# Patient Record
Sex: Female | Born: 1975 | Race: White | Hispanic: No | Smoking: Never smoker
Health system: Southern US, Community
[De-identification: ages and names within clinical notes are randomized; demographics above are authoritative.]

## PROBLEM LIST (undated history)

## (undated) DIAGNOSIS — M1990 Unspecified osteoarthritis, unspecified site: Secondary | ICD-10-CM

## (undated) DIAGNOSIS — R112 Nausea with vomiting, unspecified: Secondary | ICD-10-CM

## (undated) DIAGNOSIS — T7840XA Allergy, unspecified, initial encounter: Secondary | ICD-10-CM

## (undated) DIAGNOSIS — G35 Multiple sclerosis: Secondary | ICD-10-CM

## (undated) DIAGNOSIS — Z9484 Stem cells transplant status: Secondary | ICD-10-CM

## (undated) DIAGNOSIS — Z5189 Encounter for other specified aftercare: Secondary | ICD-10-CM

## (undated) DIAGNOSIS — Z9889 Other specified postprocedural states: Secondary | ICD-10-CM

## (undated) DIAGNOSIS — N3281 Overactive bladder: Secondary | ICD-10-CM

## (undated) DIAGNOSIS — G709 Myoneural disorder, unspecified: Secondary | ICD-10-CM

## (undated) HISTORY — DX: Allergy, unspecified, initial encounter: T78.40XA

## (undated) HISTORY — DX: Unspecified osteoarthritis, unspecified site: M19.90

## (undated) HISTORY — PX: HERNIA REPAIR: SHX51

## (undated) HISTORY — PX: KNEE ARTHROSCOPY W/ MEDIAL COLLATERAL LIGAMENT (MCL) REPAIR: SHX1876

## (undated) HISTORY — DX: Multiple sclerosis: G35

## (undated) HISTORY — DX: Myoneural disorder, unspecified: G70.9

## (undated) HISTORY — DX: Encounter for other specified aftercare: Z51.89

## (undated) HISTORY — PX: TUBAL LIGATION: SHX77

## (undated) HISTORY — PX: OTHER SURGICAL HISTORY: SHX169

---
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
## 2002-11-11 ENCOUNTER — Encounter: Payer: Self-pay | Admitting: Neurology

## 2002-11-11 ENCOUNTER — Ambulatory Visit (HOSPITAL_COMMUNITY): Admission: RE | Admit: 2002-11-11 | Discharge: 2002-11-11 | Payer: Self-pay | Admitting: Neurology

## 2002-11-15 ENCOUNTER — Encounter: Payer: Self-pay | Admitting: Neurology

## 2002-11-15 ENCOUNTER — Encounter: Admission: RE | Admit: 2002-11-15 | Discharge: 2002-11-15 | Payer: Self-pay | Admitting: Neurology

## 2007-01-31 ENCOUNTER — Ambulatory Visit: Payer: Self-pay | Admitting: Obstetrics and Gynecology

## 2008-02-06 ENCOUNTER — Ambulatory Visit: Payer: Self-pay | Admitting: Obstetrics and Gynecology

## 2008-07-22 ENCOUNTER — Encounter: Admission: RE | Admit: 2008-07-22 | Discharge: 2008-07-22 | Payer: Self-pay | Admitting: Neurology

## 2008-07-24 ENCOUNTER — Encounter: Payer: Self-pay | Admitting: Internal Medicine

## 2008-07-24 ENCOUNTER — Ambulatory Visit: Payer: Self-pay | Admitting: Internal Medicine

## 2008-07-24 DIAGNOSIS — R0602 Shortness of breath: Secondary | ICD-10-CM | POA: Insufficient documentation

## 2008-07-31 ENCOUNTER — Telehealth (INDEPENDENT_AMBULATORY_CARE_PROVIDER_SITE_OTHER): Payer: Self-pay | Admitting: Radiology

## 2009-03-17 ENCOUNTER — Encounter: Admission: RE | Admit: 2009-03-17 | Discharge: 2009-03-17 | Payer: Self-pay | Admitting: Neurology

## 2010-03-30 NOTE — Progress Notes (Signed)
Summary: Cancelled Myoview  Phone Note Call from Patient   Caller: Patient Summary of Call: Pt. called and cancelled Stress myoview 07/31/08. She said She didn't need study anymore. Initial call taken by: Lynford Citizen

## 2010-03-30 NOTE — Assessment & Plan Note (Signed)
Summary: [facility]   Referring Provider:  Avie Echevaria   History of Present Illness: 35 y/o woman with h/o morbid obesity and multiple sclerosis referred for further evaluation of CP.  Diagnosed with MS in September 2004. Referred by Dr. Sandria Manly for further evaluation of CP.  2 weeks ago was "fussing at kids" and developed wave of pressure from waist up through chest which dissipated rapidly. The next day had similar episode. Over the past week she feels full and tight through waist and chest. Feels full. Not worse with exertion. No diaphoresis. + SOB. Had CXR which was normal. No cough, fever, chills.  Under lots of stress. Walks every day without problem. Has started weight watchers and lost 30 pounds.  Recently R leg is getting a little heavy and questioning whether she is starting a flare. Wondering if CP may be an "MS hug."   No h/o diabetes, HTN or HL. Does have FHX of CAD.  Current Medications (verified): 1)  Oxybutynin Chloride 5 Mg Tabs (Oxybutynin Chloride) .... Three Times A Day 2)  Rebif 44 Mcg/0.7ml Soln (Interferon Beta-1a) .Marland Kitchen.. 1 Inj Three Times A Week  Allergies (verified): No Known Drug Allergies  Past History:  Past Medical History: 1. Multiple sclerosis    --c/b Tranverse myelopathy and optic neuritis 2. h/o sterile abscess in R abdomen 3. Dystonic bladder  Past Surgical History: c- sections x 2 1999 & 2003 tubes tied hernia repair   Family History: Family History of Cancer:  Family History of Coronary Artery Disease:  Family History of Diabetes:  F had mi x2 at 39,46 now s/p CABG M 59  breast cancer B 31 healthy  Social History: Reviewed history and no changes required. Married  Tobacco Use - No.  Alcohol Use - no Regular Exercise - yes Drug Use - no Smoking Status:  never Does Patient Exercise:  yes Drug Use:  no  Review of Systems       As per HPI and past medical history; otherwise all systems negative.   Vital Signs:  Patient  profile:   35 year old female Height:      67 inches Weight:      245 pounds BMI:     38.51 Pulse rate:   73 / minute BP sitting:   98 / 68  (right arm) Cuff size:   large  Vitals Entered By: Hardin Negus, RMA (Jul 24, 2008 3:32 PM)  Physical Exam  General:  Gen: well appearing. no resp difficulty HEENT: normal Neck: supple. no JVD. Carotids 2+ bilat; not bruits. No lymphadenopathy or thryomegaly appreciated. Cor: PMI nondisplaced. Regular rate & rhythm. No rubs, gallops, murmur. Lungs: clear Abdomen: soft, nontender, nondistended. No hepatosplenomegaly. No bruits or masses. Good bowel sounds. Extremities: no cyanosis, clubbing, rash, edema Neuro: alert & orientedx3, cranial nerves grossly intact. moves all 4 extremities w/o difficulty. affect pleasant    Impression & Recommendations:  Problem # 1:  CHEST TIGHTNESS-PRESSURE-OTHER ([id number]) I suspect that she is having "MS hug" type symptoms but given family history I think it is reasonable to rule out any sturctural heart problems or ischemia. Will plan 2-D echo and treadmill/myoview.   Problem # 2:  DYSPNEA (ICD-786.05) As above.

## 2010-08-19 ENCOUNTER — Encounter: Payer: Self-pay | Admitting: Cardiovascular Disease

## 2011-01-07 ENCOUNTER — Ambulatory Visit: Payer: Self-pay | Admitting: Obstetrics and Gynecology

## 2012-01-25 ENCOUNTER — Ambulatory Visit: Payer: Self-pay | Admitting: Obstetrics and Gynecology

## 2012-05-31 DIAGNOSIS — G35 Multiple sclerosis: Secondary | ICD-10-CM | POA: Insufficient documentation

## 2012-06-01 ENCOUNTER — Encounter: Payer: Self-pay | Admitting: Diagnostic Neuroimaging

## 2012-06-01 ENCOUNTER — Ambulatory Visit (INDEPENDENT_AMBULATORY_CARE_PROVIDER_SITE_OTHER): Payer: BC Managed Care – PPO | Admitting: Diagnostic Neuroimaging

## 2012-06-01 VITALS — BP 91/64 | HR 70 | Temp 97.0°F | Ht 67.0 in | Wt 218.0 lb

## 2012-06-01 DIAGNOSIS — G35 Multiple sclerosis: Secondary | ICD-10-CM

## 2012-06-01 NOTE — Patient Instructions (Addendum)
I will check MRI brain. Then we will consider staying on rebif vs. changing to gilenya or tecfidera.

## 2012-06-01 NOTE — Progress Notes (Signed)
[facility]  PATIENT: Christy Cox DOB: 01/14/1976  REFERRING CLINICIAN:  HISTORY FROM: patient REASON FOR VISIT: follow up   HISTORICAL  CHIEF COMPLAINT:  Chief Complaint  Patient presents with  . Follow-up    annual     HISTORY OF PRESENT ILLNESS:   UPDATE 06/01/12: patient presented for annual followup visit for multiple sclerosis. I reviewed prior notes from my colleague Dr. Sandria Manly. Patient is doing well. She continues on rebf injections, but tells me that she is "tired of injections". We discussed oral MS medications. Patient's last neuroimaging studies were in 2004. She's had a fairly stable course since that time. In January 2014, patient was having some right eye painful symptoms without objective evidence of optic neuritis. Therefore she was not started on steroids. Symptoms last for one week and then resolved. Overall patient doing well. Continues to have some right leg weakness and mild gait difficulty. Her urinary control problems are her most severe symptoms at this time, and she is seeing a urologist for management.  PRIOR HPI (03/09/12; Dr. Sandria Manly): 37 year old right-handed white married female from [place], [name] with multiple sclerosis. 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  She then had an MRI of the brain and of the cervical cord in 05/02/2002 showing multiple lesions in the brain some of which enhanced  and similar lesions in the  cervical spine, none of which enhanced.  She has been on  Rebif since October 2004 and has done very well.  She  had courses of high dose IV Solu-Medrol followed by oral prednisone for numbness in her hands, the last 09/2009.  She  had side effects from the Rebif with a sterile abscess in her right abdomen for which she underwent surgery and had good results.  She  has complained of her feet hurting and numbness in her feet in the past.  She is currently on disability.  She  had an episode of optic neuritis  04/13/2004 evaluated by Dr. Lemar Lofty in [place].  She was off Rebif for about 9 months due to financial issues and restarted in February, 2010. 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 She has stopped using  a tanning bed. Her last MRIs of the cervical spine and brain were 05/02/2002.  In May 2010 she developed chest pain and was evaluated with chest x-ray, 2-D echocardiogram, a 12-lead EKG which were normal. March 03, 2009 she fell in her bedroom  and  fractured her right tibia. She had symptoms of a UTI with frequency and lower abdominal pain. She was initially placed on Cipro for 5 days, but had a resistant organism and then was placed on Macrodantin. She developed elevated liver function tests with jaundice and  a bilirubin of 7.2   She  was seen by Dr. Kinnie Scales who recognized her symptoms were secondary to Macrodantin and her liver dysfunction cleared. Abdominal ultrasound 03/17/2009 showed multiple gallstones. She has nocturia every 1/2 hour and is followed by Dr. Achilles Dunk. She has tried Imipramine and Oxybutynin. She got relief from [employer] which her insurance will not cover.She denies injections problems with Rebif.She had plantar fasciitis on the right  foot.  One week ago she had cold like symptoms. 03/07/2012 she developed right eye pain when moving her eye.  There was no loss of vision.  She treated this with Excedrin.  Today it is not as sore. Her neurologic review systems is otherwise negative.  REVIEW OF SYSTEMS: Full 14 system review of systems performed and notable only for easy bruising, urination problems, lack of sleep.  ALLERGIES: Allergies  Allergen Reactions  . Macrodantin (Nitrofurantoin Macrocrystal)      Caused elevated liver function and jaundice    HOME MEDICATIONS: Outpatient Prescriptions Prior to Visit  Medication Sig Dispense Refill  . interferon beta-1a (REBIF) 44 MCG/0.5ML injection Inject 44 mcg into the skin 3 (three) times a week.        Marland Kitchen oxybutynin (DITROPAN) 5 MG tablet Take 5 mg by mouth 3 (three) times daily.         No facility-administered medications prior to visit.    PAST MEDICAL HISTORY: Past Medical History  Diagnosis Date  . Abscess, abdomen     H/o sterile abscess in right abd  . Bladder disorder, other     Dystonic bladder  . Multiple sclerosis     c/b transverse myelitis and optic neuritis    PAST SURGICAL HISTORY: Past Surgical History  Procedure Laterality Date  . Cesarean section  1999, 2003  . Tubes tied    . Hernia repair      FAMILY HISTORY: Family History  Problem Relation Age of Onset  . Cancer Mother 25    Breast  . Heart attack Father 70    x2 at 56 them at 70, now s/p CABG  . Cancer Other   . Coronary artery disease Other   . Diabetes Other     SOCIAL HISTORY:  History   Social History  . Marital Status: Married    Spouse Name: N/A    Number of Children: 2  . Years of Education: College   Occupational History  . n/a     disabled   Social History Main Topics  . Smoking status: Never Smoker   . Smokeless tobacco: Not on file  . Alcohol Use: No  . Drug Use: No  . Sexually Active: Not on file   Other Topics Concern  . Not on file   Social History Narrative   Pt lives at home with her spouse.    Caffeine Use- 1-2 per week     PHYSICAL EXAM  Filed Vitals:   06/01/12 1059  BP: 91/64  Pulse: 70  Temp: 97 F (36.1 C)  TempSrc: Oral  Height: 5\' 7"  (1.702 m)  Weight: 218 lb (98.884 kg)   Body mass index is 34.14 kg/(m^2).  GENERAL EXAM: Patient is in no distress  CARDIOVASCULAR: Regular rate and rhythm, no murmurs, no carotid bruits  NEUROLOGIC: MENTAL STATUS: awake, alert, language fluent,  comprehension intact, naming intact CRANIAL NERVE: no papilledema on fundoscopic exam, pupils equal and reactive to light, visual fields full to confrontation, extraocular muscles intact, no nystagmus, facial sensation and strength symmetric, uvula midline, shoulder shrug symmetric, tongue midline. MOTOR: normal bulk and tone, full strength in the BUE, LLE; RIGHT KNEE FLEX 4/5, ANKLE DF 4/5.  SENSORY: normal and symmetric to light touch, pinprick, temperature, vibration COORDINATION: finger-nose-finger, fine finger movements normal REFLEXES: deep tendon reflexes present and symmetric GAIT/STATION: narrow based gait; SLIGHT LIMP WITH RIGHT LEG.   DIAGNOSTIC DATA (LABS, IMAGING, TESTING) - I reviewed patient records, labs, notes, testing and imaging  myself where available.  No results found for this basename: WBC, HGB, HCT, MCV, PLT   No results found for this basename: na, k, cl, co2, glucose, bun, creatinine, calcium, prot, albumin, ast, alt, alkphos, bilitot, gfrnonaa, gfraa   No results found for this basename: CHOL, HDL, LDLCALC, LDLDIRECT, TRIG, CHOLHDL   No results found for this basename: HGBA1C   No results found for this basename: VITAMINB12   No results found for this basename: TSH     ASSESSMENT AND PLAN  37 y.o. year old female  has a past medical history of Abscess, abdomen; Bladder disorder, other; and Multiple sclerosis. here for follow up. I will check MRI of the brain to establish new baseline imaging. Last imaging was done at [facility], so we will try to obtain old films for comparison. Imaging appears stable, we may continued her on rebif. If there is significant progression, or patient decides to change medications, I would recommend Gilenya or tecfidera.   Orders Placed This Encounter  Procedures  . MR Brain W Wo Contrast    Suanne Marker, MD 06/01/2012, 11:17 AM Certified in Neurology, Neurophysiology and Neuroimaging  [facility] [address] [place], [name] [address] [phone]

## 2012-06-06 DIAGNOSIS — N390 Urinary tract infection, site not specified: Secondary | ICD-10-CM | POA: Insufficient documentation

## 2012-06-29 ENCOUNTER — Other Ambulatory Visit: Payer: Self-pay | Admitting: Diagnostic Neuroimaging

## 2012-08-01 ENCOUNTER — Ambulatory Visit (INDEPENDENT_AMBULATORY_CARE_PROVIDER_SITE_OTHER): Payer: BC Managed Care – PPO

## 2012-08-01 ENCOUNTER — Other Ambulatory Visit: Payer: Self-pay

## 2012-08-01 DIAGNOSIS — G35 Multiple sclerosis: Secondary | ICD-10-CM

## 2012-08-01 MED ORDER — GABAPENTIN 100 MG PO CAPS: 200.0000 mg | ORAL_CAPSULE | Freq: Every evening | ORAL | Status: DC

## 2012-08-02 MED ORDER — GADOPENTETATE DIMEGLUMINE 469.01 MG/ML IV SOLN: 20.0000 mL | Freq: Once | INTRAVENOUS | Status: AC | PRN

## 2012-08-06 ENCOUNTER — Telehealth: Payer: Self-pay | Admitting: Diagnostic Neuroimaging

## 2012-08-06 NOTE — Telephone Encounter (Signed)
I called pt and let her know the results of MRI.  She would like to speak to Dr. Marjory Lies.   I do not know about looking at old films (2004), if they are even here.   She may inquire at [facility], if they have 2004.  I relayed about changing to new medication (gilenya or tecfidera).

## 2012-08-07 NOTE — Telephone Encounter (Signed)
I called patient and reviewed options. She will think about it. -VRP

## 2012-10-16 ENCOUNTER — Other Ambulatory Visit: Payer: Self-pay

## 2012-10-16 MED ORDER — INTERFERON BETA-1A 44 MCG/0.5ML ~~LOC~~ SOLN: 44.0000 ug | SUBCUTANEOUS | Status: DC

## 2012-10-31 ENCOUNTER — Telehealth: Payer: Self-pay | Admitting: Diagnostic Neuroimaging

## 2012-11-01 NOTE — Telephone Encounter (Signed)
Patient states from her waist down she feels "heavy" or numb. The feeling is more so on her R side. She states when she had these episodes in the past Dr. Sandria Manly would recommend steroids. Patient is unable to come in for OV today because leaving to go out of town. Will not return until Sunday. Would like to notify Dr. Marjory Lies and ask for advice.

## 2012-11-05 ENCOUNTER — Telehealth: Payer: Self-pay | Admitting: Diagnostic Neuroimaging

## 2012-11-05 NOTE — Telephone Encounter (Signed)
I talked to patient on call re: numbness from waist down since last week, she states, she called last week, but has not heard back and she feels, w each day, she is getting worse. She has numbness and mild weakness, but can still walk. Her legs feel heavy and she has trouble putting pants on. She is afraid of falling, but has not fallen. I explained to her she would likely need another MRI of the T and L spine, and that she could either go to the ER at [facility], where she would likely get a scan and possible treatment with steroids if indicated. She could also wait till AM and I would get my message to Dr. Demetrius Charity and also discuss plan of action with him in AM. She stated, that she could wait another 12 hours, as she has waited so long, however, she is advised to go to the ER or call 911, if she had sudden loss or worsening of motor function. She demonstrated understanding and voiced agreement. She was upset, that she had not heard back from the office in days, but was grateful for the call back after hours. I reassured her that I would talk to Dr. Demetrius Charity in AM.

## 2012-11-06 NOTE — Telephone Encounter (Signed)
Patient scheduled to see Heide Guile, NP tomorrow at 9:00 a.m.

## 2012-11-06 NOTE — Telephone Encounter (Signed)
I called patient. More numbness, weakness, waist down. She will come in tomorrow at 9am, on Lynn's schedule. Possible labs, IV steroids and MRI planned. -VRP

## 2012-11-07 ENCOUNTER — Ambulatory Visit (INDEPENDENT_AMBULATORY_CARE_PROVIDER_SITE_OTHER): Payer: BC Managed Care – PPO | Admitting: Nurse Practitioner

## 2012-11-07 ENCOUNTER — Ambulatory Visit (INDEPENDENT_AMBULATORY_CARE_PROVIDER_SITE_OTHER): Payer: BC Managed Care – PPO | Admitting: *Deleted

## 2012-11-07 ENCOUNTER — Telehealth: Payer: Self-pay | Admitting: Diagnostic Neuroimaging

## 2012-11-07 ENCOUNTER — Encounter: Payer: Self-pay | Admitting: Nurse Practitioner

## 2012-11-07 VITALS — BP 113/82 | HR 112 | Temp 98.1°F | Ht 67.0 in | Wt 219.0 lb

## 2012-11-07 DIAGNOSIS — G35 Multiple sclerosis: Secondary | ICD-10-CM

## 2012-11-07 MED ORDER — SODIUM CHLORIDE 0.9 % IV SOLN
1000.0000 mg | INTRAVENOUS | Status: DC
  Administered 2012-11-07: 1000 mg via INTRAVENOUS

## 2012-11-07 MED ORDER — PREDNISONE 10 MG PO TABS: 10.0000 mg | ORAL_TABLET | Freq: Every day | ORAL | Status: DC

## 2012-11-07 NOTE — Patient Instructions (Addendum)
SoluMedrol infusion today and then x 4 days at home. Prednisone Oral Taper for 12 days:  Take 1 tablet (10 mg total) by mouth daily. 12 day Taper. Day 1-2, 60 mg (6 tabs), Day 3-4, 50 mg (5 tabs), Day 5-6, 40 mg (4 tabs), Days 7-8 30 mg (3 tabs), Days 9-10, 20 mg (2 Tabs), Days 11-12, 10 mg (1 Tab).   Please supplement your diet with Potassium rich foods while on prednisone.  Add a banana each day. Take an over-the-counter antacid such as Prevacid daily also.  We will order MRI of Brain (Repeat) and MRI Thoracic Spine. CBC, CMP and JC Virus Antibody, Urinalysis today.  Follow up as scheduled.

## 2012-11-07 NOTE — Telephone Encounter (Signed)
Patient also called and left a message on my VM.  The nurses do the IV Solumedrol.  Andrey Campanile has sent the order to [facility] for home infusion.  She is contacting the patient to discuss this matter.

## 2012-11-07 NOTE — Progress Notes (Signed)
Pt here for appt with Heide Guile, NP/ Dr. Marjory Lies.  Order for Solumedrol 1000mg  IV daily for 5 days.   Will give first dose today in office.   Under aseptic technique 22g angiocath inserted to R inner forearm with good blood return.  (some bruising noted).  IVF ( 0.9% NS / 1000mg  Solumedrol) infusion started at 1043.  Pt made comfortable , reclining chair, lights dimmed.   Pt anxious and did experience some feelings of nausea.  These did subside after a while.  She is very anxious re: IV's .  Monitored her IV site, which remained soft, warm, no c/o pain.  IV finished 1043, flushed with NS , and NSL im place.  Taped and wrapped with kerlix and self adherenct bandage.  Pt instructed that if too loose or tight can remove and then replace.   Wrap with cellophane wrap for shower and try to keep dry.  To use this for next day's infusions.  Pt verbalized instructions.

## 2012-11-07 NOTE — Patient Instructions (Signed)
Pt to continue IV infusions Day 2 thru Day 5 via [facility] - [facility].

## 2012-11-07 NOTE — Progress Notes (Signed)
[facility]  PATIENT: Christy Cox DOB: 1975/05/24   REASON FOR VISIT: follow up HISTORY FROM: patient  HISTORY OF PRESENT ILLNESS: UPDATE 11/07/12 (LL): Patient calls for acute visit.  She has been experiencing increased lower extremity weakness from the waist down since last Wednesday.  He arms have not been affected.  She denies recent illness, fevers, UTI symptoms, or cold symptoms.  She denies falling but feels very unsteady.  UPDATE 06/01/12 (VRP): patient presented for annual followup visit for multiple sclerosis. I reviewed prior notes from my colleague Dr. Sandria Cox. Patient is doing well. She continues on rebf injections, but tells me that she is "tired of injections". We discussed oral MS medications. Patient's last neuroimaging studies were in 2004. She's had a fairly stable course since that time. In January 2014, patient was having some right eye painful symptoms without objective evidence of optic neuritis. Therefore she was not started on steroids. Symptoms last for one week and then resolved. Overall patient doing well. Continues to have some right leg weakness and mild gait difficulty. Her urinary control problems are her most severe symptoms at this time, and she is seeing a urologist for management.   PRIOR HPI (03/09/12; Dr. Sandria Cox): 37 year old right-handed white married female from [place], [name] with multiple sclerosis. 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 She then had an MRI of the brain and of the cervical cord in 05/02/2002 showing multiple lesions in the brain some of which enhanced and similar lesions in the cervical spine, none of which enhanced. She has been on Rebif since October 2004 and has done very well. She had courses of high dose IV Solu-Medrol followed by oral prednisone for numbness  in her hands, the last 09/2009. She had side effects from the Rebif with a sterile abscess in her right abdomen for which she underwent surgery and had good results. She has complained of her feet hurting and numbness in her feet in the past. She is currently on disability. She had an episode of optic neuritis 04/13/2004 evaluated by Dr. Lemar Cox in [place]. She was off Rebif for about 9 months due to financial issues and restarted in February, 2010. 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 She has stopped using a tanning bed. Her last MRIs of the cervical spine and brain were 05/02/2002. In May 2010 she developed chest pain and was evaluated with chest x-ray, 2-D echocardiogram, a 12-lead EKG which were normal. March 03, 2009 she fell in her bedroom and fractured her right tibia. She had symptoms of a UTI with frequency and lower abdominal pain. She was initially placed on Cipro for 5 days, but had a resistant organism and then was placed on Macrodantin. She developed elevated liver function tests with jaundice and a bilirubin of 7.2  She was seen by Dr. Kinnie Cox who recognized her symptoms were secondary to Macrodantin and her liver dysfunction cleared. Abdominal ultrasound 03/17/2009 showed multiple gallstones. She has nocturia every 1/2 hour and is followed by Dr. Achilles Cox. She has tried Imipramine and Oxybutynin. She got relief from VESIcare which her insurance will not cover.She denies injections problems with Rebif.She had plantar  fasciitis on the right foot. One week ago she had cold like symptoms. 03/07/2012 she developed right eye pain when moving her eye. There was no loss of vision. She treated this with Excedrin. Today it is not as sore. Her neurologic review systems is otherwise negative.  REVIEW OF SYSTEMS: Full 14 system review of systems performed and notable only  for:  Constitutional: N/A  Cardiovascular: N/A  Ear/Nose/Throat: N/A  Skin: N/A  Eyes: N/A  Respiratory: N/A  Gastroitestinal: N/A  Genitourinary: N/A Hematology/Lymphatic: N/A  Endocrine: N/A Musculoskeletal:N/A  Allergy/Immunology: N/A  Neurological: numbness, weakness Psychiatric: N/A Sleep: insomnia   ALLERGIES: Allergies  Allergen Reactions  . Macrodantin [Nitrofurantoin Macrocrystal]     Caused elevated liver function and jaundice    HOME MEDICATIONS: Outpatient Prescriptions Prior to Visit  Medication Sig Dispense Refill  . cyclobenzaprine (FLEXERIL) 5 MG tablet TAKE 1 TABLET AT BEDTIME  30 tablet  5  . gabapentin (NEURONTIN) 100 MG capsule Take 2 capsules (200 mg total) by mouth every evening.  60 capsule  6  . imipramine (TOFRANIL) 25 MG tablet Take 25 mg by mouth at bedtime.      . interferon beta-1a (REBIF) 44 MCG/0.5ML injection Inject 0.5 mL (44 mcg total) into the skin 3 (three) times a week.  18 mL  0  . oxybutynin (DITROPAN) 5 MG tablet Take 5 mg by mouth 3 (three) times daily.        . solifenacin (VESICARE) 10 MG tablet Take by mouth daily.       No facility-administered medications prior to visit.    PAST MEDICAL HISTORY: Past Medical History  Diagnosis Date  . Abscess, abdomen     H/o sterile abscess in right abd  . Bladder disorder, other     Dystonic bladder  . Multiple sclerosis     c/b transverse myelitis and optic neuritis    PAST SURGICAL HISTORY: Past Surgical History  Procedure Laterality Date  . Cesarean section  1999, 2003  . Tubes tied    . Hernia repair      FAMILY HISTORY: Family History  Problem Relation Age of Onset  . Cancer Mother 33    Breast  . Heart attack Father 91    x2 at 32 them at 45, now s/p CABG  . Cancer Other   . Coronary artery disease Other   . Diabetes Other     SOCIAL HISTORY: History   Social History  . Marital Status: Married    Spouse Name: dennis    Number of Children: 2  . Years of  Education: College   Occupational History  . n/a     disabled   Social History Main Topics  . Smoking status: Never Smoker   . Smokeless tobacco: Not on file  . Alcohol Use: No  . Drug Use: No  . Sexual Activity: Not on file   Other Topics Concern  . Not on file   Social History Narrative   Pt lives at home with her spouse.    Caffeine Use- 1-2 per week     PHYSICAL EXAM  Filed Vitals:   11/07/12 0916  BP: 113/82  Pulse: 112  Temp: 98.1 F (36.7 C)  TempSrc: Oral  Height: 5\' 7"  (1.702 m)  Weight: 219 lb (99.338 kg)   Body mass index is 34.29 kg/(m^2).  Generalized: Well developed, in no acute distress  Head: normocephalic and atraumatic. Oropharynx benign  Neck: Supple, no carotid bruits  Cardiac: Regular rate rhythm,  no murmur  Musculoskeletal: No deformity   NEUROLOGIC:  MENTAL STATUS: awake, alert, language fluent, comprehension intact, naming intact  CRANIAL NERVE: no papilledema on fundoscopic exam, pupils equal and reactive to light, visual fields full to confrontation, extraocular muscles intact, no nystagmus, facial sensation and strength symmetric, uvula midline, shoulder shrug symmetric, tongue midline.  MOTOR: normal bulk and tone, full strength in the BUE, LLE; RIGHT KNEE FLEX 3/5, ANKLE DF 4/5.  SENSORY: NUMBNESS AND TINGLING FROM T5 LEVEL DOWN.  COORDINATION: finger-nose-finger, fine finger movements normal  REFLEXES: deep tendon reflexes present and symmetric  GAIT/STATION: narrow based gait; SLIGHT LIMP WITH RIGHT LEG.  ROMBERG POSITIVE. TANDEM UNSTEADY.  DIAGNOSTIC DATA (LABS, IMAGING, TESTING) - I reviewed patient records, labs, notes, testing and imaging myself where available.  08/03/12 MRI BRAIN W/Wo Abnormal MRI brain (with and without) demonstrating:Multiple round and ovoid, periventricular, subcortical, juxtacortical and right middle cerebellar peduncle chronic demyelinating plaques. There is a right mesial temporal enhancing lesion, not  seen on axial views, and may be acute plaque vs. artifact.   ASSESSMENT AND PLAN 37 y.o. year old female has a past medical history of Abscess, abdomen; Bladder disorder, and Multiple sclerosis here for worsening LE weakness and numbness. I will check MRI of the T and L spine to establish new baseline imaging. Last imaging was done at [facility], have not been able to obtain old films for comparison. If imaging appears stable, we may continue her on rebif. If there is significant progression, or patient decides to change medications, I would recommend Gilenya or tecfidera, possibly Tysabri.   PLAN: 1. Check CBC, CMP, UA, JCV antibody. 2. SoluMedrol 1000mg  IV today followed by 4 days by [facility], 1000 mg daily. 3. Prednisone 12 day oral taper following completion of IV SoluMedrol. 4. MRI Brain and Tspine, W/Wo 5. Supplement diet with extra Potassium daily and OTC Antacid (eg. Prevacid) daily while on Prednisone.  Meds ordered this encounter  Medications  . predniSONE (DELTASONE) 10 MG tablet    Sig: Take 1 tablet (10 mg total) by mouth daily. 12 day Taper.  Day 1-2, 60 mg (6 tabs), Day 3-4, 50 mg (5 tabs), Day 5-6, 40 mg (4 tabs), Days 7-8 30 mg (3 tabs), Days 9-10, 20 mg (2 Tabs), Days 11-12, 10 mg (1 Tab).    Dispense:  42 tablet    Refill:  0    Order Specific Question:  Supervising Provider    Answer:  Joycelyn Schmid R [[id number]]   Orders Placed This Encounter  Procedures  . MR Thoracic Spine W Wo Contrast  . MR Brain W Wo Contrast  . CBC with Differential  . CMP  . Urinalysis with Reflex Microscopic  . Stratify JCV Antibody Test ([facility])   Plan of care discussed with Dr. Joycelyn Schmid, MD at time of visit.    Ronal Fear, MSN, NP-C 11/07/2012, 10:24 AM [facility] [address] [place], [name] [address] [phone]

## 2012-11-08 ENCOUNTER — Telehealth: Payer: Self-pay | Admitting: Diagnostic Neuroimaging

## 2012-11-08 NOTE — Telephone Encounter (Signed)
I called pt after speaking to [place] in pharmacy , [facility].  They are not able to accomodate pt at this time.  I relayed this to pt and also called another [facility] agency and pt to have [facility] to assist.  I faxed orders and referral to them.  [phone] after talking to them.  [phone].  Pt informed that will continue with infusion as ordered (4 days).  She verbalized understanding.

## 2012-11-09 NOTE — Telephone Encounter (Signed)
I have already taken care of this from later notes.

## 2012-11-12 NOTE — Telephone Encounter (Signed)
Received call early this am 11-09-12 from [facility] that they had spoken to pt and she had received medications from [facility].  So they are not needed.  Late Entry.

## 2012-11-13 ENCOUNTER — Other Ambulatory Visit: Payer: Self-pay | Admitting: Diagnostic Neuroimaging

## 2012-11-13 DIAGNOSIS — G35 Multiple sclerosis: Secondary | ICD-10-CM

## 2012-11-29 ENCOUNTER — Ambulatory Visit (INDEPENDENT_AMBULATORY_CARE_PROVIDER_SITE_OTHER): Payer: BC Managed Care – PPO

## 2012-11-29 ENCOUNTER — Other Ambulatory Visit (INDEPENDENT_AMBULATORY_CARE_PROVIDER_SITE_OTHER): Payer: Self-pay

## 2012-11-29 DIAGNOSIS — G35 Multiple sclerosis: Secondary | ICD-10-CM

## 2012-11-29 DIAGNOSIS — G35D Multiple sclerosis, unspecified: Secondary | ICD-10-CM

## 2012-11-29 DIAGNOSIS — Z0289 Encounter for other administrative examinations: Secondary | ICD-10-CM

## 2012-11-29 LAB — URINALYSIS, ROUTINE W REFLEX MICROSCOPIC
Bilirubin, UA: NEGATIVE
Glucose, UA: NEGATIVE
Ketones, UA: NEGATIVE
Leukocytes, UA: NEGATIVE
Nitrite, UA: POSITIVE — AB
Protein, UA: NEGATIVE
Specific Gravity, UA: 1.01 (ref 1.005–1.030)
Urobilinogen, Ur: 0.2 mg/dL (ref 0.0–1.9)
pH, UA: 8.5 — ABNORMAL HIGH (ref 5.0–7.5)

## 2012-11-29 LAB — CBC WITH DIFFERENTIAL/PLATELET
Basophils Absolute: 0 10*3/uL (ref 0.0–0.2)
Basos: 0 %
Eos: 2 %
Eosinophils Absolute: 0.1 10*3/uL (ref 0.0–0.4)
HCT: 34.8 % (ref 34.0–46.6)
Hemoglobin: 11.8 g/dL (ref 11.1–15.9)
Lymphocytes Absolute: 0.9 10*3/uL (ref 0.7–3.1)
Lymphs: 21 %
MCH: 30.6 pg (ref 26.6–33.0)
MCHC: 33.9 g/dL (ref 31.5–35.7)
MCV: 90 fL (ref 79–97)
Monocytes Absolute: 0.5 10*3/uL (ref 0.1–0.9)
Monocytes: 11 %
Neutrophils Absolute: 2.6 10*3/uL (ref 1.4–7.0)
Neutrophils Relative %: 66 %
RBC: 3.86 x10E6/uL (ref 3.77–5.28)
RDW: 14.5 % (ref 12.3–15.4)
WBC: 4 10*3/uL (ref 3.4–10.8)

## 2012-11-29 LAB — MICROSCOPIC EXAMINATION: RBC, UA: NONE SEEN /hpf (ref 0–?)

## 2012-11-29 LAB — COMPREHENSIVE METABOLIC PANEL
ALT: 40 IU/L — ABNORMAL HIGH (ref 0–32)
AST: 37 IU/L (ref 0–40)
Albumin/Globulin Ratio: 1.7 (ref 1.1–2.5)
Albumin: 3.8 g/dL (ref 3.5–5.5)
Alkaline Phosphatase: 56 IU/L (ref 39–117)
BUN/Creatinine Ratio: 16 (ref 8–20)
BUN: 10 mg/dL (ref 6–20)
CO2: 27 mmol/L (ref 18–29)
Calcium: 8.6 mg/dL — ABNORMAL LOW (ref 8.7–10.2)
Chloride: 103 mmol/L (ref 96–108)
Creatinine, Ser: 0.61 mg/dL (ref 0.57–1.00)
GFR calc Af Amer: 134 mL/min/{1.73_m2} (ref >59)
GFR calc non Af Amer: 116 mL/min/{1.73_m2} (ref >59)
Globulin, Total: 2.3 g/dL (ref 1.5–4.5)
Glucose: 79 mg/dL (ref 65–99)
Potassium: 4 mmol/L (ref 3.5–5.2)
Sodium: 138 mmol/L (ref 134–144)
Total Bilirubin: 0.5 mg/dL (ref 0.0–1.2)
Total Protein: 6.1 g/dL (ref 6.0–8.5)

## 2012-11-29 MED ORDER — GADOPENTETATE DIMEGLUMINE 469.01 MG/ML IV SOLN: 20.0000 mL | Freq: Once | INTRAVENOUS | Status: AC | PRN

## 2012-12-11 ENCOUNTER — Telehealth: Payer: Self-pay | Admitting: *Deleted

## 2012-12-11 NOTE — Telephone Encounter (Signed)
Pt calling for MRI results.   I called and gave her the results of brain and thoracic MRI's.  She asked about the results and her exacerbation (would these show up).  She wanted to know how many plaques present?  She is about 60% improved from her baseline.  Still has waist/bila leg heavy feeling, hard time walking.  Numbness gone.  She is still taking her rebif.  JCV result?  Please advise.

## 2012-12-21 ENCOUNTER — Ambulatory Visit: Payer: Self-pay | Admitting: Diagnostic Neuroimaging

## 2012-12-21 ENCOUNTER — Ambulatory Visit (INDEPENDENT_AMBULATORY_CARE_PROVIDER_SITE_OTHER): Payer: BC Managed Care – PPO | Admitting: Diagnostic Neuroimaging

## 2012-12-21 ENCOUNTER — Encounter: Payer: Self-pay | Admitting: Diagnostic Neuroimaging

## 2012-12-21 VITALS — BP 99/67 | HR 100 | Temp 98.5°F | Ht 67.0 in | Wt 229.0 lb

## 2012-12-21 DIAGNOSIS — G35 Multiple sclerosis: Secondary | ICD-10-CM

## 2012-12-21 NOTE — Progress Notes (Signed)
[facility]  PATIENT: Christy Cox DOB: [birth year]   REASON FOR VISIT: follow up HISTORY FROM: patient  HISTORY OF PRESENT ILLNESS:  UPDATE 12/21/12: Since last visit patient has had improvement in her lower extremity weakness and numbness sensation, following IV steroid treatment. MRI of the brain and thoracic spine were reviewed and are unremarkable compared to prior studies. JC virus antibody testing was negative. Patient is now contemplating switching to Gilenya or Tecfidera. She tolerates Rebif without difficulty, but does feel like she is getting tired of doing injections.  UPDATE 11/07/12 (LL): Patient calls for acute visit.  She has been experiencing increased lower extremity weakness from the waist down since last Wednesday.  He arms have not been affected.  She denies recent illness, fevers, UTI symptoms, or cold symptoms.  She denies falling but feels very unsteady.  UPDATE 06/02/35 (VRP): patient presented for annual followup visit for multiple sclerosis. I reviewed prior notes from my colleague Dr. Sandria Manly. Patient is doing well. She continues on rebf injections, but tells me that she is "tired of injections". We discussed oral MS medications. Patient's last neuroimaging studies were in 2004. She's had a fairly stable course since that time. In January 2014, patient was having some right eye painful symptoms without objective evidence of optic neuritis. Therefore she was not started on steroids. Symptoms last for one week and then resolved. Overall patient doing well. Continues to have some right leg weakness and mild gait difficulty. Her urinary control problems are her most severe symptoms at this time, and she is seeing a urologist for management.   PRIOR HPI (03/09/12; Dr. Sandria Manly): 37 year old right-handed white married female from [place], [name] with multiple sclerosis. She initially had symptoms in 2003 of transverse myelopathy with levels to T9 on the left and T7  on the right and abnormalities of the cord at C4-C5 and C6-C7 and T6-T7 on the right and at T7-T8 on the left. She then had an MRI of the brain and of the cervical cord in 05/02/2002 showing multiple lesions in the brain some of which enhanced and similar lesions in the cervical spine, none of which enhanced. She has been on Rebif since October 2004 and has done very well. She had courses of high dose IV Solu-Medrol followed by oral prednisone for numbness in her hands, the last 09/2009. She had side effects from the Rebif with a sterile abscess in her right abdomen for which she underwent surgery and had good results. She has complained of her feet hurting and numbness in her feet in the past. She is currently on disability. She had an episode of optic neuritis 04/13/2004 evaluated by Dr. Lemar Lofty in [place]. She was off Rebif for about 9 months due to financial issues and restarted in February, 2010. 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 She has stopped using a tanning bed. Her last MRIs of the cervical spine and brain were 05/02/2002. In May 2010 she developed chest pain and was evaluated with chest x-ray, 2-D echocardiogram, a 12-lead EKG which were normal. March 03, 2009 she fell in her bedroom and fractured her right tibia. She had symptoms of a UTI with frequency and lower abdominal pain. She was initially placed on Cipro for 5 days, but had a resistant organism and then was placed on Macrodantin. She developed elevated liver function  tests with jaundice and a bilirubin of 7.2. She was seen by Dr. Kinnie Scales who recognized her symptoms were secondary to Macrodantin and her liver dysfunction cleared. Abdominal ultrasound 03/17/2009 showed multiple gallstones. She has nocturia every 1/2 hour and is followed by Dr. Achilles Dunk. She has tried Imipramine and Oxybutynin. She got relief  from VESIcare which her insurance will not cover.She denies injections problems with Rebif.She had plantar fasciitis on the right foot. One week ago she had cold like symptoms. 03/07/2012 she developed right eye pain when moving her eye. There was no loss of vision. She treated this with Excedrin. Today it is not as sore. Her neurologic review systems is otherwise negative.  REVIEW OF SYSTEMS: Full 14 system review of systems performed and notable only for: Numbness and balance difficulty. Otherwise negative.  ALLERGIES: Allergies  Allergen Reactions  . Macrodantin [Nitrofurantoin Macrocrystal]     Caused elevated liver function and jaundice    HOME MEDICATIONS: Outpatient Prescriptions Prior to Visit  Medication Sig Dispense Refill  . imipramine (TOFRANIL) 25 MG tablet Take 25 mg by mouth at bedtime.      . interferon beta-1a (REBIF) 44 MCG/0.5ML injection Inject 0.5 mL (44 mcg total) into the skin 3 (three) times a week.  18 mL  0  . oxybutynin (DITROPAN) 5 MG tablet Take 5 mg by mouth 3 (three) times daily.        . cyclobenzaprine (FLEXERIL) 5 MG tablet TAKE 1 TABLET AT BEDTIME  30 tablet  5  . gabapentin (NEURONTIN) 100 MG capsule Take 2 capsules (200 mg total) by mouth every evening.  60 capsule  6  . predniSONE (DELTASONE) 10 MG tablet Take 1 tablet (10 mg total) by mouth daily. 12 day Taper.  Day 1-2, 60 mg (6 tabs), Day 3-4, 50 mg (5 tabs), Day 5-6, 40 mg (4 tabs), Days 7-8 30 mg (3 tabs), Days 9-10, 20 mg (2 Tabs), Days 11-12, 10 mg (1 Tab).  42 tablet  0   Facility-Administered Medications Prior to Visit  Medication Dose Route Frequency Provider Last Rate Last Dose  . methylPREDNISolone sodium succinate (SOLU-MEDROL) 1,000 mg in sodium chloride 0.9 % 100 mL IVPB  1,000 mg Intravenous Continuous Suanne Marker, MD   1,000 mg at 11/07/12 1043    PAST MEDICAL HISTORY: Past Medical History  Diagnosis Date  . Abscess, abdomen     H/o sterile abscess in right abd  . Bladder  disorder, other     Dystonic bladder  . Multiple sclerosis     c/b transverse myelitis and optic neuritis    PAST SURGICAL HISTORY: Past Surgical History  Procedure Laterality Date  . Cesarean section  1999, 2003  . Tubes tied    . Hernia repair      FAMILY HISTORY: Family History  Problem Relation Age of Onset  . Cancer Mother 2    Breast  . Heart attack Father 42    x2 at 33 them at 4, now s/p CABG  . Cancer Other   . Coronary artery disease Other   . Diabetes Other     SOCIAL HISTORY: History   Social History  . Marital Status: Married    Spouse Name: dennis    Number of Children: 2  . Years of Education: College   Occupational History  . n/a     disabled   Social History Main Topics  . Smoking status: Never Smoker   . Smokeless tobacco: Never Used  . Alcohol Use:  No  . Drug Use: No  . Sexual Activity: Not on file   Other Topics Concern  . Not on file   Social History Narrative   Pt lives at home with her spouse.    Caffeine Use- 1-2 per week     PHYSICAL EXAM  Filed Vitals:   12/21/12 0955  BP: 99/67  Pulse: 100  Temp: 98.5 F (36.9 C)  TempSrc: Oral  Height: 5\' 7"  (1.702 m)  Weight: 229 lb (103.874 kg)   Body mass index is 35.86 kg/(m^2).  GENERAL EXAM: Patient is in no distress  CARDIOVASCULAR: Regular rate and rhythm, no murmurs, no carotid bruits  NEUROLOGIC: MENTAL STATUS: awake, alert, language fluent, comprehension intact, naming intact CRANIAL NERVE: no papilledema on fundoscopic exam, pupils equal and reactive to light, visual fields full to confrontation, extraocular muscles intact, no nystagmus, facial sensation and strength symmetric, uvula midline, shoulder shrug symmetric, tongue midline. MOTOR: normal bulk and tone, full strength in the BUE, BLE SENSORY: normal and symmetric to light touch COORDINATION: finger-nose-finger, fine finger movements, heel-shin normal REFLEXES: deep tendon reflexes present, 2+ and  symmetric GAIT/STATION: narrow based gait; able to walk on toes and tandem; DIFF WITH HEEL GAIT. Romberg is negative   DIAGNOSTIC DATA (LABS, IMAGING, TESTING) - I reviewed patient records, labs, notes, testing and imaging myself where available.  08/03/12 MRI BRAIN w/wo Abnormal MRI brain (with and without) demonstrating:Multiple round and ovoid, periventricular, subcortical, juxtacortical and right middle cerebellar peduncle chronic demyelinating plaques. There is a right mesial temporal enhancing lesion, not seen on axial views, and may be acute plaque vs. artifact.   11/29/12 MRI BRAIN w/wo - multiple periventricular, periatrial, subcortical, corpus callosal, cerebellar and brainstem white matter hyperintensity is quite typical for demyelinating disease - No enhancing lesions are noted. - no change from MRI on 08/03/12  11/29/12 MRI thoracic spine w/wo - Normal. No definite demyelinating lesions are noted.  JCV antibody (stratify) - 0.20 (NEGATIVE)   ASSESSMENT AND PLAN 37 y.o. year old female with multiple sclerosis, on rebif. Last exacerbation well controlled with steroid treatment. MRI's are stable. I reviewed imaging with patient at my reading station. Will proceed with consideration for transition to gilenya or tecfidera.    PLAN: 1. Continue rebif for now; patient will think about transition to gilenya or tecfidera.  Return in about 6 months (around 06/21/2013).   Suanne Marker, MD 12/21/2012, 10:55 AM Certified in Neurology, Neurophysiology and Neuroimaging  [facility] [address] [place], [name] [address] [phone]

## 2012-12-21 NOTE — Patient Instructions (Signed)
Consider tecfidera or gilenya.

## 2013-01-03 ENCOUNTER — Other Ambulatory Visit: Payer: Self-pay

## 2013-01-03 MED ORDER — INTERFERON BETA-1A 44 MCG/0.5ML ~~LOC~~ SOLN: 44.0000 ug | SUBCUTANEOUS | Status: DC

## 2013-01-18 ENCOUNTER — Telehealth: Payer: Self-pay | Admitting: Diagnostic Neuroimaging

## 2013-01-18 NOTE — Telephone Encounter (Signed)
Please advise 

## 2013-01-28 NOTE — Telephone Encounter (Signed)
Per Dr. Marjory Lies 57mo the first year.  Form for tecfidera to be faxed to her mother's #, then faxed back. Pt verbalized understanding.

## 2013-01-28 NOTE — Telephone Encounter (Signed)
Please initiate process for starting tecfidera. Can stop rebif 2 weeks before first dose of tecfidera.  Suanne Marker, MD 01/28/2013, 9:30 AM Certified in Neurology, Neurophysiology and Neuroimaging  [facility] [address] [place], [name] [address] [phone]

## 2013-01-28 NOTE — Telephone Encounter (Signed)
I called pt and she asked about the lab draws when taking tecfidera/  (after reading and talking with others) 3 mo?

## 2013-02-08 ENCOUNTER — Telehealth: Payer: Self-pay

## 2013-02-08 NOTE — Telephone Encounter (Signed)
[employer] sent [place] a letter saying they have approved our request for coverage on Tecfidera effective 01/08/2013 until 02/07/2016 Ref # [id number]

## 2013-03-26 ENCOUNTER — Telehealth: Payer: Self-pay | Admitting: Diagnostic Neuroimaging

## 2013-03-26 DIAGNOSIS — G35 Multiple sclerosis: Secondary | ICD-10-CM

## 2013-03-26 NOTE — Telephone Encounter (Signed)
Patient is requesting labs for tecfidera,started (02/27/13). Wants to know if labs  should be done monthly for the first 6 mos.and every 3 months thereafter.  She is concerned about having them done only every three months for white blood cell count. She would like to have the labs mailed to her so she may take to [facility] in [place].

## 2013-03-26 NOTE — Telephone Encounter (Signed)
Patient  needs prescription for lab work. Please call to advise.

## 2013-03-28 NOTE — Telephone Encounter (Signed)
I have tried to call patient but couldn't leave a message because mailbox is full.  I spoke to Dr. Leta Baptist and we will put labs in once a month for 3 months and then every 3 months.

## 2013-04-07 ENCOUNTER — Emergency Department: Payer: Self-pay | Admitting: Internal Medicine

## 2013-04-07 LAB — URINALYSIS, COMPLETE
Bilirubin,UR: NEGATIVE
Blood: NEGATIVE
Glucose,UR: NEGATIVE mg/dL (ref 0–75)
Ketone: NEGATIVE
Nitrite: NEGATIVE
Ph: 7 (ref 4.5–8.0)
Protein: NEGATIVE
Specific Gravity: 1.01 (ref 1.003–1.030)

## 2013-04-08 ENCOUNTER — Telehealth: Payer: Self-pay | Admitting: Neurology

## 2013-04-08 NOTE — Telephone Encounter (Signed)
I talked to the patient on call this weekend. She reported severe lower back pain and radiating pain down to the left leg. She was recently treated for urinary tract infection. She felt that her pain had become worse. She described more sciatica-type symptoms. She had not had any falls or injuries. I suggested that she get checked out in the emergency room because of the sudden onset of severe back pain. I explained to her that this could have several reasons one of which could be a herniated disc. She was in agreement and she indicated that she would be going to the emergency room.

## 2013-04-09 LAB — URINE CULTURE

## 2013-05-01 ENCOUNTER — Telehealth: Payer: Self-pay

## 2013-05-01 NOTE — Telephone Encounter (Signed)
Patient call back. Scheduled appt.

## 2013-05-01 NOTE — Telephone Encounter (Signed)
I called patient to scheduled her 6 month f/u. No answer. Vmail is full.

## 2013-05-16 NOTE — Telephone Encounter (Signed)
  Appt in  April recommended  was scheduled then cancld by patient.

## 2013-05-24 DIAGNOSIS — R339 Retention of urine, unspecified: Secondary | ICD-10-CM | POA: Insufficient documentation

## 2013-05-24 DIAGNOSIS — R350 Frequency of micturition: Secondary | ICD-10-CM | POA: Insufficient documentation

## 2013-05-24 DIAGNOSIS — N3941 Urge incontinence: Secondary | ICD-10-CM | POA: Insufficient documentation

## 2013-05-24 DIAGNOSIS — N3281 Overactive bladder: Secondary | ICD-10-CM | POA: Insufficient documentation

## 2013-05-31 ENCOUNTER — Other Ambulatory Visit: Payer: Self-pay

## 2013-05-31 MED ORDER — DIMETHYL FUMARATE 240 MG PO CPDR: 240.0000 mg | DELAYED_RELEASE_CAPSULE | Freq: Two times a day (BID) | ORAL | Status: DC

## 2013-06-21 ENCOUNTER — Ambulatory Visit: Payer: Self-pay | Admitting: Diagnostic Neuroimaging

## 2013-07-23 ENCOUNTER — Telehealth: Payer: Self-pay | Admitting: Diagnostic Neuroimaging

## 2013-07-23 NOTE — Telephone Encounter (Signed)
Patient would like a call back regarding side effect of Taxodera  as this weekend she noticed a large bruise on her breast. She states that the bruise does not hurt but questions whether she should see the doctor. Her mother does have a history of breast cancer and the patient is concerned.  Please call to advise.

## 2013-07-23 NOTE — Telephone Encounter (Signed)
Called pt and pt stated that she has a large bruise on her breast and she wanted to know if taking Tecfidera would cause this. I explained to the pt that bruising was not an side effect and that she needed to contact her PCP concerning this matter. Pt stated that she would contact them. I advised that pt that is she has any other problems, questions or concerns to call the office. Pt verbalized understanding.

## 2013-08-01 ENCOUNTER — Ambulatory Visit: Payer: Self-pay | Admitting: Obstetrics and Gynecology

## 2013-08-09 ENCOUNTER — Encounter: Payer: Self-pay | Admitting: Diagnostic Neuroimaging

## 2013-08-09 ENCOUNTER — Other Ambulatory Visit: Payer: Self-pay | Admitting: Diagnostic Neuroimaging

## 2013-08-09 ENCOUNTER — Ambulatory Visit (INDEPENDENT_AMBULATORY_CARE_PROVIDER_SITE_OTHER): Payer: BC Managed Care – PPO | Admitting: Diagnostic Neuroimaging

## 2013-08-09 VITALS — BP 95/66 | HR 85 | Temp 97.9°F | Ht 67.0 in | Wt 216.0 lb

## 2013-08-09 DIAGNOSIS — G35 Multiple sclerosis: Secondary | ICD-10-CM

## 2013-08-09 NOTE — Patient Instructions (Signed)
Continue tecfidera.

## 2013-08-09 NOTE — Progress Notes (Signed)
[facility]  PATIENT: Christy Cox DOB: February 20, 1976   REASON FOR VISIT: follow up HISTORY FROM: patient  HISTORY OF PRESENT ILLNESS:  UPDATE 08/09/13: Since last visit, doing well on tecfidera. No GI side effects. No new neurologic symptoms. Had follow up of bruising on breast with ob/gyn and mamography and u/s. Also s/p bladder botox, with great results.  UPDATE 12/21/12: Since last visit patient has had improvement in her lower extremity weakness and numbness sensation, following IV steroid treatment. MRI of the brain and thoracic spine were reviewed and are unremarkable compared to prior studies. JC virus antibody testing was negative. Patient is now contemplating switching to Gilenya or Tecfidera. She tolerates Rebif without difficulty, but does feel like she is getting tired of doing injections.  UPDATE 11/07/12 (LL): Patient calls for acute visit. She has been experiencing increased lower extremity weakness from the waist down since last Wednesday.  He arms have not been affected.  She denies recent illness, fevers, UTI symptoms, or cold symptoms. She denies falling but feels very unsteady.  UPDATE 06/01/12 (VRP): patient presented for annual followup visit for multiple sclerosis. I reviewed prior notes from my colleague Dr. Erling Cruz. Patient is doing well. She continues on rebif injections, but tells me that she is "tired of injections". We discussed oral MS medications. Patient's last neuroimaging studies were in 2004. She's had a fairly stable course since that time. In January 2014, patient was having some right eye painful symptoms without objective evidence of optic neuritis. Therefore she was not started on steroids. Symptoms last for one week and then resolved. Overall patient doing well. Continues to have some right leg weakness and mild gait difficulty. Her urinary control problems are her most severe symptoms at this time, and she is seeing a urologist for  management.   PRIOR HPI (03/09/12; Dr. Erling Cruz): 38 year old right-handed white married female from [place], [place] with multiple sclerosis. 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 She then had an MRI of the brain and of the cervical cord in 05/02/2002 showing multiple lesions in the brain some of which enhanced and similar lesions in the cervical spine, none of which enhanced. She has been on Rebif since October 2004 and has done very well. She had courses of high dose IV Solu-Medrol followed by oral prednisone for numbness in her hands, the last 09/2009. She had side effects from the Rebif with a sterile abscess in her right abdomen for which she underwent surgery and had good results. She has complained of her feet hurting and numbness in her feet in the past. She is currently on disability. She had an episode of optic neuritis 04/13/2004 evaluated by Dr. Philis Kendall in [place]. She was off Rebif for about 9 months due to financial issues and restarted in February, 2010. 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 She has stopped using a tanning bed. Her last MRIs of the cervical spine and brain were 05/02/2002. In May 2010 she developed chest pain and was evaluated with chest x-ray, 2-D echocardiogram, a 12-lead EKG which were normal. March 03, 2009 she fell in her bedroom and fractured her right tibia.  She had symptoms of a UTI with frequency and lower abdominal pain. She was initially placed on Cipro for 5 days, but had a resistant organism and then was placed on Macrodantin. She developed elevated liver function tests with jaundice and a bilirubin of 7.2. She was seen by Dr. Earlean Shawl who recognized her symptoms were  secondary to Macrodantin and her liver dysfunction cleared. Abdominal ultrasound 03/17/2009 showed multiple gallstones. She has nocturia every 1/2 hour and is followed by Dr. Jacqlyn Larsen. She has tried Imipramine and Oxybutynin. She got relief from VESIcare which her insurance will not cover.She denies injections problems with Rebif.She had plantar fasciitis on the right foot. One week ago she had cold like symptoms. 03/07/2012 she developed right eye pain when moving her eye. There was no loss of vision. She treated this with Excedrin. Today it is not as sore. Her neurologic review systems is otherwise negative.  REVIEW OF SYSTEMS: Full 14 system review of systems performed and notable only for as per HPI, otherwise negative.    ALLERGIES: Allergies  Allergen Reactions  . Macrodantin [Nitrofurantoin Macrocrystal]     Caused elevated liver function and jaundice    HOME MEDICATIONS: Prior to Admission medications   Medication Sig Start Date End Date Taking? Authorizing Provider  Dimethyl Fumarate (TECFIDERA) 240 MG CPDR Take 1 capsule (240 mg total) by mouth 2 (two) times daily. 05/31/13  Yes Penni Bombard, MD    PAST MEDICAL HISTORY: Past Medical History  Diagnosis Date  . Abscess, abdomen     H/o sterile abscess in right abd  . Bladder disorder, other     Dystonic bladder  . Multiple sclerosis     c/b transverse myelitis and optic neuritis    PAST SURGICAL HISTORY: Past Surgical History  Procedure Laterality Date  . Cesarean section  1999, 2003  . Tubes tied    . Hernia repair      FAMILY HISTORY: Family History  Problem Relation Age of Onset  . Cancer Mother 74    Breast  . Heart attack Father 58    x2 at 67 them at 41, now s/p CABG  . Cancer Other   . Coronary artery disease Other   . Diabetes Other     SOCIAL HISTORY: History   Social History  . Marital Status: Married    Spouse Name: dennis    Number of Children: 2  . Years of Education: College   Occupational  History  . n/a     disabled   Social History Main Topics  . Smoking status: Never Smoker   . Smokeless tobacco: Never Used  . Alcohol Use: No  . Drug Use: No  . Sexual Activity: Not on file   Other Topics Concern  . Not on file   Social History Narrative   Pt lives at home with her spouse.    Caffeine Use- 1-2 per week     PHYSICAL EXAM  Filed Vitals:   08/09/13 1127  BP: 95/66  Pulse: 85  Temp: 97.9 F (36.6 C)  TempSrc: Oral  Height: 5\' 7"  (1.702 m)  Weight: 216 lb (97.977 kg)   Body mass index is 33.82 kg/(m^2).  GENERAL EXAM: Patient is in no distress  CARDIOVASCULAR: Regular rate and rhythm, no murmurs, no carotid bruits  NEUROLOGIC: MENTAL STATUS: awake, alert, language fluent, comprehension intact, naming intact CRANIAL NERVE: no papilledema on fundoscopic exam, pupils equal and reactive to light, visual fields full to confrontation, extraocular muscles intact, no nystagmus,  facial sensation and strength symmetric, uvula midline, shoulder shrug symmetric, tongue midline. MOTOR: normal bulk and tone, full strength in the BUE, BLE SENSORY: normal and symmetric to light touch COORDINATION: finger-nose-finger, fine finger movements normal REFLEXES: deep tendon reflexes present, 2+ and symmetric GAIT/STATION: narrow based gait; able to walk on toes and tandem; romberg is negative   DIAGNOSTIC DATA (LABS, IMAGING, TESTING) - I reviewed patient records, labs, notes, testing and imaging myself where available.  08/03/12 MRI BRAIN w/wo Abnormal MRI brain (with and without) demonstrating:Multiple round and ovoid, periventricular, subcortical, juxtacortical and right middle cerebellar peduncle chronic demyelinating plaques. There is a right mesial temporal enhancing lesion, not seen on axial views, and may be acute plaque vs. artifact.   11/29/12 MRI BRAIN w/wo - multiple periventricular, periatrial, subcortical, corpus callosal, cerebellar and brainstem white matter  hyperintensity is quite typical for demyelinating disease - No enhancing lesions are noted. - no change from MRI on 08/03/12  11/29/12 MRI thoracic spine w/wo - Normal. No definite demyelinating lesions are noted.  11/29/12 JCV antibody (stratify) - 0.20 (NEGATIVE)   ASSESSMENT AND PLAN  38 y.o. female with multiple sclerosis, previously on rebif, now on tecfidera since 02/27/13. Stable.   PLAN: 1. MRI brain in Oct 2015 2. CBC today  Orders Placed This Encounter  Procedures  . MR Brain W Wo Contrast  . CBC With differential/Platelet   Return in about 6 months (around 02/08/2014).    Penni Bombard, MD 9/62/8366, 29:47 PM Certified in Neurology, Neurophysiology and [facility] [address], [place], [place] [address] [phone]

## 2013-08-29 LAB — CBC WITH DIFFERENTIAL
Basophils Absolute: 0 10*3/uL (ref 0.0–0.2)
Basos: 0 %
Eos: 3 %
Eosinophils Absolute: 0.1 10*3/uL (ref 0.0–0.4)
HCT: 37.6 % (ref 37.5–51.0)
Hemoglobin: 12.7 g/dL (ref 12.6–17.7)
Immature Grans (Abs): 0 10*3/uL (ref 0.0–0.1)
Immature Granulocytes: 0 %
Lymphocytes Absolute: 0.9 10*3/uL (ref 0.7–3.1)
Lymphs: 18 %
MCH: 29.3 pg (ref 26.6–33.0)
MCHC: 33.8 g/dL (ref 31.5–35.7)
MCV: 87 fL (ref 79–97)
Monocytes Absolute: 0.5 10*3/uL (ref 0.1–0.9)
Monocytes: 10 %
Neutrophils Absolute: 3.2 10*3/uL (ref 1.4–7.0)
Neutrophils Relative %: 69 %
Platelets: 131 10*3/uL — ABNORMAL LOW (ref 150–379)
RBC: 4.33 x10E6/uL (ref 4.14–5.80)
RDW: 15.6 % — ABNORMAL HIGH (ref 12.3–15.4)
WBC: 4.7 10*3/uL (ref 3.4–10.8)

## 2013-10-01 ENCOUNTER — Telehealth: Payer: Self-pay | Admitting: Diagnostic Neuroimaging

## 2013-10-01 MED ORDER — DIMETHYL FUMARATE 240 MG PO CPDR: 240.0000 mg | DELAYED_RELEASE_CAPSULE | Freq: Two times a day (BID) | ORAL | Status: DC

## 2013-10-01 NOTE — Telephone Encounter (Signed)
Patient calling to state that she only has 4 days left of her Tecfidera and she lost her health insurance, states that she needs a new script of Tecfidera to be faxed over to [facility] since [facility] do it for her. Patient is frantic and would like this done today.

## 2013-10-01 NOTE — Telephone Encounter (Signed)
Rx has been sent to [facility] per patient request.  I called the patient back.  She is aware.

## 2013-10-25 ENCOUNTER — Telehealth: Payer: Self-pay | Admitting: Diagnostic Neuroimaging

## 2013-10-25 NOTE — Telephone Encounter (Signed)
I called back.  Elberta Fortis did not pick up his line.  I called again.  Spoke with Elmo Putt.  She transferred me to [place], who then transferred me to [facility].  He said they already have a Rx on file, but the patient was approved to get shipment of 42 days rather than 30 days per fill through their program.  I advised it would be okay to ship this amount.  Nothing further is needed at this time.

## 2013-10-25 NOTE — Telephone Encounter (Signed)
Elberta Fortis with [facility] @ [phone] x [phone], need authorization to ship 42 day supply of Dimethyl Fumarate (TECFIDERA) [place]

## 2013-10-28 ENCOUNTER — Telehealth: Payer: Self-pay | Admitting: Neurology

## 2013-10-28 NOTE — Telephone Encounter (Signed)
Spoke to patient and she relayed that she has been out of medicine since Saturday and she spoke with Aspen? With [facility] who stated she needed a new prescription.  She relayed that she lost her insurance and is waiting for Medicaid to come through, and in the meantime is suppose to get free medication.  She has received one month free already but now needs more and says she needs 42 pills.  I relayed that it looked like this has already been taken care of according to a note on 10-25-13, but that I will forward this information also.

## 2013-10-28 NOTE — Telephone Encounter (Signed)
I did call on 08/28 and auth #42 caps (please see previous note).  I called again at [phone].  Spoke with [employer].  She reviewed the patient's file.  Said she sees that we called, however, Elberta Fortis did not update the info.  She then transferred me to [facility] (pharmacist).  Again gave verbal auth to dispense this med.  Says they will dispense #42 with 1 additional refill while patient transitions ins.  They will contact the patient regarding scheduling delivery.

## 2013-12-11 ENCOUNTER — Other Ambulatory Visit: Payer: BC Managed Care – PPO

## 2013-12-31 ENCOUNTER — Ambulatory Visit
Admission: RE | Admit: 2013-12-31 | Discharge: 2013-12-31 | Disposition: A | Discharge status: Home / Self Care | Payer: Medicaid Other | Source: Ambulatory Visit | Attending: Diagnostic Neuroimaging | Admitting: Diagnostic Neuroimaging

## 2013-12-31 ENCOUNTER — Encounter (INDEPENDENT_AMBULATORY_CARE_PROVIDER_SITE_OTHER): Payer: Medicare Other | Admitting: Diagnostic Neuroimaging

## 2013-12-31 DIAGNOSIS — G35 Multiple sclerosis: Secondary | ICD-10-CM

## 2013-12-31 MED ORDER — GADOBENATE DIMEGLUMINE 529 MG/ML IV SOLN
15.0000 mL | Freq: Once | INTRAVENOUS | Status: AC | PRN
Start: 2013-12-31 — End: 2013-12-31
  Administered 2013-12-31: 15 mL via INTRAVENOUS

## 2014-01-07 ENCOUNTER — Telehealth: Payer: Self-pay | Admitting: *Deleted

## 2014-01-07 NOTE — Telephone Encounter (Signed)
Patient is calling requesting results from her recent MRI.

## 2014-01-07 NOTE — Telephone Encounter (Signed)
I have called her MRI result  MRI brain (with and without) demonstrating: 1. Multiple round and ovoid, periventricular and subcortical, bilateral cerebellar chronic demyelinating plaques.  2. No abnormal lesions are seen on post contrast views. 3. Compared to MRI on 11/29/12, there is a new chronic left cerebellar plaque, that is not visualized on prior imaging. Otherwise no change.

## 2014-01-09 ENCOUNTER — Telehealth: Payer: Self-pay | Admitting: Diagnostic Neuroimaging

## 2014-01-09 NOTE — Telephone Encounter (Signed)
We have not gotten a PA request.  I called back, got no answer.  Left message.

## 2014-01-09 NOTE — Telephone Encounter (Signed)
Sharyn Lull with [facility] @ [phone] x [phone], checking status of Prior authorization request for Dimethyl Fumarate (TECFIDERA) 240 MG CPDR .  Please call and advise.

## 2014-01-10 NOTE — Telephone Encounter (Signed)
Sharyn Lull from [employer] returning call to Janett Billow, states she is faxing over the prior auth request again, please return call and advise.

## 2014-01-10 NOTE — Telephone Encounter (Signed)
Fax received, completed and forwarded to ins.  I called back.  Got no answer, left message.

## 2014-01-22 ENCOUNTER — Encounter: Payer: BC Managed Care – PPO | Admitting: Diagnostic Neuroimaging

## 2014-01-30 ENCOUNTER — Telehealth: Payer: Self-pay | Admitting: Diagnostic Neuroimaging

## 2014-01-30 NOTE — Telephone Encounter (Signed)
Unfortunately, this is a specialty medication and can only be filled at a specialty pharmacy.  I called back.  Spoke with the patient.  She said her Ins told her [facility]  [facility]  Spoke with Thurmond Butts.  He verified they are not able to order or dispense this medication, as it is a specialty drug.  I called the patient back.  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  She will call us back with this info.

## 2014-01-30 NOTE — Telephone Encounter (Signed)
Patient requesting Rx for Dimethyl Fumarate (TECFIDERA) 240 MG [facility]DR [facility]  Patient has new Insurance and hadn't been on medication due to termination of Insurance.  Please call and advise.

## 2014-02-10 ENCOUNTER — Telehealth: Payer: Self-pay | Admitting: Diagnostic Neuroimaging

## 2014-02-10 MED ORDER — DIMETHYL FUMARATE 240 MG PO CPDR: 240.0000 mg | DELAYED_RELEASE_CAPSULE | Freq: Two times a day (BID) | ORAL | Status: DC

## 2014-02-10 NOTE — Telephone Encounter (Signed)
Pharmacy info has been updated.  Rx has been sent, noting patient's ID #.

## 2014-02-10 NOTE — Telephone Encounter (Signed)
Patient is calling to get her Rx Tecfidera transferred to [place] because of an insurance change.Please include pt's ID# [id number]. Patient says this is Part D insurance.

## 2014-02-14 ENCOUNTER — Ambulatory Visit: Payer: BC Managed Care – PPO | Admitting: Diagnostic Neuroimaging

## 2014-02-19 ENCOUNTER — Ambulatory Visit (INDEPENDENT_AMBULATORY_CARE_PROVIDER_SITE_OTHER): Payer: Medicare Other | Admitting: Diagnostic Neuroimaging

## 2014-02-19 ENCOUNTER — Encounter: Payer: Self-pay | Admitting: Diagnostic Neuroimaging

## 2014-02-19 ENCOUNTER — Telehealth: Payer: Self-pay

## 2014-02-19 VITALS — BP 92/64 | HR 85 | Temp 97.3°F | Ht 67.0 in | Wt 231.8 lb

## 2014-02-19 DIAGNOSIS — N3000 Acute cystitis without hematuria: Secondary | ICD-10-CM | POA: Insufficient documentation

## 2014-02-19 DIAGNOSIS — G35 Multiple sclerosis: Secondary | ICD-10-CM

## 2014-02-19 MED ORDER — AMANTADINE HCL 100 MG PO CAPS: 100.0000 mg | ORAL_CAPSULE | Freq: Two times a day (BID) | ORAL | Status: DC

## 2014-02-19 NOTE — Patient Instructions (Signed)
We will restart your tecfidera.  Try amantadine for fatigue.

## 2014-02-19 NOTE — Progress Notes (Signed)
[facility]  PATIENT: Christy Cox DOB: 1976-02-29   REASON FOR VISIT: follow up HISTORY FROM: patient  Chief Complaint  Patient presents with  . Follow-up    MS     HISTORY OF PRESENT ILLNESS:  UPDATE 02/19/14: Since last visit, lost insurance and and ran out of tecfidera in August 2015. Trying to get back on tecfidera now. Feeling more fatigue and leg pain. Now focal neuro sxs. Still struggling with urinary frequency, leading to frequent waking, and then daytime sleepiness.  UPDATE 08/09/13: Since last visit, doing well on tecfidera. No GI side effects. No new neurologic symptoms. Had follow up of bruising on breast with ob/gyn and mamography and u/s. Also s/p bladder botox, with great results.  UPDATE 12/21/12: Since last visit patient has had improvement in her lower extremity weakness and numbness sensation, following IV steroid treatment. MRI of the brain and thoracic spine were reviewed and are unremarkable compared to prior studies. JC virus antibody testing was negative. Patient is now contemplating switching to Gilenya or Tecfidera. She tolerates Rebif without difficulty, but does feel like she is getting tired of doing injections.  UPDATE 38/10/14 (LL): Patient calls for acute visit. She has been experiencing increased lower extremity weakness from the waist down since last Wednesday.  He arms have not been affected.  She denies recent illness, fevers, UTI symptoms, or cold symptoms. She denies falling but feels very unsteady.  UPDATE 06/01/12 (VRP): patient presented for annual followup visit for multiple sclerosis. I reviewed prior notes from my colleague Dr. Erling Cruz. Patient is doing well. She continues on rebif injections, but tells me that she is "tired of injections". We discussed oral MS medications. Patient's last neuroimaging studies were in 2004. She's had a fairly stable course since that time. In January 2014, patient was having some right eye  painful symptoms without objective evidence of optic neuritis. Therefore she was not started on steroids. Symptoms last for one week and then resolved. Overall patient doing well. Continues to have some right leg weakness and mild gait difficulty. Her urinary control problems are her most severe symptoms at this time, and she is seeing a urologist for management.   PRIOR HPI (03/09/12; Dr. Erling Cruz): 38 year old right-handed white married female from [place], [place] with multiple sclerosis. 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 She then had an MRI of the brain and of the cervical cord in 05/02/2002 showing multiple lesions in the brain some of which enhanced and similar lesions in the cervical spine, none of which enhanced. She has been on Rebif since October 2004 and has done very well. She had courses of high dose IV Solu-Medrol followed by oral prednisone for numbness in her hands, the last 09/2009. She had side effects from the Rebif with a sterile abscess in her right abdomen for which she underwent surgery and had good results. She has complained of her feet hurting and numbness in her feet in the past. She is currently on disability. She had an episode of optic neuritis 04/13/2004 evaluated by Dr. Philis Kendall in [place]. She was off Rebif for about 9 months due to financial issues and restarted in February, 2010. 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 She has stopped using a tanning bed. Her last MRIs of the cervical spine and brain were 05/02/2002. In May 2010 she developed chest pain and was evaluated with chest x-ray, 2-D echocardiogram, a 12-lead EKG which were normal. March 03, 2009 she fell in her bedroom  and fractured her right tibia. She had symptoms of a UTI with frequency and lower abdominal pain. She was initially placed on Cipro for 5 days, but had a resistant organism and then was placed on Macrodantin. She developed elevated liver function tests with jaundice and a bilirubin of 7.2. She was seen by Dr. Earlean Shawl who recognized her symptoms were secondary to Macrodantin and her liver dysfunction cleared. Abdominal ultrasound 03/17/2009 showed multiple gallstones. She has nocturia every 1/2 hour and is followed by Dr. Jacqlyn Larsen. She has tried Imipramine and Oxybutynin. She got relief from VESIcare which her insurance will not cover.She denies injections problems with Rebif.She had plantar fasciitis on the right foot. One week ago she had cold like symptoms. 03/07/2012 she developed right eye pain when moving her eye. There was no loss of vision. She treated this with Excedrin. Today it is not as sore. Her neurologic review systems is otherwise negative.  REVIEW OF SYSTEMS: Full 14 system review of systems performed and notable only for as per HPI, otherwise negative.    ALLERGIES: Allergies  Allergen Reactions  . Macrodantin [Nitrofurantoin Macrocrystal]     Caused elevated liver function and jaundice    HOME MEDICATIONS: Prior to Admission medications   Medication Sig Start Date End Date Taking? Authorizing Provider  Dimethyl Fumarate (TECFIDERA) 240 MG CPDR Take 1 capsule (240 mg total) by mouth 2 (two) times daily. 05/31/13  Yes Penni Bombard, MD    PAST MEDICAL HISTORY: Past Medical History  Diagnosis Date  . Abscess, abdomen     H/o sterile abscess in right abd  . Bladder disorder, other     Dystonic bladder  . Multiple sclerosis     c/b transverse myelitis and optic neuritis    PAST SURGICAL HISTORY: Past Surgical History  Procedure Laterality Date  . Cesarean section  1999, 2003  . Tubes tied    . Hernia repair      FAMILY HISTORY: Family History  Problem Relation Age of  Onset  . Cancer Mother 23    Breast  . Heart attack Father 54    x2 at 8 them at 90, now s/p CABG  . Cancer Other   . Coronary artery disease Other   . Diabetes Other     SOCIAL HISTORY: History   Social History  . Marital Status: Married    Spouse Name: dennis    Number of Children: 2  . Years of Education: College   Occupational History  . n/a     disabled   Social History Main Topics  . Smoking status: Never Smoker   . Smokeless tobacco: Never Used  . Alcohol Use: No  . Drug Use: No  . Sexual Activity: Not on file   Other Topics Concern  . Not on file   Social History Narrative   Pt lives at home with her spouse.    Caffeine Use- 1-2 per week     PHYSICAL EXAM  Filed Vitals:   02/19/14 1301  BP: 92/64  Pulse: 85  Temp: 97.3 F (36.3 C)  TempSrc: Oral  Height: 5\' 7"  (1.702 m)  Weight: 231  lb 12.8 oz (105.144 kg)   Body mass index is 36.3 kg/(m^2).  GENERAL EXAM: Patient is in no distress  CARDIOVASCULAR: Regular rate and rhythm, no murmurs, no carotid bruits  NEUROLOGIC: MENTAL STATUS: awake, alert, language fluent, comprehension intact, naming intact CRANIAL NERVE: no papilledema on fundoscopic exam, pupils equal and reactive to light, visual fields full to confrontation, extraocular muscles intact, no nystagmus, facial sensation and strength symmetric, uvula midline, shoulder shrug symmetric, tongue midline. MOTOR: normal bulk and tone, full strength in the BUE, BLE SENSORY: normal and symmetric to light touch COORDINATION: finger-nose-finger, fine finger movements normal REFLEXES: deep tendon reflexes present, 2+ and symmetric GAIT/STATION: narrow based gait; able to walk on toes and tandem; romberg is negative   DIAGNOSTIC DATA (LABS, IMAGING, TESTING) - I reviewed patient records, labs, notes, testing and imaging myself where available.  11/29/12 MRI thoracic spine w/wo - Normal. No definite demyelinating lesions are noted.  11/29/12  JCV antibody (stratify) - 0.20 (NEGATIVE)  12/31/13 MRI brain (with and without) demonstrating: 1. Multiple round and ovoid, periventricular and subcortical, bilateral cerebellar chronic demyelinating plaques.  2. No abnormal lesions are seen on post contrast views. 3. Compared to MRI on 11/29/12, there is a new chronic left cerebellar plaque, that is not visualized on prior imaging. Otherwise no change.   ASSESSMENT AND PLAN  38 y.o. female with multiple sclerosis, previously on rebif, now on tecfidera since 02/27/13 - August 2015 (stopped due to losing insurance). Clinically with more fatigue and leg pain, and 1 new chronic plaque. Will resume tecfidera.   PLAN: 1. Restart tecfidera 2. Try amantadine for fatigue  Return in about 3 months (around 05/21/2014).    Penni Bombard, MD 92/44/6286, 3:81 PM Certified in Neurology, Neurophysiology and Neuroimaging  [facility] [address], [place], [place] [address] [phone]

## 2014-02-19 NOTE — Telephone Encounter (Signed)
I called [facility] at [phone].  Spoke with Tiffany.  She transferred me to [facility].  Verbally verified the patients Tecfidera Rx.  They will dispense Starter kit and have maintenance dose on file.  Says they will contact patient to schedule shipment of meds.

## 2014-03-24 ENCOUNTER — Telehealth: Payer: Self-pay

## 2014-03-24 NOTE — Telephone Encounter (Signed)
Noted.  I called the patient.  Got no answer.  Left message asking that she call us back with new ins info.

## 2014-03-24 NOTE — Telephone Encounter (Signed)
[employer] called stating that patient does not have insurance at this time.

## 2014-04-02 ENCOUNTER — Telehealth: Payer: Self-pay | Admitting: Diagnostic Neuroimaging

## 2014-04-02 MED ORDER — DIMETHYL FUMARATE 240 MG PO CPDR: 240.0000 mg | DELAYED_RELEASE_CAPSULE | Freq: Two times a day (BID) | ORAL | Status: DC

## 2014-04-02 NOTE — Telephone Encounter (Signed)
Pt is calling stating she needs for her Rx for Tecfedria needs to called in to [facility] [phone].  Medicare part D has changed where she gets her meds.  She states she only has 3 days of medication left.  Please call and advise.

## 2014-04-02 NOTE — Telephone Encounter (Signed)
The number left was for [employer].  I have added [facility] to the chart, and Rx has been sent.  I called the patient back.  She said her pharmacy has changed four times in the last 4 months.  She verified for this month, she will be using [facility] and will call us back if this changes again.

## 2014-04-03 DIAGNOSIS — R8281 Pyuria: Secondary | ICD-10-CM | POA: Insufficient documentation

## 2014-04-08 ENCOUNTER — Telehealth: Payer: Self-pay | Admitting: Diagnostic Neuroimaging

## 2014-04-08 NOTE — Telephone Encounter (Signed)
I contacted Ins, however, they are requesting the patient ID # .  I called the patient back to obtain this info.  She said it is [id number].  I have contacted ins and provided all clinical info.  Patient is aware.

## 2014-04-08 NOTE — Telephone Encounter (Signed)
Patient is calling because [facility] is telling her they need auth for Rx Tecsidera 240 mg 2 times a day.  Josem Kaufmann can be called in to [phone].  Please call.

## 2014-05-21 ENCOUNTER — Ambulatory Visit: Payer: Medicare Other | Admitting: Diagnostic Neuroimaging

## 2014-06-09 ENCOUNTER — Ambulatory Visit: Payer: Medicare Other | Admitting: Diagnostic Neuroimaging

## 2014-06-10 ENCOUNTER — Encounter: Payer: Self-pay | Admitting: Podiatry

## 2014-06-10 ENCOUNTER — Ambulatory Visit (INDEPENDENT_AMBULATORY_CARE_PROVIDER_SITE_OTHER): Payer: Medicare Other

## 2014-06-10 ENCOUNTER — Ambulatory Visit (INDEPENDENT_AMBULATORY_CARE_PROVIDER_SITE_OTHER): Payer: Medicare Other | Admitting: Podiatry

## 2014-06-10 VITALS — BP 109/68 | HR 79 | Resp 16 | Ht 67.0 in | Wt 220.0 lb

## 2014-06-10 DIAGNOSIS — M722 Plantar fascial fibromatosis: Secondary | ICD-10-CM

## 2014-06-10 NOTE — Progress Notes (Signed)
   Subjective:    Patient ID: Christy Cox, female    DOB: 08/08/1975, 39 y.o.   MRN: [id number]  HPI 40 year old female persists he also complains of right heel pain which is been ongoing for a couple months. She denies any history of injury or trauma to the area and denies any change increase activity the symptoms. She states that she does have multiple sclerosis and that she chronically has achiness to her legs. She denies any numbness or tingling. The pain does not wake her up at night. She states that she was to offer a day as she does not for possible wearing shoes due to multiple sclerosis. No other complaints at this time.   Review of Systems  Genitourinary: Positive for frequency and difficulty urinating.  All other systems reviewed and are negative.      Objective:   Physical Exam AAO x3, NAD DP/PT pulses palpable bilaterally, CRT less than 3 seconds Protective sensation intact with Simms Weinstein monofilament, vibratory sensation intact, Achilles tendon reflex intact Tenderness to palpation overlying the plantar medial tubercle of the calcaneus to right heel at the insertion of the plantar fascia. There is no pain along the course of plantar fascial within the arch of the foot. There is no pain with lateral compression of the calcaneus or pain the vibratory sensation. No pain on the posterior aspect of the calcaneus or along the course/insertion of the Achilles tendon. There is no overlying edema, erythema, increase in warmth. No other areas of tenderness palpation or pain with vibratory sensation to the foot/ankle. MMT 5/5, ROM WNL No open lesions or pre-ulcerative lesions are identified. No pain with calf compression, swelling, warmth, erythema.      Assessment & Plan:  39 year old female with right heel pain, likely plantar fasciitis -x-rays were obtained and reviewed the patient. -Treatment options were discussed the patient alternatives, risks, complications -discussed  their injection however she wishes to hold off at this time. -Anti-inflammatories as needed -Ice to the area -Discussed stretching exercises -Dispensed plantar fascial brace -Discussed shoe gear modifications and orthotics. If she continues to wear sandals recommend a sandal with a good arch support. -Followup in 3 weeks or sooner if any problems are to arise. In the meantime occur to call the office they questions or concerns.

## 2014-06-10 NOTE — Progress Notes (Signed)
   Subjective:    Patient ID: Christy Cox, female    DOB: [birth year], 39 y.o.   MRN: [id number]  HPI    Review of Systems  All other systems reviewed and are negative.      Objective:   Physical Exam        Assessment & Plan:

## 2014-06-10 NOTE — Patient Instructions (Signed)
Plantar Fasciitis (Heel Spur Syndrome) with Rehab The plantar fascia is a fibrous, ligament-like, soft-tissue structure that spans the bottom of the foot. Plantar fasciitis is a condition that causes pain in the foot due to inflammation of the tissue. SYMPTOMS   Pain and tenderness on the underneath side of the foot.  Pain that worsens with standing or walking. CAUSES  Plantar fasciitis is caused by irritation and injury to the plantar fascia on the underneath side of the foot. Common mechanisms of injury include:  Direct trauma to bottom of the foot.  Damage to a small nerve that runs under the foot where the main fascia attaches to the heel bone.  Stress placed on the plantar fascia due to bone spurs. RISK INCREASES WITH:   Activities that place stress on the plantar fascia (running, jumping, pivoting, or cutting).  Poor strength and flexibility.  Improperly fitted shoes.  Tight calf muscles.  Flat feet.  Failure to warm-up properly before activity.  Obesity. PREVENTION  Warm up and stretch properly before activity.  Allow for adequate recovery between workouts.  Maintain physical fitness:  Strength, flexibility, and endurance.  Cardiovascular fitness.  Maintain a health body weight.  Avoid stress on the plantar fascia.  Wear properly fitted shoes, including arch supports for individuals who have flat feet. PROGNOSIS  If treated properly, then the symptoms of plantar fasciitis usually resolve without surgery. However, occasionally surgery is necessary. RELATED COMPLICATIONS   Recurrent symptoms that may result in a chronic condition.  Problems of the lower back that are caused by compensating for the injury, such as limping.  Pain or weakness of the foot during push-off following surgery.  Chronic inflammation, scarring, and partial or complete fascia tear, occurring more often from repeated injections. TREATMENT  Treatment initially involves the use of  ice and medication to help reduce pain and inflammation. The use of strengthening and stretching exercises may help reduce pain with activity, especially stretches of the Achilles tendon. These exercises may be performed at home or with a therapist. Your caregiver may recommend that you use heel cups of arch supports to help reduce stress on the plantar fascia. Occasionally, corticosteroid injections are given to reduce inflammation. If symptoms persist for greater than 6 months despite non-surgical (conservative), then surgery may be recommended.  MEDICATION   If pain medication is necessary, then nonsteroidal anti-inflammatory medications, such as aspirin and ibuprofen, or other minor pain relievers, such as acetaminophen, are often recommended.  Do not take pain medication within 7 days before surgery.  Prescription pain relievers may be given if deemed necessary by your caregiver. Use only as directed and only as much as you need.  Corticosteroid injections may be given by your caregiver. These injections should be reserved for the most serious cases, because they may only be given a certain number of times. HEAT AND COLD  Cold treatment (icing) relieves pain and reduces inflammation. Cold treatment should be applied for 10 to 15 minutes every 2 to 3 hours for inflammation and pain and immediately after any activity that aggravates your symptoms. Use ice packs or massage the area with a piece of ice (ice massage).  Heat treatment may be used prior to performing the stretching and strengthening activities prescribed by your caregiver, physical therapist, or athletic trainer. Use a heat pack or soak the injury in warm water. SEEK IMMEDIATE MEDICAL CARE IF:  Treatment seems to offer no benefit, or the condition worsens.  Any medications produce adverse side effects. EXERCISES RANGE   OF MOTION (ROM) AND STRETCHING EXERCISES - Plantar Fasciitis (Heel Spur Syndrome) These exercises may help you  when beginning to rehabilitate your injury. Your symptoms may resolve with or without further involvement from your physician, physical therapist or athletic trainer. While completing these exercises, remember:   Restoring tissue flexibility helps normal motion to return to the joints. This allows healthier, less painful movement and activity.  An effective stretch should be held for at least 30 seconds.  A stretch should never be painful. You should only feel a gentle lengthening or release in the stretched tissue. RANGE OF MOTION - Toe Extension, Flexion  Sit with your right / left leg crossed over your opposite knee.  Grasp your toes and gently pull them back toward the top of your foot. You should feel a stretch on the bottom of your toes and/or foot.  Hold this stretch for __________ seconds.  Now, gently pull your toes toward the bottom of your foot. You should feel a stretch on the top of your toes and or foot.  Hold this stretch for __________ seconds. Repeat __________ times. Complete this stretch __________ times per day.  RANGE OF MOTION - Ankle Dorsiflexion, Active Assisted  Remove shoes and sit on a chair that is preferably not on a carpeted surface.  Place right / left foot under knee. Extend your opposite leg for support.  Keeping your heel down, slide your right / left foot back toward the chair until you feel a stretch at your ankle or calf. If you do not feel a stretch, slide your bottom forward to the edge of the chair, while still keeping your heel down.  Hold this stretch for __________ seconds. Repeat __________ times. Complete this stretch __________ times per day.  STRETCH - Gastroc, Standing  Place hands on wall.  Extend right / left leg, keeping the front knee somewhat bent.  Slightly point your toes inward on your back foot.  Keeping your right / left heel on the floor and your knee straight, shift your weight toward the wall, not allowing your back to  arch.  You should feel a gentle stretch in the right / left calf. Hold this position for __________ seconds. Repeat __________ times. Complete this stretch __________ times per day. STRETCH - Soleus, Standing  Place hands on wall.  Extend right / left leg, keeping the other knee somewhat bent.  Slightly point your toes inward on your back foot.  Keep your right / left heel on the floor, bend your back knee, and slightly shift your weight over the back leg so that you feel a gentle stretch deep in your back calf.  Hold this position for __________ seconds. Repeat __________ times. Complete this stretch __________ times per day. STRETCH - Gastrocsoleus, Standing  Note: This exercise can place a lot of stress on your foot and ankle. Please complete this exercise only if specifically instructed by your caregiver.   Place the ball of your right / left foot on a step, keeping your other foot firmly on the same step.  Hold on to the wall or a rail for balance.  Slowly lift your other foot, allowing your body weight to press your heel down over the edge of the step.  You should feel a stretch in your right / left calf.  Hold this position for __________ seconds.  Repeat this exercise with a slight bend in your right / left knee. Repeat __________ times. Complete this stretch __________ times per day.    STRENGTHENING EXERCISES - Plantar Fasciitis (Heel Spur Syndrome)  These exercises may help you when beginning to rehabilitate your injury. They may resolve your symptoms with or without further involvement from your physician, physical therapist or athletic trainer. While completing these exercises, remember:   Muscles can gain both the endurance and the strength needed for everyday activities through controlled exercises.  Complete these exercises as instructed by your physician, physical therapist or athletic trainer. Progress the resistance and repetitions only as guided. STRENGTH -  Towel Curls  Sit in a chair positioned on a non-carpeted surface.  Place your foot on a towel, keeping your heel on the floor.  Pull the towel toward your heel by only curling your toes. Keep your heel on the floor.  If instructed by your physician, physical therapist or athletic trainer, add ____________________ at the end of the towel. Repeat __________ times. Complete this exercise __________ times per day. STRENGTH - Ankle Inversion  Secure one end of a rubber exercise band/tubing to a fixed object (table, pole). Loop the other end around your foot just before your toes.  Place your fists between your knees. This will focus your strengthening at your ankle.  Slowly, pull your big toe up and in, making sure the band/tubing is positioned to resist the entire motion.  Hold this position for __________ seconds.  Have your muscles resist the band/tubing as it slowly pulls your foot back to the starting position. Repeat __________ times. Complete this exercises __________ times per day.  Document Released: 02/14/2005 Document Revised: 05/09/2011 Document Reviewed: 05/29/2008 [employer] Patient Information 2015 [employer]. This information is not intended to replace advice given to you by your health care provider. Make sure you discuss any questions you have with your health care provider.  

## 2014-06-13 ENCOUNTER — Encounter: Payer: Self-pay | Admitting: Diagnostic Neuroimaging

## 2014-06-13 ENCOUNTER — Ambulatory Visit (INDEPENDENT_AMBULATORY_CARE_PROVIDER_SITE_OTHER): Payer: Medicare Other | Admitting: Diagnostic Neuroimaging

## 2014-06-13 VITALS — BP 98/62 | HR 61 | Ht 67.0 in | Wt 239.7 lb

## 2014-06-13 DIAGNOSIS — G35 Multiple sclerosis: Secondary | ICD-10-CM | POA: Diagnosis not present

## 2014-06-13 NOTE — Patient Instructions (Signed)
Continue current medications. 

## 2014-06-13 NOTE — Progress Notes (Signed)
[facility]  PATIENT: Christy Cox DOB: 03-May-1975   REASON FOR VISIT: follow up HISTORY FROM: patient  Chief Complaint  Patient presents with  . Follow-up    Multiple Sclerosis      HISTORY OF PRESENT ILLNESS:  UPDATE 06/13/14: Since last visit, doing well. Tried amantadine, but didn't help fatigue after 2 weeks, so she stopped it. Otherwise stable with MS sxs. Bladder still challenging (urgency, freq, recurrent UTI, night time awakening).  UPDATE 02/19/14: Since last visit, lost insurance and and ran out of tecfidera in August 2015. Trying to get back on tecfidera now. Feeling more fatigue and leg pain. Now focal neuro sxs. Still struggling with urinary frequency, leading to frequent waking, and then daytime sleepiness.  UPDATE 08/09/13: Since last visit, doing well on tecfidera. No GI side effects. No new neurologic symptoms. Had follow up of bruising on breast with ob/gyn and mamography and u/s. Also s/p bladder botox, with great results.  UPDATE 12/21/12: Since last visit patient has had improvement in her lower extremity weakness and numbness sensation, following IV steroid treatment. MRI of the brain and thoracic spine were reviewed and are unremarkable compared to prior studies. JC virus antibody testing was negative. Patient is now contemplating switching to Gilenya or Tecfidera. She tolerates Rebif without difficulty, but does feel like she is getting tired of doing injections.  UPDATE 11/07/12 (LL): Patient calls for acute visit. She has been experiencing increased lower extremity weakness from the waist down since last Wednesday.  He arms have not been affected.  She denies recent illness, fevers, UTI symptoms, or cold symptoms. She denies falling but feels very unsteady.  UPDATE 06/01/12 ([facility]): patient presented for annual followup visit for multiple sclerosis. I reviewed prior notes from my colleague Dr. Erling Cruz. Patient is doing well. She continues on  rebif injections, but tells me that she is "tired of injections". We discussed oral MS medications. Patient's last neuroimaging studies were in 2004. She's had a fairly stable course since that time. In January 2014, patient was having some right eye painful symptoms without objective evidence of optic neuritis. Therefore she was not started on steroids. Symptoms last for one week and then resolved. Overall patient doing well. Continues to have some right leg weakness and mild gait difficulty. Her urinary control problems are her most severe symptoms at this time, and she is seeing a urologist for management.   PRIOR HPI (03/09/12; Dr. Erling Cruz): 39 year old right-handed white married female from [place], [place] with multiple sclerosis. 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 She then had an MRI of the brain and of the cervical cord in 05/02/2002 showing multiple lesions in the brain some of which enhanced and similar lesions in the cervical spine, none of which enhanced. She has been on Rebif since October 2004 and has done very well. She had courses of high dose IV Solu-Medrol followed by oral prednisone for numbness in her hands, the last 09/2009. She had side effects from the Rebif with a sterile abscess in her right abdomen for which she underwent surgery and had good results. She has complained of her feet hurting and numbness in her feet in the past. She is currently on disability. She had an episode of optic neuritis 04/13/2004 evaluated by Dr. Philis Kendall in  [place]. She was off Rebif for about 9 months due to financial issues and restarted in February, 2010. 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 She has  stopped using a tanning bed. Her last MRIs of the cervical spine and brain were 05/02/2002. In May 2010 she developed chest pain and was evaluated with chest x-ray, 2-D echocardiogram, a 12-lead EKG which were normal. March 03, 2009 she fell in her bedroom and fractured her right tibia. She had symptoms of a UTI with frequency and lower abdominal pain. She was initially placed on Cipro for 5 days, but had a resistant organism and then was placed on Macrodantin. She developed elevated liver function tests with jaundice and a bilirubin of 7.2. She was seen by Dr. Earlean Shawl who recognized her symptoms were secondary to Macrodantin and her liver dysfunction cleared. Abdominal ultrasound 03/17/2009 showed multiple gallstones. She has nocturia every 1/2 hour and is followed by Dr. Jacqlyn Larsen. She has tried Imipramine and Oxybutynin. She got relief from VESIcare which her insurance will not cover.She denies injections problems with Rebif.She had plantar fasciitis on the right foot. One week ago she had cold like symptoms. 03/07/2012 she developed right eye pain when moving her eye. There was no loss of vision. She treated this with Excedrin. Today it is not as sore. Her neurologic review systems is otherwise negative.  REVIEW OF SYSTEMS: Full 14 system review of systems performed and notable only for as per HPI, otherwise negative.    ALLERGIES: Allergies  Allergen Reactions  . Nitrofurantoin Other (See Comments)    Liver problems  . Macrodantin [Nitrofurantoin Macrocrystal]     Caused elevated liver function and jaundice    HOME MEDICATIONS:  Outpatient Prescriptions Prior to Visit  Medication Sig Dispense Refill  . Dimethyl Fumarate 240 MG CPDR Take 1 capsule (240 mg total) by mouth 2 (two) times daily. 180 capsule 3  . oxybutynin (DITROPAN) 5 MG tablet Take 5 mg by mouth 3 (three) times daily.    Marland Kitchen amantadine (SYMMETREL) 100 MG capsule Take 1 capsule (100 mg total) by mouth 2 (two) times daily. 60 capsule 12  .  Dimethyl Fumarate 240 MG CPDR Take 240 mg by mouth.     No facility-administered medications prior to visit.    PAST MEDICAL HISTORY: Past Medical History  Diagnosis Date  . Abscess, abdomen     H/o sterile abscess in right abd  . Bladder disorder, other     Dystonic bladder  . Multiple sclerosis     c/b transverse myelitis and optic neuritis    PAST SURGICAL HISTORY: Past Surgical History  Procedure Laterality Date  . Cesarean section  1999, 2003  . Tubes tied    . Hernia repair      FAMILY HISTORY: Family History  Problem Relation Age of Onset  . Cancer Mother 83    Breast  . Heart attack Father 39    x2 at 31 them at 20, now s/p CABG  . Cancer Other   . Coronary artery disease Other   . Diabetes Other     SOCIAL HISTORY: History   Social History  . Marital Status: Married    Spouse Name: dennis  . Number of Children: 2  . Years of Education: College   Occupational History  . n/a     disabled   Social  History Main Topics  . Smoking status: Never Smoker   . Smokeless tobacco: Never Used  . Alcohol Use: No  . Drug Use: No  . Sexual Activity: Not on file   Other Topics Concern  . Not on file   Social History Narrative   Pt lives at home with her spouse.    Caffeine Use- 1-2 per week     PHYSICAL EXAM  Filed Vitals:   06/13/14 1000 06/13/14 1001  BP:  98/62  Pulse:  61  Height: 5\' 7"  (1.702 m)   Weight: 239 lb 11.2 oz (108.727 kg)    Body mass index is 37.53 kg/(m^2).  GENERAL EXAM: Patient is in no distress  CARDIOVASCULAR: Regular rate and rhythm, no murmurs, no carotid bruits  NEUROLOGIC: MENTAL STATUS: awake, alert, language fluent, comprehension intact, naming intact CRANIAL NERVE: no papilledema on fundoscopic exam, pupils equal and reactive to light, visual fields full to confrontation, extraocular muscles intact, no nystagmus, facial sensation and strength symmetric, uvula midline, shoulder shrug symmetric, tongue  midline. MOTOR: normal bulk and tone, full strength in the BUE, BLE SENSORY: normal and symmetric to light touch, temp and vibration COORDINATION: finger-nose-finger, fine finger movements normal REFLEXES: deep tendon reflexes present, 2+ and symmetric GAIT/STATION: narrow based gait; able to walk tandem; romberg is negative   DIAGNOSTIC DATA (LABS, IMAGING, TESTING)  Lab Results  Component Value Date   WBC 4.7 08/09/2013   HGB 12.7 08/09/2013   HCT 37.6 08/09/2013   MCV 87 08/09/2013   PLT 131* 08/09/2013   CMP     Component Value Date/Time   NA 138 11/29/2012 1045   K 4.0 11/29/2012 1045   CL 103 11/29/2012 1045   CO2 27 11/29/2012 1045   GLUCOSE 79 11/29/2012 1045   BUN 10 11/29/2012 1045   CREATININE 0.61 11/29/2012 1045   CALCIUM 8.6* 11/29/2012 1045   PROT 6.1 11/29/2012 1045   AST 37 11/29/2012 1045   ALT 40* 11/29/2012 1045   ALKPHOS 56 11/29/2012 1045   BILITOT 0.5 11/29/2012 1045   GFRNONAA 116 11/29/2012 1045   GFRAA 134 11/29/2012 1045   LYMPHOCYTES ABSOLUTE  Date Value Ref Range Status  08/09/2013 0.9 0.7 - 3.1 x10E3/uL Final  11/29/2012 0.9 0.7 - 3.1 x10E3/uL Final    11/29/12 MRI thoracic spine w/wo - Normal. No definite demyelinating lesions are noted.  11/29/12 JCV antibody (stratify) - 0.20 (NEGATIVE)  12/31/13 MRI brain (with and without) demonstrating: 1. Multiple round and ovoid, periventricular and subcortical, bilateral cerebellar chronic demyelinating plaques.  2. No abnormal lesions are seen on post contrast views. 3. Compared to MRI on 11/29/12, there is a new chronic left cerebellar plaque, that is not visualized on prior imaging. Otherwise no change.   ASSESSMENT AND PLAN  39 y.o. female with multiple sclerosis, previously on rebif, now on tecfidera since 02/27/13 - August 2015 (stopped due to losing insurance). Clinically with more fatigue and leg pain, and 1 new chronic plaque. Then restarted tecfidera in Dec 2015. Now  stable.   PLAN: 1. Continue tecfidera  Orders Placed This Encounter  Procedures  . CBC with Differential/Platelet  . Comprehensive metabolic panel   Return in about 6 months (around 12/13/2014).   I spent 15 minutes of face to face time with patient. Greater than 50% of time was spent in counseling and coordination of care with patient.     Penni Bombard, MD 0/27/2536, 64:40 AM Certified in Neurology, Neurophysiology and Neuroimaging  [facility]  [address], [place], [place] [address] [phone]

## 2014-06-14 LAB — COMPREHENSIVE METABOLIC PANEL
ALT: 11 IU/L (ref 0–32)
AST: 13 IU/L (ref 0–40)
Albumin/Globulin Ratio: 1.7 (ref 1.1–2.5)
Albumin: 4.7 g/dL (ref 3.5–5.5)
Alkaline Phosphatase: 59 IU/L (ref 39–117)
BUN/Creatinine Ratio: 19 (ref 8–20)
BUN: 13 mg/dL (ref 6–20)
Bilirubin Total: 1 mg/dL (ref 0.0–1.2)
CO2: 21 mmol/L (ref 18–29)
Calcium: 9.5 mg/dL (ref 8.7–10.2)
Chloride: 98 mmol/L (ref 97–108)
Creatinine, Ser: 0.7 mg/dL (ref 0.57–1.00)
GFR calc Af Amer: 126 mL/min/{1.73_m2} (ref >59)
GFR calc non Af Amer: 109 mL/min/{1.73_m2} (ref >59)
Globulin, Total: 2.7 g/dL (ref 1.5–4.5)
Glucose: 72 mg/dL (ref 65–99)
Potassium: 4.5 mmol/L (ref 3.5–5.2)
Sodium: 139 mmol/L (ref 134–144)
Total Protein: 7.4 g/dL (ref 6.0–8.5)

## 2014-06-14 LAB — CBC WITH DIFFERENTIAL/PLATELET
Basophils Absolute: 0 10*3/uL (ref 0.0–0.2)
Basos: 1 %
Eos: 1 %
Eosinophils Absolute: 0.1 10*3/uL (ref 0.0–0.4)
HCT: 39.3 % (ref 34.0–46.6)
Hemoglobin: 12.9 g/dL (ref 11.1–15.9)
Immature Grans (Abs): 0 10*3/uL (ref 0.0–0.1)
Immature Granulocytes: 0 %
Lymphocytes Absolute: 1.2 10*3/uL (ref 0.7–3.1)
Lymphs: 20 %
MCH: 29.9 pg (ref 26.6–33.0)
MCHC: 32.8 g/dL (ref 31.5–35.7)
MCV: 91 fL (ref 79–97)
Monocytes Absolute: 0.4 10*3/uL (ref 0.1–0.9)
Monocytes: 7 %
Neutrophils Absolute: 4.1 10*3/uL (ref 1.4–7.0)
Neutrophils Relative %: 71 %
Platelets: 157 10*3/uL (ref 150–379)
RBC: 4.31 x10E6/uL (ref 3.77–5.28)
RDW: 15.2 % (ref 12.3–15.4)
WBC: 5.8 10*3/uL (ref 3.4–10.8)

## 2014-07-01 ENCOUNTER — Encounter: Payer: Self-pay | Admitting: Podiatry

## 2014-07-01 ENCOUNTER — Ambulatory Visit (INDEPENDENT_AMBULATORY_CARE_PROVIDER_SITE_OTHER): Payer: Medicare Other | Admitting: Podiatry

## 2014-07-01 VITALS — Ht 67.0 in

## 2014-07-01 DIAGNOSIS — M722 Plantar fascial fibromatosis: Secondary | ICD-10-CM | POA: Diagnosis not present

## 2014-07-01 NOTE — Patient Instructions (Signed)
Plantar Fasciitis (Heel Spur Syndrome) with Rehab The plantar fascia is a fibrous, ligament-like, soft-tissue structure that spans the bottom of the foot. Plantar fasciitis is a condition that causes pain in the foot due to inflammation of the tissue. SYMPTOMS   Pain and tenderness on the underneath side of the foot.  Pain that worsens with standing or walking. CAUSES  Plantar fasciitis is caused by irritation and injury to the plantar fascia on the underneath side of the foot. Common mechanisms of injury include:  Direct trauma to bottom of the foot.  Damage to a small nerve that runs under the foot where the main fascia attaches to the heel bone.  Stress placed on the plantar fascia due to bone spurs. RISK INCREASES WITH:   Activities that place stress on the plantar fascia (running, jumping, pivoting, or cutting).  Poor strength and flexibility.  Improperly fitted shoes.  Tight calf muscles.  Flat feet.  Failure to warm-up properly before activity.  Obesity. PREVENTION  Warm up and stretch properly before activity.  Allow for adequate recovery between workouts.  Maintain physical fitness:  Strength, flexibility, and endurance.  Cardiovascular fitness.  Maintain a health body weight.  Avoid stress on the plantar fascia.  Wear properly fitted shoes, including arch supports for individuals who have flat feet. PROGNOSIS  If treated properly, then the symptoms of plantar fasciitis usually resolve without surgery. However, occasionally surgery is necessary. RELATED COMPLICATIONS   Recurrent symptoms that may result in a chronic condition.  Problems of the lower back that are caused by compensating for the injury, such as limping.  Pain or weakness of the foot during push-off following surgery.  Chronic inflammation, scarring, and partial or complete fascia tear, occurring more often from repeated injections. TREATMENT  Treatment initially involves the use of  ice and medication to help reduce pain and inflammation. The use of strengthening and stretching exercises may help reduce pain with activity, especially stretches of the Achilles tendon. These exercises may be performed at home or with a therapist. Your caregiver may recommend that you use heel cups of arch supports to help reduce stress on the plantar fascia. Occasionally, corticosteroid injections are given to reduce inflammation. If symptoms persist for greater than 6 months despite non-surgical (conservative), then surgery may be recommended.  MEDICATION   If pain medication is necessary, then nonsteroidal anti-inflammatory medications, such as aspirin and ibuprofen, or other minor pain relievers, such as acetaminophen, are often recommended.  Do not take pain medication within 7 days before surgery.  Prescription pain relievers may be given if deemed necessary by your caregiver. Use only as directed and only as much as you need.  Corticosteroid injections may be given by your caregiver. These injections should be reserved for the most serious cases, because they may only be given a certain number of times. HEAT AND COLD  Cold treatment (icing) relieves pain and reduces inflammation. Cold treatment should be applied for 10 to 15 minutes every 2 to 3 hours for inflammation and pain and immediately after any activity that aggravates your symptoms. Use ice packs or massage the area with a piece of ice (ice massage).  Heat treatment may be used prior to performing the stretching and strengthening activities prescribed by your caregiver, physical therapist, or athletic trainer. Use a heat pack or soak the injury in warm water. SEEK IMMEDIATE MEDICAL CARE IF:  Treatment seems to offer no benefit, or the condition worsens.  Any medications produce adverse side effects. EXERCISES RANGE   OF MOTION (ROM) AND STRETCHING EXERCISES - Plantar Fasciitis (Heel Spur Syndrome) These exercises may help you  when beginning to rehabilitate your injury. Your symptoms may resolve with or without further involvement from your physician, physical therapist or athletic trainer. While completing these exercises, remember:   Restoring tissue flexibility helps normal motion to return to the joints. This allows healthier, less painful movement and activity.  An effective stretch should be held for at least 30 seconds.  A stretch should never be painful. You should only feel a gentle lengthening or release in the stretched tissue. RANGE OF MOTION - Toe Extension, Flexion  Sit with your right / left leg crossed over your opposite knee.  Grasp your toes and gently pull them back toward the top of your foot. You should feel a stretch on the bottom of your toes and/or foot.  Hold this stretch for __________ seconds.  Now, gently pull your toes toward the bottom of your foot. You should feel a stretch on the top of your toes and or foot.  Hold this stretch for __________ seconds. Repeat __________ times. Complete this stretch __________ times per day.  RANGE OF MOTION - Ankle Dorsiflexion, Active Assisted  Remove shoes and sit on a chair that is preferably not on a carpeted surface.  Place right / left foot under knee. Extend your opposite leg for support.  Keeping your heel down, slide your right / left foot back toward the chair until you feel a stretch at your ankle or calf. If you do not feel a stretch, slide your bottom forward to the edge of the chair, while still keeping your heel down.  Hold this stretch for __________ seconds. Repeat __________ times. Complete this stretch __________ times per day.  STRETCH - Gastroc, Standing  Place hands on wall.  Extend right / left leg, keeping the front knee somewhat bent.  Slightly point your toes inward on your back foot.  Keeping your right / left heel on the floor and your knee straight, shift your weight toward the wall, not allowing your back to  arch.  You should feel a gentle stretch in the right / left calf. Hold this position for __________ seconds. Repeat __________ times. Complete this stretch __________ times per day. STRETCH - Soleus, Standing  Place hands on wall.  Extend right / left leg, keeping the other knee somewhat bent.  Slightly point your toes inward on your back foot.  Keep your right / left heel on the floor, bend your back knee, and slightly shift your weight over the back leg so that you feel a gentle stretch deep in your back calf.  Hold this position for __________ seconds. Repeat __________ times. Complete this stretch __________ times per day. STRETCH - Gastrocsoleus, Standing  Note: This exercise can place a lot of stress on your foot and ankle. Please complete this exercise only if specifically instructed by your caregiver.   Place the ball of your right / left foot on a step, keeping your other foot firmly on the same step.  Hold on to the wall or a rail for balance.  Slowly lift your other foot, allowing your body weight to press your heel down over the edge of the step.  You should feel a stretch in your right / left calf.  Hold this position for __________ seconds.  Repeat this exercise with a slight bend in your right / left knee. Repeat __________ times. Complete this stretch __________ times per day.    STRENGTHENING EXERCISES - Plantar Fasciitis (Heel Spur Syndrome)  These exercises may help you when beginning to rehabilitate your injury. They may resolve your symptoms with or without further involvement from your physician, physical therapist or athletic trainer. While completing these exercises, remember:   Muscles can gain both the endurance and the strength needed for everyday activities through controlled exercises.  Complete these exercises as instructed by your physician, physical therapist or athletic trainer. Progress the resistance and repetitions only as guided. STRENGTH -  Towel Curls  Sit in a chair positioned on a non-carpeted surface.  Place your foot on a towel, keeping your heel on the floor.  Pull the towel toward your heel by only curling your toes. Keep your heel on the floor.  If instructed by your physician, physical therapist or athletic trainer, add ____________________ at the end of the towel. Repeat __________ times. Complete this exercise __________ times per day. STRENGTH - Ankle Inversion  Secure one end of a rubber exercise band/tubing to a fixed object (table, pole). Loop the other end around your foot just before your toes.  Place your fists between your knees. This will focus your strengthening at your ankle.  Slowly, pull your big toe up and in, making sure the band/tubing is positioned to resist the entire motion.  Hold this position for __________ seconds.  Have your muscles resist the band/tubing as it slowly pulls your foot back to the starting position. Repeat __________ times. Complete this exercises __________ times per day.  Document Released: 02/14/2005 Document Revised: 05/09/2011 Document Reviewed: 05/29/2008 [employer] Patient Information 2015 [employer]. This information is not intended to replace advice given to you by your health care provider. Make sure you discuss any questions you have with your health care provider.  

## 2014-07-02 NOTE — Progress Notes (Signed)
Patient ID: Christy Cox, female   DOB: [birth year], 39 y.o.   MRN: [id number]  Subjective: 39 year old female presents the office in follow-up evaluation of right heel pain, plantar fasciitis. She states the pain is about the same as what it was before. She does that she stretches the area doesn't hurt at first however does feel better after stretching. She is also continuing with ice. She continues to wear a sandal as she states closed in shoes are bothersome with the MS. Denies any swelling or redness around the area and she denies any numbness or tingling. Denies any systemic complaints such as fevers, chills, nausea, vomiting. No acute changes since last appointment, and no other complaints at this time.   Objective: AAO x3, NAD DP/PT pulses palpable bilaterally, CRT less than 3 seconds Protective sensation intact with Simms Weinstein monofilament, vibratory sensation intact, Achilles tendon reflex intact There is continued tenderness to palpation overlying the plantar medial tubercle of the calcaneus to right heel at the insertion of the plantar fascia. There is no pain along the course of plantar fascial within the arch of the foot and the plantar fascia appears intact. There is no pain with lateral compression of the calcaneus or pain the vibratory sensation. No pain on the posterior aspect of the calcaneus or along the course/insertion of the Achilles tendon. There is no overlying edema, erythema, increase in warmth. No other areas of tenderness palpation or pain with vibratory sensation to the foot/ankle. MMT 5/5, ROM WNL No open lesions or pre-ulcerative lesions are identified. No pain with calf compression, swelling, warmth, erythema.  Assessment: 39 year old female with right continued heel pain, plantar fasciitis  Plan: -All treatment options discussed with the patient including all alternatives, risks, complications.  -Discussed steroid injection, however she wishes to hold  off -Dispensed night splint. -Continue stretching activities -Ice to the area -Again discussed shoe gear modifications. If she continues to wear a sandal did discuss with her various companies that have sealed with the arch support. -Follow-up in 3 weeks or sooner if any problems are to arise. -Patient encouraged to call the office with any questions, concerns, change in symptoms.

## 2014-07-22 ENCOUNTER — Ambulatory Visit (INDEPENDENT_AMBULATORY_CARE_PROVIDER_SITE_OTHER): Payer: Medicare Other | Admitting: Podiatry

## 2014-07-22 ENCOUNTER — Ambulatory Visit (INDEPENDENT_AMBULATORY_CARE_PROVIDER_SITE_OTHER): Payer: Medicare Other

## 2014-07-22 ENCOUNTER — Encounter: Payer: Self-pay | Admitting: Podiatry

## 2014-07-22 DIAGNOSIS — M722 Plantar fascial fibromatosis: Secondary | ICD-10-CM

## 2014-07-22 MED ORDER — MELOXICAM 7.5 MG PO TABS: 7.5000 mg | ORAL_TABLET | Freq: Every day | ORAL | Status: DC

## 2014-07-22 MED ORDER — TRIAMCINOLONE ACETONIDE 10 MG/ML IJ SUSP
10.0000 mg | Freq: Once | INTRAMUSCULAR | Status: AC
  Administered 2014-07-22: 10 mg

## 2014-07-23 NOTE — Progress Notes (Signed)
Patient ID: Christy Cox, female   DOB: 1975-04-16, 39 y.o.   MRN: [id number]  Subjective: Christy Cox returns to the office in follow-up evaluation of right heel pain, plantar fasciitis. She states the pain is about the same as what it was before. She continues with the ice, stretching, plantar fascial brace. She continues to wear a sandal as she states closed in shoes are bothersome with the MS. Denies any swelling or redness around the area and she denies any numbness or tingling. Denies any systemic complaints such as fevers, chills, nausea, vomiting. No acute changes since last appointment, and no other complaints at this time.   Objective: AAO x3, NAD DP/PT pulses palpable bilaterally, CRT less than 3 seconds Protective sensation intact with Simms Weinstein monofilament, vibratory sensation intact, Achilles tendon reflex intact; negative tinel sign  There is continued tenderness to palpation overlying the plantar medial tubercle of the calcaneus to right heel at the insertion of the plantar fascia. There is no pain along the course of plantar fascial within the arch of the foot and the plantar fascia appears intact. There is no pain with lateral compression of the calcaneus or pain the vibratory sensation. No pain on the posterior aspect of the calcaneus or along the course/insertion of the Achilles tendon. There is no overlying edema, erythema, increase in warmth. No other areas of tenderness palpation or pain with vibratory sensation to the foot/ankle. MMT 5/5, ROM WNL No open lesions or pre-ulcerative lesions are identified. No pain with calf compression, swelling, warmth, erythema.  Assessment: 39 year old female with right continued heel pain, plantar fasciitis  Plan: -All treatment options discussed with the patient including all alternatives, risks, complications.  -Patient elects to proceed with steroid injection into the right heel. Under sterile skin preparation, a total of 2.5cc  of kenalog 10, 0.5% Marcaine plain, and 2% lidocaine plain were infiltrated into the symptomatic area without complication. A band-aid was applied. Patient tolerated the injection well without complication. Post-injection care with discussed with the patient. Discussed with the patient to ice the area over the next couple of days to help prevent a steroid flare.  -Plantar fascial taping was applied. Once the tape is removed can continue with the brace.  -Continue night splint. -Continue stretching activities -Ice to the area -Again discussed shoe gear modifications. If she continues to wear a sandal did discuss with her various companies that have sealed with the arch support. -Follow-up in 3 weeks or sooner if any problems are to arise. -Patient encouraged to call the office with any questions, concerns, change in symptoms.

## 2014-07-29 ENCOUNTER — Other Ambulatory Visit: Payer: Self-pay | Admitting: Podiatry

## 2014-08-12 ENCOUNTER — Ambulatory Visit (INDEPENDENT_AMBULATORY_CARE_PROVIDER_SITE_OTHER): Payer: Medicare Other | Admitting: Podiatry

## 2014-08-12 DIAGNOSIS — M722 Plantar fascial fibromatosis: Secondary | ICD-10-CM | POA: Diagnosis not present

## 2014-08-12 NOTE — Progress Notes (Signed)
Patient ID: Christy Cox, female   DOB: Nov 11, 1975, 39 y.o.   MRN: [id number]  Subjective: 39 year old female presents the office they for follow-up evaluation of right heel pain, plantar fasciitis. She states that since last appointment she is actually made for improvement and she is feeling better. She's been continuing the stretching, icing activities. She denies any complications of the injection. She is currently on a Medrol Dosepak for an upper respiratory infection. She denies a motion to plantar feet and denies any swelling or redness. No other complaints at this time. No acute changes since last appointment.   Objective: AAO x3, NAD DP/PT pulses palpable bilaterally, CRT less than 3 seconds Protective sensation intact with Simms Weinstein monofilament, vibratory sensation intact, Achilles tendon reflex intact There is decreased tenderness to palpation overlying the plantar medial tubercle of the calcaneus to right heel at the insertion of the plantar fascia. There is no pain along the course of plantar fascial within the arch of the foot and the plantar fascia appears intact. There is no pain with lateral compression of the calcaneus or pain the vibratory sensation. No pain on the posterior aspect of the calcaneus or along the course/insertion of the Achilles tendon. There is no overlying edema, erythema, increase in warmth. No other areas of tenderness palpation or pain with vibratory sensation to the foot/ankle. MMT 5/5, ROM WNL No open lesions or pre-ulcerative lesions are identified. No pain with calf compression, swelling, warmth, erythema.  Assessment: 39 year old female with improving right heel pain, plantar fasciitis  Plan: -Treatment options discussed including all alternatives, risks, and complications -Discussed another steroid injection however at this time she is currently sick on antibiotic she is asked on a Medrol Dosepak. Over this will help her foot as well. -Continue  icing and stretching activities on a consistent basis. -Discussed shoe gear modifications. She states that she's been wearing a more supportive sandal since last appointment which seems to help as well. Recommended not to go barefoot. -Discussed orthotics have a she does not wear closed in shoes and she would not wear an orthotic. -Follow-up in 3-4 weeks or sooner if any problems are to arise. In the meantime encouraged her to call the office with any questions, concerns, changes symptoms.

## 2014-09-09 ENCOUNTER — Ambulatory Visit: Payer: Medicare Other | Admitting: Podiatry

## 2014-09-30 ENCOUNTER — Encounter: Payer: Self-pay | Admitting: Family Medicine

## 2014-09-30 ENCOUNTER — Ambulatory Visit (INDEPENDENT_AMBULATORY_CARE_PROVIDER_SITE_OTHER): Payer: Medicare Other | Admitting: Family Medicine

## 2014-09-30 ENCOUNTER — Telehealth: Payer: Self-pay

## 2014-09-30 VITALS — BP 111/73 | HR 83 | Temp 97.0°F | Ht 66.3 in | Wt 249.2 lb

## 2014-09-30 DIAGNOSIS — G35 Multiple sclerosis: Secondary | ICD-10-CM | POA: Diagnosis not present

## 2014-09-30 DIAGNOSIS — E669 Obesity, unspecified: Secondary | ICD-10-CM | POA: Diagnosis not present

## 2014-09-30 MED ORDER — NALTREXONE-BUPROPION HCL ER 8-90 MG PO TB12: ORAL_TABLET | ORAL | Status: DC

## 2014-09-30 NOTE — Progress Notes (Signed)
BP 111/73 mmHg  Pulse 83  Temp(Src) 97 F (36.1 C)  Ht 5' 6.3" (1.684 m)  Wt 249 lb 3.2 oz (113.036 kg)  BMI 39.86 kg/m2  SpO2 99%  LMP 09/16/2014 (Exact Date)   Subjective:    Patient ID: Christy Cox, female    DOB: Jul 01, 1975, 39 y.o.   MRN: [id number]  HPI: Christy Cox is a 39 y.o. female  Chief Complaint  Patient presents with  . Establish Care   MS under decent control. Has issues with her bladder, neurogenic bladder Doing self caths now. Does well without having to walk long distances.  Sees urology and neurology and GYN. Up to date on her pap.  Unsure about her Tdap, thinks it was 5 years ago when she broke her leg.  Has been having trouble losing weight. Not able to be as active anymore due to the MS and is unhappy with how much weight she has gained. She is interested in pharmacologic ways of helping her get jump started to lose weight.   Relevant past medical, surgical, family and social history reviewed and updated as indicated. Interim medical history since our last visit reviewed. Allergies and medications reviewed and updated.  Review of Systems  Constitutional: Negative.   Respiratory: Negative.   Genitourinary: Positive for dysuria, urgency, frequency and difficulty urinating.  Skin: Negative.   Psychiatric/Behavioral: Negative.     Per HPI unless specifically indicated above     Objective:    BP 111/73 mmHg  Pulse 83  Temp(Src) 97 F (36.1 C)  Ht 5' 6.3" (1.684 m)  Wt 249 lb 3.2 oz (113.036 kg)  BMI 39.86 kg/m2  SpO2 99%  LMP 09/16/2014 (Exact Date)  Wt Readings from Last 3 Encounters:  09/30/14 249 lb 3.2 oz (113.036 kg)  06/13/14 239 lb 11.2 oz (108.727 kg)  06/10/14 220 lb (99.791 kg)    Physical Exam  Constitutional: She is oriented to person, place, and time. She appears well-developed and well-nourished. No distress.  HENT:  Head: Normocephalic and atraumatic.  Right Ear: Hearing normal.  Left Ear: Hearing normal.   Nose: Nose normal.  Eyes: Conjunctivae and lids are normal. Right eye exhibits no discharge. Left eye exhibits no discharge. No scleral icterus.  Cardiovascular: Normal rate, regular rhythm and normal heart sounds.  Exam reveals no gallop and no friction rub.   No murmur heard. Pulmonary/Chest: Effort normal and breath sounds normal. No respiratory distress.  Musculoskeletal: Normal range of motion.  Neurological: She is alert and oriented to person, place, and time.  Skin: Skin is warm, dry and intact. No rash noted. No erythema. No pallor.  Psychiatric: She has a normal mood and affect. Her speech is normal and behavior is normal. Judgment and thought content normal. Cognition and memory are normal.  Nursing note and vitals reviewed.   Results for orders placed or performed in visit on 06/13/14  CBC with Differential/Platelet  Result Value Ref Range   WBC 5.8 3.4 - 10.8 x10E3/uL   RBC 4.31 3.77 - 5.28 x10E6/uL   Hemoglobin 12.9 11.1 - 15.9 g/dL   HCT 39.3 34.0 - 46.6 %   MCV 91 79 - 97 fL   MCH 29.9 26.6 - 33.0 pg   MCHC 32.8 31.5 - 35.7 g/dL   RDW 15.2 12.3 - 15.4 %   Platelets 157 150 - 379 x10E3/uL   Neutrophils Relative % 71 %   Lymphs 20 %   Monocytes 7 %   Eos 1 %  Basos 1 %   Neutrophils Absolute 4.1 1.4 - 7.0 x10E3/uL   Lymphocytes Absolute 1.2 0.7 - 3.1 x10E3/uL   Monocytes Absolute 0.4 0.1 - 0.9 x10E3/uL   Eosinophils Absolute 0.1 0.0 - 0.4 x10E3/uL   Basophils Absolute 0.0 0.0 - 0.2 x10E3/uL   Immature Granulocytes 0 %   Immature Grans (Abs) 0.0 0.0 - 0.1 x10E3/uL  Comprehensive metabolic panel  Result Value Ref Range   Glucose 72 65 - 99 mg/dL   BUN 13 6 - 20 mg/dL   Creatinine, Ser 0.70 0.57 - 1.00 mg/dL   GFR calc non Af Amer 109 >59 mL/min/1.73   GFR calc Af Amer 126 >59 mL/min/1.73   BUN/Creatinine Ratio 19 8 - 20   Sodium 139 134 - 144 mmol/L   Potassium 4.5 3.5 - 5.2 mmol/L   Chloride 98 97 - 108 mmol/L   CO2 21 18 - 29 mmol/L   Calcium 9.5 8.7 -  10.2 mg/dL   Total Protein 7.4 6.0 - 8.5 g/dL   Albumin 4.7 3.5 - 5.5 g/dL   Globulin, Total 2.7 1.5 - 4.5 g/dL   Albumin/Globulin Ratio 1.7 1.1 - 2.5   Bilirubin Total 1.0 0.0 - 1.2 mg/dL   Alkaline Phosphatase 59 39 - 117 IU/L   AST 13 0 - 40 IU/L   ALT 11 0 - 32 IU/L      Assessment & Plan:   Problem List Items Addressed This Visit      Nervous and Auditory   Multiple sclerosis (Chronic)    Continue to follow with neurology. Continue to monitor.         Other   Obesity - Primary    MS makes exercise difficult. Discussed that we don't do phentermine in this office. Discussed contrave risks and benefits and medication information given to patient today. She will consider it and if she would like to start it will call and we will follow up after 1 month. Work on diet and exercise as tolerated.           Follow up plan: Return if symptoms worsen or fail to improve.

## 2014-09-30 NOTE — Assessment & Plan Note (Signed)
MS makes exercise difficult. Discussed that we don't do phentermine in this office. Discussed contrave risks and benefits and medication information given to patient today. She will consider it and if she would like to start it will call and we will follow up after 1 month. Work on diet and exercise as tolerated.

## 2014-09-30 NOTE — Telephone Encounter (Signed)
Rx sent to her pharmacy. Follow up 1 month.

## 2014-09-30 NOTE — Telephone Encounter (Signed)
Pt called stated she would like Dr. Wynetta Emery to send the RX to the pharmacy that she was given the pamphlet on. Pt stated if you need to call her that is fine. Pharm is [facility] on [address]. Thanks.

## 2014-09-30 NOTE — Assessment & Plan Note (Signed)
Continue to follow with neurology. Continue to monitor.

## 2014-09-30 NOTE — Telephone Encounter (Signed)
Forward to provider, appointment scheduled for 1 month.

## 2014-10-01 ENCOUNTER — Telehealth: Payer: Self-pay

## 2014-10-01 MED ORDER — LIRAGLUTIDE -WEIGHT MANAGEMENT 18 MG/3ML ~~LOC~~ SOPN: 3.0000 mg | PEN_INJECTOR | Freq: Every day | SUBCUTANEOUS | Status: DC

## 2014-10-01 MED ORDER — LORCASERIN HCL 10 MG PO TABS: 10.0000 mg | ORAL_TABLET | Freq: Every day | ORAL | Status: DC

## 2014-10-01 NOTE — Telephone Encounter (Signed)
Patient called, she wants to know if you can send in the other weight loss prescriptions. The one sent in was not covered.

## 2014-10-01 NOTE — Telephone Encounter (Signed)
rx called in

## 2014-10-01 NOTE — Telephone Encounter (Signed)
Please call in belviq, as it apparently can't be sent over. Patient knows to expect medicines. Thanks!

## 2014-11-10 ENCOUNTER — Ambulatory Visit: Payer: Self-pay | Admitting: Family Medicine

## 2014-12-19 ENCOUNTER — Ambulatory Visit: Payer: Medicare Other | Admitting: Diagnostic Neuroimaging

## 2015-01-08 ENCOUNTER — Other Ambulatory Visit: Payer: Self-pay | Admitting: Neurology

## 2015-01-26 ENCOUNTER — Ambulatory Visit (INDEPENDENT_AMBULATORY_CARE_PROVIDER_SITE_OTHER): Payer: Self-pay | Admitting: Neurology

## 2015-01-26 ENCOUNTER — Encounter: Payer: Self-pay | Admitting: Neurology

## 2015-01-26 DIAGNOSIS — G35 Multiple sclerosis: Secondary | ICD-10-CM

## 2015-01-26 NOTE — Progress Notes (Signed)
Patient here for screening visit for [facility] MS drug study

## 2015-01-27 ENCOUNTER — Ambulatory Visit: Payer: Medicare Other | Admitting: Family Medicine

## 2015-01-28 ENCOUNTER — Other Ambulatory Visit: Payer: Self-pay | Admitting: Neurology

## 2015-01-28 DIAGNOSIS — G35 Multiple sclerosis: Secondary | ICD-10-CM

## 2015-02-02 ENCOUNTER — Telehealth: Payer: Self-pay | Admitting: Diagnostic Neuroimaging

## 2015-02-02 NOTE — Telephone Encounter (Signed)
Patient is calling and states she is having heaviness and pain in her legs and has worsened in last days. Please call.

## 2015-02-02 NOTE — Telephone Encounter (Signed)
Spoke with patient who states she "has always had a problem with pain in her right leg". She states she has pain in both legs now and has right leg, hip, shoulder "heaviness". She states she takes Ibuprofen 200 mg or 500 mg prn for the leg pain. She states it is worst in the mornings and when she gets up from a chair. She states she is unsure if this is related to her MS. She states that she will come in for FU with Dr Leta Baptist if she needs to. She has MRI on 02/10/15 and FU with Dr Felecia Shelling 02/13/15, re: research study. She states she is still thinking about the research study because of living in [place]. Informed her would route her questions, concerns to Dr Leta Baptist who returns to office tomorrow, and will call her back with his response. She verbalized understanding, appreciation for call back.

## 2015-02-03 NOTE — Telephone Encounter (Signed)
Keep mri and f/u with dr Felecia Shelling for now. Will check mri results first. -VRP

## 2015-02-03 NOTE — Telephone Encounter (Signed)
Spoke with patient and informed her that Dr Leta Baptist recommends waiting for MRI results, for her to FU with Dr Felecia Shelling. He does not recommend she FU with him until possibly after those appointments due to her living out of town. She stated "I appreciate that." she verbalized understanding, appreciation for call.

## 2015-02-10 ENCOUNTER — Ambulatory Visit
Admission: RE | Admit: 2015-02-10 | Discharge: 2015-02-10 | Disposition: A | Discharge status: Home / Self Care | Payer: No Typology Code available for payment source | Source: Ambulatory Visit | Attending: Neurology | Admitting: Neurology

## 2015-02-10 ENCOUNTER — Telehealth: Payer: Self-pay | Admitting: Neurology

## 2015-02-10 DIAGNOSIS — G35 Multiple sclerosis: Secondary | ICD-10-CM

## 2015-02-10 MED ORDER — GADOBENATE DIMEGLUMINE 529 MG/ML IV SOLN
20.0000 mL | Freq: Once | INTRAVENOUS | Status: AC | PRN
  Administered 2015-02-10: 20 mL via INTRAVENOUS

## 2015-02-10 NOTE — Telephone Encounter (Signed)
Patient called, states she has asked for Christy Cox and has been transferred to [place] 3 times, (confirmed that outgoing message says Clarise Cruz) patient states "well, that's confusing". I apologized to patient, transferred to [employer] for patient to leave message. Information passed along to [facility] to have Teodora change outgoing voicemail to reflect Christy Cox not Clarise Cruz.

## 2015-02-13 ENCOUNTER — Encounter: Payer: Medicare Other | Admitting: Neurology

## 2015-02-17 NOTE — Telephone Encounter (Signed)
Yes, pls give results. -VRP

## 2015-02-17 NOTE — Telephone Encounter (Signed)
Patient called to advise she has decided not to do study with [facility]egin[facility], wants to know what new findings the MRI revealed.

## 2015-02-18 NOTE — Telephone Encounter (Addendum)
Spoke with patient and informed her, per Dr Leta Baptist, her MRI results show no significant changes. She verbalized understanding. Need to reschedule follow up as was unable to schedule on 03/12/15 as planned.  Unable to reach her again, no voice mail. Will call later.   9:43 am  Reached patient and scheduled follow up for 02/25/15. Patient verbalized understanding, agreement.

## 2015-02-24 ENCOUNTER — Ambulatory Visit (INDEPENDENT_AMBULATORY_CARE_PROVIDER_SITE_OTHER): Payer: Medicare Other | Admitting: Sports Medicine

## 2015-02-24 ENCOUNTER — Ambulatory Visit (INDEPENDENT_AMBULATORY_CARE_PROVIDER_SITE_OTHER): Payer: Medicare Other

## 2015-02-24 DIAGNOSIS — M79671 Pain in right foot: Secondary | ICD-10-CM

## 2015-02-24 DIAGNOSIS — S93401A Sprain of unspecified ligament of right ankle, initial encounter: Secondary | ICD-10-CM | POA: Diagnosis not present

## 2015-02-24 DIAGNOSIS — M722 Plantar fascial fibromatosis: Secondary | ICD-10-CM

## 2015-02-24 DIAGNOSIS — M779 Enthesopathy, unspecified: Secondary | ICD-10-CM | POA: Diagnosis not present

## 2015-02-24 MED ORDER — DICLOFENAC SODIUM 75 MG PO TBEC: 75.0000 mg | DELAYED_RELEASE_TABLET | Freq: Two times a day (BID) | ORAL | Status: DC

## 2015-02-24 MED ORDER — METHYLPREDNISOLONE 4 MG PO TBPK: ORAL_TABLET | ORAL | Status: DC

## 2015-02-24 NOTE — Progress Notes (Signed)
Patient ID: Christy Cox, female   DOB: 11-24-1975, 39 y.o.   MRN: [id number]  Subjective: Christy Cox is a 39 y.o. female patient presents to office with complaint of right foot and ankle pain; reports that on Christmas eve she was walking up drive way and fell and hurt her right foot and ankle. States that she went to [facility] where they xray it and gave her pain medication. Patient states that since the injury the pain and discomfort in her foot and ankle is getting better; 5/10. Admits to history of MS. Denies any other pedal complaints at this time.  Patient Active Problem List   Diagnosis Date Noted  . Obesity 09/30/2014  . Pus in urine 04/03/2014  . Acute cystitis 02/19/2014  . 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 Incomplete bladder emptying 05/24/2013  . Urge incontinence 05/24/2013  . FOM (frequency of micturition) 05/24/2013  . Infection of urinary tract 06/06/2012  . Multiple sclerosis ([place]) 06/01/2012  . DS (disseminated sclerosis) ([place]) 05/31/2012   Current Outpatient Prescriptions on File Prior to Visit  Medication Sig Dispense Refill  . Dimethyl Fumarate 240 MG CPDR Take 1 capsule (240 mg total) by mouth 2 (two) times daily. 180 capsule 3  . Liraglutide -Weight Management (SAXENDA) 18 MG/3ML SOPN Inject 3 mg into the skin daily. 30 pen 3  . Lorcaserin HCl (BELVIQ) 10 MG TABS Take 10 mg by mouth daily. 30 tablet 3  . oxybutynin (DITROPAN) 5 MG tablet Take 5 mg by mouth 3 (three) times daily.     No current facility-administered medications on file prior to visit.   Allergies  Allergen Reactions  . Nitrofurantoin Other (See Comments)    Liver problems  . Macrodantin [Nitrofurantoin Macrocrystal]     Caused elevated liver function and jaundice    Objective: Physical Exam General: The patient is alert and oriented x3 in no acute distress.  Dermatology: Skin is warm, dry and supple bilateral lower extremities. Nails 1-10 are normal. There is no erythema,  edema, no eccymosis, no open lesions present. Integument is otherwise unremarkable.  Vascular: Dorsalis Pedis pulse and Posterior Tibial pulse are 2/4 bilateral. Capillary fill time is immediate to all digits.  Neurological: Grossly intact to light touch with an achilles reflex of +2/5 and a  negative Tinel's sign bilateral.  Musculoskeletal: Tenderness to palpation at peroneal tendon sheath, lateral ankle, and lateral calcaneal tubercale and through the insertion of the plantar fascia on the right foot. No ankle instability, No popping/click with range of motion, no pain with compression of calcaneus bilateral. No pain with tuning fork to calcaneus bilateral. No pain with calf compression bilateral. All joints range of motion within normal limits bilateral. Strength 5/5 in all groups bilateral.   Gait: Unassisted, Mildly Antalgic avoid weight on right.   Xray, Right foot: 2 Views Normal osseous mineralization. Joint spaces preserved. No fracture/dislocation/boney destruction. Significant Calcaneal spur present with mild thickening of plantar fascia. Pes cavus foot type. No other soft tissue abnormalities or radiopaque foreign bodies.   Assessment and Plan: Problem List Items Addressed This Visit    None    Visit Diagnoses    Right foot pain    -  Primary    Relevant Medications    methylPREDNISolone (MEDROL DOSEPAK) 4 MG TBPK tablet    diclofenac (VOLTAREN) 75 MG EC tablet    Other Relevant Orders    DG Foot 2 Views Right    Sprain of ankle, right, initial encounter  Relevant Medications    methylPREDNISolone (MEDROL DOSEPAK) 4 MG TBPK tablet    diclofenac (VOLTAREN) 75 MG EC tablet    Tendonitis        Peroneal    Relevant Medications    methylPREDNISolone (MEDROL DOSEPAK) 4 MG TBPK tablet    diclofenac (VOLTAREN) 75 MG EC tablet    Plantar fasciitis, right        Atypical lateral band    Relevant Medications    methylPREDNISolone (MEDROL DOSEPAK) 4 MG TBPK tablet     diclofenac (VOLTAREN) 75 MG EC tablet      -Complete examination performed. Discussed treatment options -Patient declined injection along peroneal sheath at today's exam -Rx Diclofenac and Medrol dose pack to take once dose pack is complete -Dispensed tri-lock ankle support for right to wear daily when ambulating  -Recommend patient to ice affected area 1-2x daily. -Patient to return to office in 2 weeks for follow up or sooner if problems or questions arise.Will consider brace transition and range of motion/stability exercises at next encounter.   Landis Martins, DPM

## 2015-02-25 ENCOUNTER — Encounter: Payer: Self-pay | Admitting: Diagnostic Neuroimaging

## 2015-02-25 ENCOUNTER — Ambulatory Visit (INDEPENDENT_AMBULATORY_CARE_PROVIDER_SITE_OTHER): Payer: Medicare Other | Admitting: Diagnostic Neuroimaging

## 2015-02-25 VITALS — BP 103/70 | HR 83

## 2015-02-25 DIAGNOSIS — E559 Vitamin D deficiency, unspecified: Secondary | ICD-10-CM | POA: Diagnosis not present

## 2015-02-25 DIAGNOSIS — G35 Multiple sclerosis: Secondary | ICD-10-CM | POA: Diagnosis not present

## 2015-02-25 NOTE — Patient Instructions (Addendum)
Thank you for coming to see [place] at [facility]. I hope we have been able to provide you high quality care today.  You may receive a patient satisfaction survey over the next few weeks. We would appreciate your feedback and comments so that we may continue to improve ourselves and the health of our patients.  - continue tecfidera - start vitamin D 1000 units daily   ~~~~~~~~~~~~~~~~~~~~~~~~~~~~~~~~~~~~~~~~~~~~~~~~~~~~~~~~~~~~~~~~~  DR. Kolby Schara'S GUIDE TO HAPPY AND HEALTHY LIVING These are some of my general health and wellness recommendations. Some of them may apply to you better than others. Please use common sense as you try these suggestions and feel free to ask me any questions.   ACTIVITY/FITNESS Mental, social, emotional and physical stimulation are very important for brain and body health. Try learning a new activity (arts, music, language, sports, games).  Keep moving your body to the best of your abilities. You can do this at home, inside or outside, the park, community center, gym or anywhere you like. Consider a physical therapist or personal trainer to get started. Consider the app Sworkit. Fitness trackers such as smart-watches, smart-phones or Fitbits can help as well.   NUTRITION Eat more plants: colorful vegetables, nuts, seeds and berries.  Eat less sugar, salt, preservatives and processed foods.  Avoid toxins such as cigarettes and alcohol.  Drink water when you are thirsty. Warm water with a slice of lemon is an excellent morning drink to start the day.  Consider these websites for more information The Nutrition Source (https://www.henry-hernandez.biz/) Precision Nutrition (WindowBlog.ch)   RELAXATION Consider practicing mindfulness meditation or other relaxation techniques such as deep breathing, prayer, yoga, tai chi, massage. See website mindful.org or the apps Headspace or Calm to help get  started.   SLEEP Try to get at least 7-8+ hours sleep per day. Regular exercise and reduced caffeine will help you sleep better. Practice good sleep hygeine techniques. See website sleep.org for more information.   PLANNING Prepare estate planning, living will, healthcare POA documents. Sometimes this is best planned with the help of an attorney. Theconversationproject.org and agingwithdignity.org are excellent resources.

## 2015-02-25 NOTE — Progress Notes (Signed)
[facility]  PATIENT: Christy Cox DOB: [birth year]   REASON FOR VISIT: follow up HISTORY FROM: patient  Chief Complaint  Patient presents with  . Multiple sclerosis    rm 6, sister -Jonelle Sidle  . Follow-up     HISTORY OF PRESENT ILLNESS:  UPDATE 02/25/15: Since last visit, symptoms overall stable. Considered MS study ([facility]) but now considering SCT at [facility]. Still with MS fatigue.  UPDATE 06/13/14: Since last visit, doing well. Tried amantadine, but didn't help fatigue after 2 weeks, so she stopped it. Otherwise stable with MS sxs. Bladder still challenging (urgency, freq, recurrent UTI, night time awakening).  UPDATE 02/19/14: Since last visit, lost insurance and and ran out of tecfidera in August 2015. Trying to get back on tecfidera now. Feeling more fatigue and leg pain. Now focal neuro sxs. Still struggling with urinary frequency, leading to frequent waking, and then daytime sleepiness.  UPDATE 08/09/13: Since last visit, doing well on tecfidera. No GI side effects. No new neurologic symptoms. Had follow up of bruising on breast with ob/gyn and mamography and u/s. Also s/p bladder botox, with great results.  UPDATE 12/21/12: Since last visit patient has had improvement in her lower extremity weakness and numbness sensation, following IV steroid treatment. MRI of the brain and thoracic spine were reviewed and are unremarkable compared to prior studies. JC virus antibody testing was negative. Patient is now contemplating switching to [place] or Tecfidera. She tolerates Rebif without difficulty, but does feel like she is getting tired of doing injections.  UPDATE 11/07/12 (LL): Patient calls for acute visit. She has been experiencing increased lower extremity weakness from the waist down since last Wednesday.  He arms have not been affected.  She denies recent illness, fevers, UTI symptoms, or cold symptoms. She denies falling but feels very  unsteady.  UPDATE 06/01/12 (VRP): patient presented for annual followup visit for multiple sclerosis. I reviewed prior notes from my colleague Dr. Erling Cruz. Patient is doing well. She continues on rebif injections, but tells me that she is "tired of injections". We discussed oral MS medications. Patient's last neuroimaging studies were in 2004. She's had a fairly stable course since that time. In January 2014, patient was having some right eye painful symptoms without objective evidence of optic neuritis. Therefore she was not started on steroids. Symptoms last for one week and then resolved. Overall patient doing well. Continues to have some right leg weakness and mild gait difficulty. Her urinary control problems are her most severe symptoms at this time, and she is seeing a urologist for management.   PRIOR HPI (03/09/12; Dr. Erling Cruz): 39 year old right-handed white married female from [place], [place] with multiple sclerosis. 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 She then had an MRI of the brain and of the cervical cord in 05/02/2002 showing multiple lesions in the brain some of which enhanced and similar lesions in the cervical spine, none of which enhanced. She has been on Rebif since October 2004 and has done very well. She had courses of high dose IV Solu-Medrol followed by oral prednisone for numbness in her hands, the last 09/2009. She had side effects from the Rebif with a sterile abscess in her right abdomen for which she underwent surgery and had good results. She has complained of her feet  hurting and numbness in her feet in the past. She is currently on disability. She had an episode of optic neuritis 04/13/2004 evaluated by Dr. Philis Kendall in [place]. She was off Rebif for about 9 months due to financial issues and restarted in February,  2010. 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 She has stopped using a tanning bed. Her last MRIs of the cervical spine and brain were 05/02/2002. In May 2010 she developed chest pain and was evaluated with chest x-ray, 2-D echocardiogram, a 12-lead EKG which were normal. March 03, 2009 she fell in her bedroom and fractured her right tibia. She had symptoms of a UTI with frequency and lower abdominal pain. She was initially placed on Cipro for 5 days, but had a resistant organism and then was placed on Macrodantin. She developed elevated liver function tests with jaundice and a bilirubin of 7.2. She was seen by Dr. Earlean Shawl who recognized her symptoms were secondary to Macrodantin and her liver dysfunction cleared. Abdominal ultrasound 03/17/2009 showed multiple gallstones. She has nocturia every 1/2 hour and is followed by Dr. Jacqlyn Larsen. She has tried Imipramine and Oxybutynin. She got relief from [facility] which her insurance will not cover.She denies injections problems with Rebif.She had plantar fasciitis on the right foot. One week ago she had cold like symptoms. 03/07/2012 she developed right eye pain when moving her eye. There was no loss of vision. She treated this with Excedrin. Today it is not as sore. Her neurologic review systems is otherwise negative.   REVIEW OF SYSTEMS: Full 14 system review of systems performed and notable only for as per HPI, otherwise negative.    ALLERGIES: Allergies  Allergen Reactions  . Nitrofurantoin Other (See Comments)    Liver problems  . Macrodantin [Nitrofurantoin Macrocrystal]     Caused elevated liver function and jaundice    HOME MEDICATIONS:  Outpatient Prescriptions Prior to Visit  Medication Sig Dispense Refill  . diclofenac (VOLTAREN) 75 MG EC tablet Take 1 tablet (75 mg total) by mouth 2 (two) times daily. 30 tablet 0  .  Dimethyl Fumarate 240 MG CPDR Take 1 capsule (240 mg total) by mouth 2 (two) times daily. 180 capsule 3  . methylPREDNISolone (MEDROL DOSEPAK) 4 MG TBPK tablet Take as instructed 21 tablet 0  . oxybutynin (DITROPAN) 5 MG tablet Take 5 mg by mouth 3 (three) times daily.    . Liraglutide -Weight Management (SAXENDA) 18 MG/3ML SOPN Inject 3 mg into the skin daily. 30 pen 3  . Lorcaserin HCl (BELVIQ) 10 MG TABS Take 10 mg by mouth daily. 30 tablet 3   No facility-administered medications prior to visit.    PAST MEDICAL HISTORY: Past Medical History  Diagnosis Date  . Abscess, abdomen ([place])     H/o sterile abscess in right abd  . Bladder disorder, other     Dystonic bladder  . Multiple sclerosis ([place])     c/b transverse myelitis and optic neuritis    PAST SURGICAL HISTORY: Past Surgical History  Procedure Laterality Date  . Cesarean section  1999, 2003  . Tubes tied    . Hernia repair    . Bladder botox      FAMILY HISTORY: Family History  Problem Relation Age of Onset  . Cancer Mother 24    Breast  . Heart attack Father 67  x2 at 22 them at 51, now s/p CABG  . Cancer Other   . Coronary artery disease Other   . Diabetes Other   . Cancer Paternal Grandmother     SOCIAL HISTORY: Social History   Social History  . Marital Status: Married    Spouse Name: dennis  . Number of Children: 2  . Years of Education: College   Occupational History  . n/a     disabled   Social History Main Topics  . Smoking status: Never Smoker   . Smokeless tobacco: Never Used  . Alcohol Use: Yes     Comment: on occasion, socially  . Drug Use: No  . Sexual Activity: Yes    Birth Control/ Protection: Surgical   Other Topics Concern  . Not on file   Social History Narrative   Pt lives at home with her spouse.    Caffeine Use- 1-2 per week     PHYSICAL EXAM  Filed Vitals:   02/25/15 1323  BP: 103/70  Pulse: 83   There is no weight on file to calculate BMI.  GENERAL  EXAM: Patient is in no distress  CARDIOVASCULAR: Regular rate and rhythm, no murmurs, no carotid bruits  NEUROLOGIC: MENTAL STATUS: awake, alert, language fluent, comprehension intact, naming intact CRANIAL NERVE: pupils equal and reactive to light, visual fields full to confrontation, extraocular muscles intact, no nystagmus, facial sensation and strength symmetric, uvula midline, shoulder shrug symmetric, tongue midline. MOTOR: normal bulk and tone, full strength in the BUE, BLE; EXCEPT RIGHT HF 4+ AND DF 4 SENSORY: normal and symmetric to light touch, temp and vibration COORDINATION: finger-nose-finger, fine finger movements normal REFLEXES: deep tendon reflexes present, 2+ and symmetric GAIT/STATION: narrow based gait; LIMPING ON RIGHT HEEL (FROM INJURY FROM PAST Friday)   DIAGNOSTIC DATA (LABS, IMAGING, TESTING)  Lab Results  Component Value Date   WBC 5.8 06/13/2014   HGB 12.9 06/13/2014   HCT 39.3 06/13/2014   MCV 91 06/13/2014   PLT 157 06/13/2014   CMP     Component Value Date/Time   NA 139 06/13/2014 1042   K 4.5 06/13/2014 1042   CL 98 06/13/2014 1042   CO2 21 06/13/2014 1042   GLUCOSE 72 06/13/2014 1042   BUN 13 06/13/2014 1042   CREATININE 0.70 06/13/2014 1042   CALCIUM 9.5 06/13/2014 1042   PROT 7.4 06/13/2014 1042   ALBUMIN 4.7 06/13/2014 1042   AST 13 06/13/2014 1042   ALT 11 06/13/2014 1042   ALKPHOS 59 06/13/2014 1042   BILITOT 1.0 06/13/2014 1042   BILITOT 0.5 11/29/2012 1045   GFRNONAA 109 06/13/2014 1042   GFRAA 126 06/13/2014 1042   LYMPHOCYTES ABSOLUTE  Date Value Ref Range Status  06/13/2014 1.2 0.7 - 3.1 x10E3/uL Final  08/09/2013 0.9 0.7 - 3.1 x10E3/uL Final  11/29/2012 0.9 0.7 - 3.1 x10E3/uL Final    11/29/12 MRI thoracic spine w/wo - Normal. No definite demyelinating lesions are noted.  11/29/12 JCV antibody (stratify) - 0.20 (NEGATIVE)  12/31/13 MRI brain (with and without) demonstrating: 1. Multiple round and ovoid,  periventricular and subcortical, bilateral cerebellar chronic demyelinating plaques.  2. No abnormal lesions are seen on post contrast views. 3. Compared to MRI on 11/29/12, there is a new chronic left cerebellar plaque, that is not visualized on prior imaging. Otherwise no change.   ASSESSMENT AND PLAN  39 y.o. female with multiple sclerosis, previously on rebif, now on tecfidera since 02/27/13 - August 2015 (stopped due to losing insurance).  Clinically with more fatigue and leg pain, and 1 new chronic plaque. Then restarted tecfidera in Dec 2015. Overall stable symptoms. MRIs are stable.    PLAN: 1. Continue tecfidera 2. Check labs  Orders Placed This Encounter  Procedures  . CBC with Differential/Platelet  . VITAMIN D 25 Hydroxy (Vit-D Deficiency, Fractures)  . Comprehensive metabolic panel  . Stratify JCV Antibody Test ([facility])   Return in about 6 months (around 08/26/2015).     Penni Bombard, MD [id number], [id number] PM Certified in Neurology, Neurophysiology and Neuroimaging  [facility] [address], [place], [place] [address] [phone]

## 2015-02-25 NOTE — Addendum Note (Signed)
Addended byAndrey Spearman on: 02/25/2015 02:32 PM   Modules accepted: Level of Service

## 2015-02-26 ENCOUNTER — Ambulatory Visit: Payer: Medicare Other | Admitting: Family Medicine

## 2015-02-26 LAB — CBC WITH DIFFERENTIAL/PLATELET
Basophils Absolute: 0 10*3/uL (ref 0.0–0.2)
Basos: 0 %
EOS (ABSOLUTE): 0.1 10*3/uL (ref 0.0–0.4)
Eos: 2 %
Hematocrit: 34.8 % (ref 34.0–46.6)
Hemoglobin: 11.6 g/dL (ref 11.1–15.9)
Immature Grans (Abs): 0 10*3/uL (ref 0.0–0.1)
Immature Granulocytes: 0 %
Lymphocytes Absolute: 1.3 10*3/uL (ref 0.7–3.1)
Lymphs: 23 %
MCH: 29.8 pg (ref 26.6–33.0)
MCHC: 33.3 g/dL (ref 31.5–35.7)
MCV: 90 fL (ref 79–97)
Monocytes Absolute: 0.6 10*3/uL (ref 0.1–0.9)
Monocytes: 10 %
Neutrophils Absolute: 3.8 10*3/uL (ref 1.4–7.0)
Neutrophils: 65 %
Platelets: 176 10*3/uL (ref 150–379)
RBC: 3.89 x10E6/uL (ref 3.77–5.28)
RDW: 14.4 % (ref 12.3–15.4)
WBC: 5.8 10*3/uL (ref 3.4–10.8)

## 2015-02-26 LAB — COMPREHENSIVE METABOLIC PANEL
ALT: 12 IU/L (ref 0–32)
AST: 18 IU/L (ref 0–40)
Albumin/Globulin Ratio: 1.8 (ref 1.1–2.5)
Albumin: 4.2 g/dL (ref 3.5–5.5)
Alkaline Phosphatase: 61 IU/L (ref 39–117)
BUN/Creatinine Ratio: 14 (ref 8–20)
BUN: 8 mg/dL (ref 6–20)
Bilirubin Total: 0.5 mg/dL (ref 0.0–1.2)
CO2: 26 mmol/L (ref 18–29)
Calcium: 8.4 mg/dL — ABNORMAL LOW (ref 8.7–10.2)
Chloride: 101 mmol/L (ref 96–106)
Creatinine, Ser: 0.58 mg/dL (ref 0.57–1.00)
GFR calc Af Amer: 134 mL/min/{1.73_m2} (ref >59)
GFR calc non Af Amer: 116 mL/min/{1.73_m2} (ref >59)
Globulin, Total: 2.3 g/dL (ref 1.5–4.5)
Glucose: 81 mg/dL (ref 65–99)
Potassium: 4 mmol/L (ref 3.5–5.2)
Sodium: 137 mmol/L (ref 134–144)
Total Protein: 6.5 g/dL (ref 6.0–8.5)

## 2015-02-26 LAB — VITAMIN D 25 HYDROXY (VIT D DEFICIENCY, FRACTURES): Vit D, 25-Hydroxy: 38.3 ng/mL (ref 30.0–100.0)

## 2015-03-03 DIAGNOSIS — R339 Retention of urine, unspecified: Secondary | ICD-10-CM | POA: Diagnosis not present

## 2015-03-03 DIAGNOSIS — N3281 Overactive bladder: Secondary | ICD-10-CM | POA: Diagnosis not present

## 2015-03-03 DIAGNOSIS — G35 Multiple sclerosis: Secondary | ICD-10-CM | POA: Diagnosis not present

## 2015-03-06 ENCOUNTER — Telehealth: Payer: Self-pay | Admitting: *Deleted

## 2015-03-06 NOTE — Telephone Encounter (Addendum)
Spoke with patient to clarify what documents she needs for [employer]. She is unsure and will discuss with her mother. When papers are complete, she requests they be faxed directly to her mother's fax. Patient will call back with fax number, any further instructions and give to phone staff if this RN is not available. She also wanted Dr Leta Baptist to be aware that she has fallen x 3 since 02/25/15, she states due to balance issues. She did fall once prior to 02/25/15 and injured her ankle. She is no longer wearing the ankle/leg brace. Advised she be very cautious to try and avoid more falls.   1:09 pm  Attempted to reach patient by phone; voice mailbox not set up, unable to leave message.

## 2015-03-10 ENCOUNTER — Encounter: Payer: Self-pay | Admitting: *Deleted

## 2015-03-10 ENCOUNTER — Ambulatory Visit (INDEPENDENT_AMBULATORY_CARE_PROVIDER_SITE_OTHER): Payer: Medicare Other | Admitting: Sports Medicine

## 2015-03-10 ENCOUNTER — Encounter: Payer: Self-pay | Admitting: Sports Medicine

## 2015-03-10 DIAGNOSIS — M722 Plantar fascial fibromatosis: Secondary | ICD-10-CM

## 2015-03-10 DIAGNOSIS — S93401D Sprain of unspecified ligament of right ankle, subsequent encounter: Secondary | ICD-10-CM | POA: Diagnosis not present

## 2015-03-10 DIAGNOSIS — M779 Enthesopathy, unspecified: Secondary | ICD-10-CM

## 2015-03-10 DIAGNOSIS — M79671 Pain in right foot: Secondary | ICD-10-CM | POA: Diagnosis not present

## 2015-03-10 NOTE — Telephone Encounter (Signed)
Pt called, mother's fax # [phone] and it comes directly to her mother's desk.

## 2015-03-10 NOTE — Progress Notes (Signed)
Patient ID: Christy Cox, female   DOB: 18-Jul-1975, 40 y.o.   MRN: [id number]  Subjective: Christy Cox is a 40 y.o. female patient returns to office for follow up eval of right foot and ankle pain; reports that pain is much better. Patient states that she tolerated the brace and meds fine. Denies any other pedal complaints at this time.  Patient Active Problem List   Diagnosis Date Noted  . Obesity 09/30/2014  . Pus in urine 04/03/2014  . Acute cystitis 02/19/2014  . 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 Incomplete bladder emptying 05/24/2013  . Urge incontinence 05/24/2013  . FOM (frequency of micturition) 05/24/2013  . Infection of urinary tract 06/06/2012  . Multiple sclerosis ([place]) 06/01/2012  . 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 diclofenac (VOLTAREN) 75 MG EC tablet Take 1 tablet (75 mg total) by mouth 2 (two) times daily. 30 tablet 0  . Dimethyl Fumarate 240 MG CPDR Take 1 capsule (240 mg total) by mouth 2 (two) times daily. 180 capsule 3  . methylPREDNISolone (MEDROL DOSEPAK) 4 MG TBPK tablet Take as instructed 21 tablet 0  . oxybutynin (DITROPAN) 5 MG tablet Take 5 mg by mouth 3 (three) times daily.     No current facility-administered medications on file prior to visit.   Allergies  Allergen Reactions  . Nitrofurantoin Other (See Comments)    Liver problems  . Macrodantin [Nitrofurantoin Macrocrystal]     Caused elevated liver function and jaundice    Objective: Physical Exam General: The patient is alert and oriented x3 in no acute distress.  Dermatology: Skin is warm, dry and supple bilateral lower extremities. Nails 1-10 are normal. There is no erythema, edema, no eccymosis, no open lesions present. Integument is otherwise unremarkable.  Vascular: Dorsalis Pedis pulse and Posterior Tibial pulse are 2/4 bilateral. Capillary fill time is immediate to  all digits.  Neurological: Grossly intact to light touch with an achilles reflex of +2/5 and a  negative Tinel's sign bilateral.  Musculoskeletal: No Tenderness to palpation at peroneal tendon sheath, lateral ankle on right. Decreased tenderness to calcaneal tubercale and through the insertion of the plantar fascia on the right foot. No ankle instability, No popping/click with range of motion, no pain with compression of calcaneus bilateral. No pain with tuning fork to calcaneus bilateral. No pain with calf compression bilateral. All joints range of motion within normal limits bilateral. Strength 5/5 in all groups bilateral.   Assessment and Plan: Problem List Items Addressed This Visit    None    Visit Diagnoses    Right foot pain    -  Primary    Much improved    Sprain of ankle, right, subsequent encounter        Resolved     Tendonitis        Resolved    Plantar fasciitis, right        Improving      -Complete examination performed. -Patient is much improved; advised patient to transition from [place] brace to fascial brace and slowly wean from use -Patient to finish Voltaren as Rx  -Cont with good supportive shoes and inserts daily -Recommend stretching and range of motion/stability exercises for right ankle daily  -Recommend patient to ice affected area 1-2x daily as needed -Patient to return to office as needed for follow up or sooner if problems or questions arise. Advised patient that if she  has a flare up in pain to put Tri-lock brace back on and come to office immediately for re-eval  Landis Martins, DPM

## 2015-03-10 NOTE — Telephone Encounter (Addendum)
Letter composed, on Dr [employer] desk for review, signature.  1: 39 pm papers successfully faxed per pt's request.

## 2015-03-18 ENCOUNTER — Ambulatory Visit (INDEPENDENT_AMBULATORY_CARE_PROVIDER_SITE_OTHER): Payer: Commercial Managed Care - HMO | Admitting: Unknown Physician Specialty

## 2015-03-18 ENCOUNTER — Encounter: Payer: Self-pay | Admitting: Unknown Physician Specialty

## 2015-03-18 VITALS — BP 101/71 | HR 67 | Temp 97.9°F | Ht 66.5 in | Wt 262.0 lb

## 2015-03-18 DIAGNOSIS — R109 Unspecified abdominal pain: Secondary | ICD-10-CM

## 2015-03-18 LAB — CBC WITH DIFFERENTIAL/PLATELET
Hematocrit: 34.8 % (ref 34.0–46.6)
Hemoglobin: 11.4 g/dL (ref 11.1–15.9)
Lymphocytes Absolute: 1.2 10*3/uL (ref 0.7–3.1)
Lymphs: 24 %
MCH: 30.2 pg (ref 26.6–33.0)
MCHC: 32.8 g/dL (ref 31.5–35.7)
MCV: 92 fL (ref 79–97)
MID (Absolute): 0.4 10*3/uL (ref 0.1–1.6)
MID: 9 %
Neutrophils Absolute: 3.3 10*3/uL (ref 1.4–7.0)
Neutrophils: 68 %
Platelets: 140 10*3/uL — ABNORMAL LOW (ref 150–379)
RBC: 3.78 x10E6/uL (ref 3.77–5.28)
RDW: 14.7 % (ref 12.3–15.4)
WBC: 4.9 10*3/uL (ref 3.4–10.8)

## 2015-03-18 MED ORDER — CIPROFLOXACIN HCL 250 MG PO TABS: 250.0000 mg | ORAL_TABLET | Freq: Two times a day (BID) | ORAL | Status: DC

## 2015-03-18 NOTE — Progress Notes (Signed)
BP 101/71 mmHg  Pulse 67  Temp(Src) 97.9 F (36.6 C)  Ht 5' 6.5" (1.689 m)  Wt 262 lb (118.842 kg)  BMI 41.66 kg/m2  SpO2 98%  LMP 03/13/2015 (Exact Date)   Subjective:    Patient ID: Christy Cox, female    DOB: Sep 23, 1975, 40 y.o.   MRN: [id number]  HPI: Christy Cox is a 40 y.o. female  Chief Complaint  Patient presents with  . Abdominal Pain    pt states she has been having stomach pain for about 6 days now   Abdominal Pain This is a new problem. The current episode started in the past 7 days. The onset quality is gradual. The problem occurs constantly. The problem has been waxing and waning. Pain location: she thinks lower abdominal but has MS. The quality of the pain is cramping. The abdominal pain does not radiate. Associated symptoms include nausea. Pertinent negatives include no constipation, diarrhea or weight loss. Associated symptoms comments: Self caths due to MS. Nothing aggravates the pain. The pain is relieved by nothing. She has tried nothing for the symptoms. MS  Pt has difficulty localizing her pain since she has MS.  She has been told in the past she has gallstones.  LMP was a few days ago and lasted 2 days which is typical for her   Relevant past medical, surgical, family and social history reviewed and updated as indicated. Interim medical history since our last visit reviewed. Allergies and medications reviewed and updated.  Review of Systems  Constitutional: Negative for weight loss.  Gastrointestinal: Positive for nausea and abdominal pain. Negative for diarrhea and constipation.    Per HPI unless specifically indicated above     Objective:    BP 101/71 mmHg  Pulse 67  Temp(Src) 97.9 F (36.6 C)  Ht 5' 6.5" (1.689 m)  Wt 262 lb (118.842 kg)  BMI 41.66 kg/m2  SpO2 98%  LMP 03/13/2015 (Exact Date)  Wt Readings from Last 3 Encounters:  03/18/15 262 lb (118.842 kg)  09/30/14 249 lb 3.2 oz (113.036 kg)  06/13/14 239 lb 11.2  oz (108.727 kg)    Physical Exam  Constitutional: She is oriented to person, place, and time. She appears well-developed and well-nourished. No distress.  HENT:  Head: Normocephalic and atraumatic.  Eyes: Conjunctivae and lids are normal. Right eye exhibits no discharge. Left eye exhibits no discharge. No scleral icterus.  Neck: Normal range of motion. Neck supple. No JVD present. Carotid bruit is not present.  Cardiovascular: Normal rate, regular rhythm and normal heart sounds.   Pulmonary/Chest: Effort normal and breath sounds normal.  Abdominal: Soft. Normal appearance and bowel sounds are normal. She exhibits no mass. There is no splenomegaly or hepatomegaly. There is no rebound and no guarding.  Musculoskeletal: Normal range of motion.  Neurological: She is alert and oriented to person, place, and time.  Skin: Skin is warm, dry and intact. No rash noted. No pallor.  Psychiatric: She has a normal mood and affect. Her behavior is normal. Judgment and thought content normal.   Urine positive which is typical for her.   WBCs are normal.  Amylase and Lipase are pending.    Results for orders placed or performed in visit on 02/25/15  CBC with Differential/Platelet  Result Value Ref Range   WBC 5.8 3.4 - 10.8 x10E3/uL   RBC 3.89 3.77 - 5.28 x10E6/uL   Hemoglobin 11.6 11.1 - 15.9 g/dL   Hematocrit 34.8 34.0 - 46.6 %  MCV 90 79 - 97 fL   MCH 29.8 26.6 - 33.0 pg   MCHC 33.3 31.5 - 35.7 g/dL   RDW 14.4 12.3 - 15.4 %   Platelets 176 150 - 379 x10E3/uL   Neutrophils 65 %   Lymphs 23 %   Monocytes 10 %   Eos 2 %   Basos 0 %   Neutrophils Absolute 3.8 1.4 - 7.0 x10E3/uL   Lymphocytes Absolute 1.3 0.7 - 3.1 x10E3/uL   Monocytes Absolute 0.6 0.1 - 0.9 x10E3/uL   EOS (ABSOLUTE) 0.1 0.0 - 0.4 x10E3/uL   Basophils Absolute 0.0 0.0 - 0.2 x10E3/uL   Immature Granulocytes 0 %   Immature Grans (Abs) 0.0 0.0 - 0.1 x10E3/uL  VITAMIN D 25 Hydroxy (Vit-D Deficiency, Fractures)  Result Value Ref  Range   Vit D, 25-Hydroxy 38.3 30.0 - 100.0 ng/mL  Comprehensive metabolic panel  Result Value Ref Range   Glucose 81 65 - 99 mg/dL   BUN 8 6 - 20 mg/dL   Creatinine, Ser 0.58 0.57 - 1.00 mg/dL   GFR calc non Af Amer 116 >59 mL/min/1.73   GFR calc Af Amer 134 >59 mL/min/1.73   BUN/Creatinine Ratio 14 8 - 20   Sodium 137 134 - 144 mmol/L   Potassium 4.0 3.5 - 5.2 mmol/L   Chloride 101 96 - 106 mmol/L   CO2 26 18 - 29 mmol/L   Calcium 8.4 (L) 8.7 - 10.2 mg/dL   Total Protein 6.5 6.0 - 8.5 g/dL   Albumin 4.2 3.5 - 5.5 g/dL   Globulin, Total 2.3 1.5 - 4.5 g/dL   Albumin/Globulin Ratio 1.8 1.1 - 2.5   Bilirubin Total 0.5 0.0 - 1.2 mg/dL   Alkaline Phosphatase 61 39 - 117 IU/L   AST 18 0 - 40 IU/L   ALT 12 0 - 32 IU/L      Assessment & Plan:   Problem List Items Addressed This Visit    None    Visit Diagnoses    Abdominal pain, unspecified abdominal location    -  Primary    complex differential for this pt with MS: UTI (positive urine and self caths, constipation, gallstones or ovarian cyst    Relevant Medications    ciprofloxacin (CIPRO) 250 MG tablet    Other Relevant Orders    UA/M w/rflx Culture, Routine    CBC With Differential/Platelet    Amylase    Lipase    Comprehensive metabolic panel    US Abdomen Complete    US OB Transvaginal       Will treat for a UTI with Cipro 250 BID.  Will wait for reflex.  She does have a Dealer she will contact.  With history of gallstones, I would also like to get pelvic and abdominal ultrasound  Follow up plan: Return if symptoms worsen or fail to improve, for and with imaging results.

## 2015-03-19 LAB — COMPREHENSIVE METABOLIC PANEL
ALT: 14 IU/L (ref 0–32)
AST: 17 IU/L (ref 0–40)
Albumin/Globulin Ratio: 1.6 (ref 1.1–2.5)
Albumin: 4 g/dL (ref 3.5–5.5)
Alkaline Phosphatase: 61 IU/L (ref 39–117)
BUN/Creatinine Ratio: 14 (ref 8–20)
BUN: 9 mg/dL (ref 6–20)
Bilirubin Total: 0.7 mg/dL (ref 0.0–1.2)
CO2: 26 mmol/L (ref 18–29)
Calcium: 8.9 mg/dL (ref 8.7–10.2)
Chloride: 102 mmol/L (ref 96–106)
Creatinine, Ser: 0.64 mg/dL (ref 0.57–1.00)
GFR calc Af Amer: 130 mL/min/{1.73_m2} (ref >59)
GFR calc non Af Amer: 113 mL/min/{1.73_m2} (ref >59)
Globulin, Total: 2.5 g/dL (ref 1.5–4.5)
Glucose: 78 mg/dL (ref 65–99)
Potassium: 4.1 mmol/L (ref 3.5–5.2)
Sodium: 140 mmol/L (ref 134–144)
Total Protein: 6.5 g/dL (ref 6.0–8.5)

## 2015-03-19 LAB — AMYLASE: Amylase: 30 U/L — ABNORMAL LOW (ref 31–124)

## 2015-03-19 LAB — LIPASE: Lipase: 22 U/L (ref 0–59)

## 2015-03-22 LAB — UA/M W/RFLX CULTURE, ROUTINE
Bilirubin, UA: NEGATIVE
Glucose, UA: NEGATIVE
Ketones, UA: NEGATIVE
Nitrite, UA: NEGATIVE
Protein, UA: NEGATIVE
Specific Gravity, UA: 1.015 (ref 1.005–1.030)
Urobilinogen, Ur: 0.2 mg/dL (ref 0.2–1.0)
pH, UA: 6 (ref 5.0–7.5)

## 2015-03-22 LAB — MICROSCOPIC EXAMINATION

## 2015-03-22 LAB — URINE CULTURE, REFLEX

## 2015-03-23 ENCOUNTER — Other Ambulatory Visit: Payer: Self-pay | Admitting: Sports Medicine

## 2015-03-24 NOTE — Telephone Encounter (Signed)
No future refills until pt is reevaluated.

## 2015-03-25 ENCOUNTER — Telehealth: Payer: Self-pay | Admitting: *Deleted

## 2015-03-25 NOTE — Telephone Encounter (Signed)
Per Dr Leta Baptist, spoke with patient and informed her that JCV antibody test is negative, no change in medications or treatment plan. She verbalized understanding.

## 2015-03-31 DIAGNOSIS — G35 Multiple sclerosis: Secondary | ICD-10-CM | POA: Diagnosis not present

## 2015-04-01 DIAGNOSIS — R339 Retention of urine, unspecified: Secondary | ICD-10-CM | POA: Diagnosis not present

## 2015-04-01 DIAGNOSIS — N3281 Overactive bladder: Secondary | ICD-10-CM | POA: Diagnosis not present

## 2015-04-01 DIAGNOSIS — G35 Multiple sclerosis: Secondary | ICD-10-CM | POA: Diagnosis not present

## 2015-04-08 ENCOUNTER — Telehealth: Payer: Self-pay | Admitting: Diagnostic Neuroimaging

## 2015-04-08 NOTE — Telephone Encounter (Signed)
Patient called requesting for nurse to call back regarding scheduling steroid IV Infusion, states she has been approved for stem cell transplant and has to come off all MS medications, will need monthly IV infusions.

## 2015-04-09 NOTE — Telephone Encounter (Signed)
Spoke with patient and informed her that Dr Leta Baptist needs specific information re: IV steroids for her. She stated she was seen 04/01/15 by the dr in [place], was immediately taken off Tecfidera and was told she would need at least monthly IV steroids. She stated that this office was supposed to have received information from dr in [place]. Informed her that this RN has not seen any info, gave her fax to this RN. She stated she would call today and request [place] fax info requested. She verbalized understanding, appreciation.

## 2015-04-13 NOTE — Telephone Encounter (Signed)
Spoke with patient and informed her that this office has not received her information from dr in [place]. She then stated she "gave [place] the wrong information". She stated she was confused the day she saw dr in [place] due to "having so much going on". She stated that if she has problems she is to contact us for infusion. She does not need scheduled infusions. She verbalized appreciation for call back.

## 2015-04-13 NOTE — Telephone Encounter (Signed)
Noted  

## 2015-04-23 ENCOUNTER — Other Ambulatory Visit: Payer: Self-pay | Admitting: Sports Medicine

## 2015-04-23 ENCOUNTER — Telehealth: Payer: Self-pay | Admitting: Diagnostic Neuroimaging

## 2015-04-23 NOTE — Telephone Encounter (Signed)
Christy Cox with [facility] [phone] called requesting refill for tecfidera 240mg .

## 2015-04-23 NOTE — Telephone Encounter (Signed)
Called patient to inform her of tecfidera med refill called in; mailbox full, could not leave message. If pt calls back, phone staff may inform.

## 2015-04-27 ENCOUNTER — Encounter: Payer: Self-pay | Admitting: *Deleted

## 2015-04-29 DIAGNOSIS — G35 Multiple sclerosis: Secondary | ICD-10-CM | POA: Diagnosis not present

## 2015-04-29 DIAGNOSIS — R339 Retention of urine, unspecified: Secondary | ICD-10-CM | POA: Diagnosis not present

## 2015-04-29 DIAGNOSIS — N3281 Overactive bladder: Secondary | ICD-10-CM | POA: Diagnosis not present

## 2015-05-06 ENCOUNTER — Other Ambulatory Visit: Payer: Self-pay | Admitting: Sports Medicine

## 2015-05-06 DIAGNOSIS — G35 Multiple sclerosis: Secondary | ICD-10-CM | POA: Diagnosis not present

## 2015-05-06 DIAGNOSIS — R339 Retention of urine, unspecified: Secondary | ICD-10-CM | POA: Diagnosis not present

## 2015-05-06 DIAGNOSIS — N3281 Overactive bladder: Secondary | ICD-10-CM | POA: Diagnosis not present

## 2015-05-13 ENCOUNTER — Telehealth: Payer: Self-pay | Admitting: Diagnostic Neuroimaging

## 2015-05-13 NOTE — Telephone Encounter (Signed)
Pt called stating she has the MS hug. She wants to know if Dr. Leta Baptist would want to start steroids? Please call and advise [phone]

## 2015-05-13 NOTE — Telephone Encounter (Addendum)
Attempted to reach patient for more information; voice mailbox full, unable to leave message.   4:30 pm Spoke with patient and informed her Dr Leta Baptist would like her to come in for evaluation. Scheduled her for FU tomorrow 11:30 am.

## 2015-05-14 ENCOUNTER — Ambulatory Visit: Payer: Self-pay | Admitting: Diagnostic Neuroimaging

## 2015-05-14 ENCOUNTER — Telehealth: Payer: Self-pay | Admitting: *Deleted

## 2015-05-14 MED ORDER — PREDNISONE 10 MG PO TABS: ORAL_TABLET | ORAL | Status: DC

## 2015-05-14 NOTE — Telephone Encounter (Signed)
i spoke with patient; having MS hug sensation x 4 days. no weakness, numbness or bowel bladder problems. No fevers or chills. Will try oral prednisone course. Rx sent it. Ok to cancel follow up appt. -VRP

## 2015-05-14 NOTE — Telephone Encounter (Addendum)
Called pt to check on status as she did not arrive for her appt today. She stated she misunderstood, thought she was told her appt was tomorrow. She has written Fri 3/17 on her calendar. Call transferred to Dr Leta Baptist for discussion of her recent c/o.

## 2015-06-01 DIAGNOSIS — G35 Multiple sclerosis: Secondary | ICD-10-CM | POA: Diagnosis not present

## 2015-06-01 DIAGNOSIS — R339 Retention of urine, unspecified: Secondary | ICD-10-CM | POA: Diagnosis not present

## 2015-06-01 DIAGNOSIS — N3281 Overactive bladder: Secondary | ICD-10-CM | POA: Diagnosis not present

## 2015-06-10 ENCOUNTER — Encounter: Payer: Self-pay | Admitting: *Deleted

## 2015-06-16 DIAGNOSIS — N3281 Overactive bladder: Secondary | ICD-10-CM | POA: Diagnosis not present

## 2015-06-29 DIAGNOSIS — G35 Multiple sclerosis: Secondary | ICD-10-CM | POA: Diagnosis not present

## 2015-06-29 DIAGNOSIS — R9082 White matter disease, unspecified: Secondary | ICD-10-CM | POA: Diagnosis not present

## 2015-06-29 DIAGNOSIS — N3281 Overactive bladder: Secondary | ICD-10-CM | POA: Diagnosis not present

## 2015-06-29 DIAGNOSIS — R339 Retention of urine, unspecified: Secondary | ICD-10-CM | POA: Diagnosis not present

## 2015-06-29 DIAGNOSIS — G9589 Other specified diseases of spinal cord: Secondary | ICD-10-CM | POA: Diagnosis not present

## 2015-06-30 DIAGNOSIS — Z9484 Stem cells transplant status: Secondary | ICD-10-CM | POA: Diagnosis not present

## 2015-06-30 DIAGNOSIS — Z452 Encounter for adjustment and management of vascular access device: Secondary | ICD-10-CM | POA: Diagnosis not present

## 2015-06-30 DIAGNOSIS — G9589 Other specified diseases of spinal cord: Secondary | ICD-10-CM | POA: Diagnosis not present

## 2015-06-30 DIAGNOSIS — R9082 White matter disease, unspecified: Secondary | ICD-10-CM | POA: Diagnosis not present

## 2015-06-30 DIAGNOSIS — G35 Multiple sclerosis: Secondary | ICD-10-CM | POA: Diagnosis not present

## 2015-07-01 DIAGNOSIS — G35 Multiple sclerosis: Secondary | ICD-10-CM | POA: Diagnosis not present

## 2015-07-01 DIAGNOSIS — G9589 Other specified diseases of spinal cord: Secondary | ICD-10-CM | POA: Diagnosis not present

## 2015-07-01 DIAGNOSIS — R9082 White matter disease, unspecified: Secondary | ICD-10-CM | POA: Diagnosis not present

## 2015-07-02 ENCOUNTER — Telehealth: Payer: Self-pay | Admitting: Diagnostic Neuroimaging

## 2015-07-02 MED ORDER — MECLIZINE HCL 25 MG PO TABS: 25.0000 mg | ORAL_TABLET | Freq: Three times a day (TID) | ORAL | Status: DC | PRN

## 2015-07-02 MED ORDER — PREDNISONE 10 MG PO TABS: ORAL_TABLET | ORAL | Status: DC

## 2015-07-02 NOTE — Telephone Encounter (Signed)
I sent in rx for prednisone 12 day taper. She may take either 12 or 6 day taper depending on how she feels.  Also sent in meclizine. -VRP

## 2015-07-02 NOTE — Telephone Encounter (Signed)
Patient is calling. The patient states she needs to start oral steroid for optic neuritis. Please call and discuss.

## 2015-07-02 NOTE — Telephone Encounter (Signed)
Spoke with patient who stated she saw Dr Beatris Si in [place] for pre-testing in preparation for stem cell transplant and developed headache and pain behind her right eye. She stated she "has had this pain many times before and knows it's optic neuritis". She is requesting prednisone taper. She also stated her vertigo made her nauseous while flying. She has four upcoming flights to [place].  She is requesting prescription for Antivert. Informed her would route her requests to Dr Leta Baptist; confirmed her pharmacy as [facility], [place]. She verbalized understanding, appreciation.

## 2015-07-02 NOTE — Telephone Encounter (Signed)
Spoke with patient to inform prescriptions have been sent to her pharmacy. She inquired when to take Antivert; advised she take 30 minutes prior to flights to prevent nausea. She then stated she will receive chemo and IV steroids when she returns to [place], so she will do a 6 day prednisone taper to avoid overlapping of steroids. Advised she ask pharmacist for taper instructions if not on prescription.  She verbalized understanding, appreciation.

## 2015-07-08 ENCOUNTER — Telehealth: Payer: Self-pay | Admitting: Diagnostic Neuroimaging

## 2015-07-08 ENCOUNTER — Encounter: Payer: Self-pay | Admitting: *Deleted

## 2015-07-08 NOTE — Telephone Encounter (Signed)
Patient states she just spoke with you and that she needs the form that you are faxing for [employer] to have the doctors credentials and can be signed by an RN instead of the doctor. Thanks!

## 2015-07-08 NOTE — Telephone Encounter (Signed)
Had previously spoken with patient who stated she needed form and letter for [employer] today as she is leaving for [facility] tomorrow, to be gone x 2 weeks. There will not be enough time to get letter before next flight. Informed her Dr Leta Baptist returns 07/14/15. She requested he sign that morning, and she will call with new fax number next Tues. With new information as seen in phone note of 07/08/15, 2:04 pm, the letter and form were signed by this RN and faxed to [phone].

## 2015-07-10 DIAGNOSIS — R3915 Urgency of urination: Secondary | ICD-10-CM | POA: Diagnosis not present

## 2015-07-10 DIAGNOSIS — G35 Multiple sclerosis: Secondary | ICD-10-CM | POA: Diagnosis not present

## 2015-07-10 DIAGNOSIS — Z7682 Awaiting organ transplant status: Secondary | ICD-10-CM | POA: Diagnosis not present

## 2015-07-10 DIAGNOSIS — Z5112 Encounter for antineoplastic immunotherapy: Secondary | ICD-10-CM | POA: Diagnosis not present

## 2015-07-10 DIAGNOSIS — R339 Retention of urine, unspecified: Secondary | ICD-10-CM | POA: Diagnosis not present

## 2015-07-11 DIAGNOSIS — Z5112 Encounter for antineoplastic immunotherapy: Secondary | ICD-10-CM | POA: Diagnosis not present

## 2015-07-11 DIAGNOSIS — R3915 Urgency of urination: Secondary | ICD-10-CM | POA: Diagnosis not present

## 2015-07-11 DIAGNOSIS — Z7682 Awaiting organ transplant status: Secondary | ICD-10-CM | POA: Diagnosis not present

## 2015-07-11 DIAGNOSIS — G35 Multiple sclerosis: Secondary | ICD-10-CM | POA: Diagnosis not present

## 2015-07-11 DIAGNOSIS — R339 Retention of urine, unspecified: Secondary | ICD-10-CM | POA: Diagnosis not present

## 2015-07-16 DIAGNOSIS — G35 Multiple sclerosis: Secondary | ICD-10-CM | POA: Diagnosis not present

## 2015-07-20 DIAGNOSIS — G35 Multiple sclerosis: Secondary | ICD-10-CM | POA: Diagnosis not present

## 2015-07-20 DIAGNOSIS — Z452 Encounter for adjustment and management of vascular access device: Secondary | ICD-10-CM | POA: Diagnosis not present

## 2015-07-20 DIAGNOSIS — Z9484 Stem cells transplant status: Secondary | ICD-10-CM | POA: Diagnosis not present

## 2015-07-21 DIAGNOSIS — G35 Multiple sclerosis: Secondary | ICD-10-CM | POA: Diagnosis not present

## 2015-07-21 DIAGNOSIS — Z9484 Stem cells transplant status: Secondary | ICD-10-CM | POA: Diagnosis not present

## 2015-07-21 DIAGNOSIS — Z452 Encounter for adjustment and management of vascular access device: Secondary | ICD-10-CM | POA: Diagnosis not present

## 2015-07-30 DIAGNOSIS — G35 Multiple sclerosis: Secondary | ICD-10-CM | POA: Diagnosis not present

## 2015-07-30 DIAGNOSIS — N3281 Overactive bladder: Secondary | ICD-10-CM | POA: Diagnosis not present

## 2015-07-30 DIAGNOSIS — R339 Retention of urine, unspecified: Secondary | ICD-10-CM | POA: Diagnosis not present

## 2015-07-31 DIAGNOSIS — Z5112 Encounter for antineoplastic immunotherapy: Secondary | ICD-10-CM | POA: Diagnosis not present

## 2015-07-31 DIAGNOSIS — G35 Multiple sclerosis: Secondary | ICD-10-CM | POA: Diagnosis not present

## 2015-07-31 DIAGNOSIS — Z9484 Stem cells transplant status: Secondary | ICD-10-CM | POA: Diagnosis not present

## 2015-07-31 DIAGNOSIS — Z136 Encounter for screening for cardiovascular disorders: Secondary | ICD-10-CM | POA: Diagnosis not present

## 2015-08-01 DIAGNOSIS — G35 Multiple sclerosis: Secondary | ICD-10-CM | POA: Diagnosis not present

## 2015-08-01 DIAGNOSIS — Z5112 Encounter for antineoplastic immunotherapy: Secondary | ICD-10-CM | POA: Diagnosis not present

## 2015-08-01 DIAGNOSIS — Z9484 Stem cells transplant status: Secondary | ICD-10-CM | POA: Diagnosis not present

## 2015-08-01 DIAGNOSIS — Z136 Encounter for screening for cardiovascular disorders: Secondary | ICD-10-CM | POA: Diagnosis not present

## 2015-08-02 DIAGNOSIS — Z5112 Encounter for antineoplastic immunotherapy: Secondary | ICD-10-CM | POA: Diagnosis not present

## 2015-08-02 DIAGNOSIS — G35 Multiple sclerosis: Secondary | ICD-10-CM | POA: Diagnosis not present

## 2015-08-02 DIAGNOSIS — Z136 Encounter for screening for cardiovascular disorders: Secondary | ICD-10-CM | POA: Diagnosis not present

## 2015-08-02 DIAGNOSIS — Z9484 Stem cells transplant status: Secondary | ICD-10-CM | POA: Diagnosis not present

## 2015-08-03 DIAGNOSIS — G35 Multiple sclerosis: Secondary | ICD-10-CM | POA: Diagnosis not present

## 2015-08-03 DIAGNOSIS — Z5112 Encounter for antineoplastic immunotherapy: Secondary | ICD-10-CM | POA: Diagnosis not present

## 2015-08-03 DIAGNOSIS — Z136 Encounter for screening for cardiovascular disorders: Secondary | ICD-10-CM | POA: Diagnosis not present

## 2015-08-03 DIAGNOSIS — Z9484 Stem cells transplant status: Secondary | ICD-10-CM | POA: Diagnosis not present

## 2015-08-04 DIAGNOSIS — Z9484 Stem cells transplant status: Secondary | ICD-10-CM | POA: Diagnosis not present

## 2015-08-04 DIAGNOSIS — Z5112 Encounter for antineoplastic immunotherapy: Secondary | ICD-10-CM | POA: Diagnosis not present

## 2015-08-04 DIAGNOSIS — D649 Anemia, unspecified: Secondary | ICD-10-CM | POA: Diagnosis not present

## 2015-08-04 DIAGNOSIS — G35 Multiple sclerosis: Secondary | ICD-10-CM | POA: Diagnosis not present

## 2015-08-05 DIAGNOSIS — D61810 Antineoplastic chemotherapy induced pancytopenia: Secondary | ICD-10-CM | POA: Diagnosis not present

## 2015-08-05 DIAGNOSIS — Z9484 Stem cells transplant status: Secondary | ICD-10-CM | POA: Diagnosis not present

## 2015-08-05 DIAGNOSIS — G35 Multiple sclerosis: Secondary | ICD-10-CM | POA: Diagnosis not present

## 2015-08-05 DIAGNOSIS — Z5112 Encounter for antineoplastic immunotherapy: Secondary | ICD-10-CM | POA: Diagnosis not present

## 2015-08-06 DIAGNOSIS — Z5112 Encounter for antineoplastic immunotherapy: Secondary | ICD-10-CM | POA: Diagnosis not present

## 2015-08-06 DIAGNOSIS — D61810 Antineoplastic chemotherapy induced pancytopenia: Secondary | ICD-10-CM | POA: Diagnosis not present

## 2015-08-06 DIAGNOSIS — G35 Multiple sclerosis: Secondary | ICD-10-CM | POA: Diagnosis not present

## 2015-08-06 DIAGNOSIS — Z9484 Stem cells transplant status: Secondary | ICD-10-CM | POA: Diagnosis not present

## 2015-08-07 DIAGNOSIS — J9601 Acute respiratory failure with hypoxia: Secondary | ICD-10-CM | POA: Diagnosis not present

## 2015-08-07 DIAGNOSIS — R0902 Hypoxemia: Secondary | ICD-10-CM | POA: Diagnosis not present

## 2015-08-07 DIAGNOSIS — N390 Urinary tract infection, site not specified: Secondary | ICD-10-CM | POA: Diagnosis not present

## 2015-08-07 DIAGNOSIS — Z5112 Encounter for antineoplastic immunotherapy: Secondary | ICD-10-CM | POA: Diagnosis not present

## 2015-08-07 DIAGNOSIS — J811 Chronic pulmonary edema: Secondary | ICD-10-CM | POA: Diagnosis not present

## 2015-08-07 DIAGNOSIS — D61810 Antineoplastic chemotherapy induced pancytopenia: Secondary | ICD-10-CM | POA: Diagnosis not present

## 2015-08-07 DIAGNOSIS — G35 Multiple sclerosis: Secondary | ICD-10-CM | POA: Diagnosis not present

## 2015-08-07 DIAGNOSIS — Z9484 Stem cells transplant status: Secondary | ICD-10-CM | POA: Diagnosis not present

## 2015-08-08 DIAGNOSIS — Z5112 Encounter for antineoplastic immunotherapy: Secondary | ICD-10-CM | POA: Diagnosis not present

## 2015-08-08 DIAGNOSIS — Z9484 Stem cells transplant status: Secondary | ICD-10-CM | POA: Diagnosis not present

## 2015-08-08 DIAGNOSIS — G35 Multiple sclerosis: Secondary | ICD-10-CM | POA: Diagnosis not present

## 2015-08-08 DIAGNOSIS — D61810 Antineoplastic chemotherapy induced pancytopenia: Secondary | ICD-10-CM | POA: Diagnosis not present

## 2015-08-09 DIAGNOSIS — Z9484 Stem cells transplant status: Secondary | ICD-10-CM | POA: Diagnosis not present

## 2015-08-09 DIAGNOSIS — G35 Multiple sclerosis: Secondary | ICD-10-CM | POA: Diagnosis not present

## 2015-08-09 DIAGNOSIS — D61810 Antineoplastic chemotherapy induced pancytopenia: Secondary | ICD-10-CM | POA: Diagnosis not present

## 2015-08-09 DIAGNOSIS — Z5112 Encounter for antineoplastic immunotherapy: Secondary | ICD-10-CM | POA: Diagnosis not present

## 2015-08-10 DIAGNOSIS — Z5112 Encounter for antineoplastic immunotherapy: Secondary | ICD-10-CM | POA: Diagnosis not present

## 2015-08-10 DIAGNOSIS — Z9484 Stem cells transplant status: Secondary | ICD-10-CM | POA: Diagnosis not present

## 2015-08-10 DIAGNOSIS — D61810 Antineoplastic chemotherapy induced pancytopenia: Secondary | ICD-10-CM | POA: Diagnosis not present

## 2015-08-10 DIAGNOSIS — N390 Urinary tract infection, site not specified: Secondary | ICD-10-CM | POA: Diagnosis not present

## 2015-08-10 DIAGNOSIS — J811 Chronic pulmonary edema: Secondary | ICD-10-CM | POA: Diagnosis not present

## 2015-08-10 DIAGNOSIS — R05 Cough: Secondary | ICD-10-CM | POA: Diagnosis not present

## 2015-08-10 DIAGNOSIS — G35 Multiple sclerosis: Secondary | ICD-10-CM | POA: Diagnosis not present

## 2015-08-11 DIAGNOSIS — G35 Multiple sclerosis: Secondary | ICD-10-CM | POA: Diagnosis not present

## 2015-08-11 DIAGNOSIS — Z5112 Encounter for antineoplastic immunotherapy: Secondary | ICD-10-CM | POA: Diagnosis not present

## 2015-08-11 DIAGNOSIS — D61810 Antineoplastic chemotherapy induced pancytopenia: Secondary | ICD-10-CM | POA: Diagnosis not present

## 2015-08-11 DIAGNOSIS — Z9484 Stem cells transplant status: Secondary | ICD-10-CM | POA: Diagnosis not present

## 2015-08-12 DIAGNOSIS — Z9484 Stem cells transplant status: Secondary | ICD-10-CM | POA: Diagnosis not present

## 2015-08-12 DIAGNOSIS — Z5112 Encounter for antineoplastic immunotherapy: Secondary | ICD-10-CM | POA: Diagnosis not present

## 2015-08-12 DIAGNOSIS — D61810 Antineoplastic chemotherapy induced pancytopenia: Secondary | ICD-10-CM | POA: Diagnosis not present

## 2015-08-12 DIAGNOSIS — G35 Multiple sclerosis: Secondary | ICD-10-CM | POA: Diagnosis not present

## 2015-08-13 DIAGNOSIS — D61810 Antineoplastic chemotherapy induced pancytopenia: Secondary | ICD-10-CM | POA: Diagnosis not present

## 2015-08-13 DIAGNOSIS — G35 Multiple sclerosis: Secondary | ICD-10-CM | POA: Diagnosis not present

## 2015-08-13 DIAGNOSIS — Z5112 Encounter for antineoplastic immunotherapy: Secondary | ICD-10-CM | POA: Diagnosis not present

## 2015-08-13 DIAGNOSIS — Z9484 Stem cells transplant status: Secondary | ICD-10-CM | POA: Diagnosis not present

## 2015-08-14 DIAGNOSIS — Z5112 Encounter for antineoplastic immunotherapy: Secondary | ICD-10-CM | POA: Diagnosis not present

## 2015-08-14 DIAGNOSIS — G35 Multiple sclerosis: Secondary | ICD-10-CM | POA: Diagnosis not present

## 2015-08-14 DIAGNOSIS — Z9484 Stem cells transplant status: Secondary | ICD-10-CM | POA: Diagnosis not present

## 2015-08-14 DIAGNOSIS — D61810 Antineoplastic chemotherapy induced pancytopenia: Secondary | ICD-10-CM | POA: Diagnosis not present

## 2015-08-17 DIAGNOSIS — Z9484 Stem cells transplant status: Secondary | ICD-10-CM | POA: Diagnosis not present

## 2015-08-17 DIAGNOSIS — Z09 Encounter for follow-up examination after completed treatment for conditions other than malignant neoplasm: Secondary | ICD-10-CM | POA: Diagnosis not present

## 2015-08-17 DIAGNOSIS — Z79899 Other long term (current) drug therapy: Secondary | ICD-10-CM | POA: Diagnosis not present

## 2015-08-17 DIAGNOSIS — Z792 Long term (current) use of antibiotics: Secondary | ICD-10-CM | POA: Diagnosis not present

## 2015-08-24 DIAGNOSIS — Z09 Encounter for follow-up examination after completed treatment for conditions other than malignant neoplasm: Secondary | ICD-10-CM | POA: Diagnosis not present

## 2015-08-24 DIAGNOSIS — G35 Multiple sclerosis: Secondary | ICD-10-CM | POA: Diagnosis not present

## 2015-08-24 DIAGNOSIS — Z79899 Other long term (current) drug therapy: Secondary | ICD-10-CM | POA: Diagnosis not present

## 2015-08-24 DIAGNOSIS — Z9484 Stem cells transplant status: Secondary | ICD-10-CM | POA: Diagnosis not present

## 2015-08-24 DIAGNOSIS — Z792 Long term (current) use of antibiotics: Secondary | ICD-10-CM | POA: Diagnosis not present

## 2015-08-28 ENCOUNTER — Ambulatory Visit: Payer: Medicare Other | Admitting: Diagnostic Neuroimaging

## 2015-08-31 ENCOUNTER — Ambulatory Visit: Payer: Medicare Other | Admitting: Diagnostic Neuroimaging

## 2015-09-14 DIAGNOSIS — Z09 Encounter for follow-up examination after completed treatment for conditions other than malignant neoplasm: Secondary | ICD-10-CM | POA: Diagnosis not present

## 2015-09-14 DIAGNOSIS — Z9484 Stem cells transplant status: Secondary | ICD-10-CM | POA: Diagnosis not present

## 2015-09-28 DIAGNOSIS — Z09 Encounter for follow-up examination after completed treatment for conditions other than malignant neoplasm: Secondary | ICD-10-CM | POA: Diagnosis not present

## 2015-09-28 DIAGNOSIS — Z792 Long term (current) use of antibiotics: Secondary | ICD-10-CM | POA: Diagnosis not present

## 2015-09-28 DIAGNOSIS — Z9484 Stem cells transplant status: Secondary | ICD-10-CM | POA: Diagnosis not present

## 2015-10-08 DIAGNOSIS — G35 Multiple sclerosis: Secondary | ICD-10-CM | POA: Diagnosis not present

## 2015-10-08 DIAGNOSIS — R262 Difficulty in walking, not elsewhere classified: Secondary | ICD-10-CM | POA: Diagnosis not present

## 2015-10-08 DIAGNOSIS — M6281 Muscle weakness (generalized): Secondary | ICD-10-CM | POA: Diagnosis not present

## 2015-10-12 DIAGNOSIS — Z9484 Stem cells transplant status: Secondary | ICD-10-CM | POA: Diagnosis not present

## 2015-10-12 DIAGNOSIS — Z792 Long term (current) use of antibiotics: Secondary | ICD-10-CM | POA: Diagnosis not present

## 2015-10-12 DIAGNOSIS — Z09 Encounter for follow-up examination after completed treatment for conditions other than malignant neoplasm: Secondary | ICD-10-CM | POA: Diagnosis not present

## 2015-10-13 DIAGNOSIS — R262 Difficulty in walking, not elsewhere classified: Secondary | ICD-10-CM | POA: Diagnosis not present

## 2015-10-13 DIAGNOSIS — M6281 Muscle weakness (generalized): Secondary | ICD-10-CM | POA: Diagnosis not present

## 2015-10-13 DIAGNOSIS — G35 Multiple sclerosis: Secondary | ICD-10-CM | POA: Diagnosis not present

## 2015-10-16 DIAGNOSIS — M6281 Muscle weakness (generalized): Secondary | ICD-10-CM | POA: Diagnosis not present

## 2015-10-16 DIAGNOSIS — R262 Difficulty in walking, not elsewhere classified: Secondary | ICD-10-CM | POA: Diagnosis not present

## 2015-10-16 DIAGNOSIS — G35 Multiple sclerosis: Secondary | ICD-10-CM | POA: Diagnosis not present

## 2015-10-20 DIAGNOSIS — G35 Multiple sclerosis: Secondary | ICD-10-CM | POA: Diagnosis not present

## 2015-10-20 DIAGNOSIS — R262 Difficulty in walking, not elsewhere classified: Secondary | ICD-10-CM | POA: Diagnosis not present

## 2015-10-20 DIAGNOSIS — M6281 Muscle weakness (generalized): Secondary | ICD-10-CM | POA: Diagnosis not present

## 2015-10-21 DIAGNOSIS — N920 Excessive and frequent menstruation with regular cycle: Secondary | ICD-10-CM | POA: Diagnosis not present

## 2015-10-23 ENCOUNTER — Ambulatory Visit (INDEPENDENT_AMBULATORY_CARE_PROVIDER_SITE_OTHER): Payer: Medicare Other | Admitting: Family Medicine

## 2015-10-23 ENCOUNTER — Encounter: Payer: Self-pay | Admitting: Family Medicine

## 2015-10-23 ENCOUNTER — Other Ambulatory Visit: Payer: Self-pay

## 2015-10-23 VITALS — BP 99/69 | HR 78 | Temp 97.9°F | Wt 253.0 lb

## 2015-10-23 DIAGNOSIS — N390 Urinary tract infection, site not specified: Secondary | ICD-10-CM

## 2015-10-23 DIAGNOSIS — R399 Unspecified symptoms and signs involving the genitourinary system: Secondary | ICD-10-CM | POA: Diagnosis not present

## 2015-10-23 MED ORDER — CIPROFLOXACIN HCL 250 MG PO TABS: 250.0000 mg | ORAL_TABLET | Freq: Two times a day (BID) | ORAL | 0 refills | Status: DC

## 2015-10-23 NOTE — Progress Notes (Signed)
BP 99/69   Pulse 78   Temp 97.9 F (36.6 C)   Wt 253 lb (114.8 kg)   LMP  (LMP Unknown) Comment: patient going thru chemo  SpO2 100%   BMI 40.22 kg/m    Subjective:    Patient ID: Christy Cox, female    DOB: 1975/05/22, 40 y.o.   MRN: [id number]  HPI: Christy Cox is a 40 y.o. female  Chief Complaint  Patient presents with  . Urinary Tract Infection    since Monday.  Pelvic cramps, frequency, burning. She has bladder botox done every 6 months.   Patient presents with 5 day history of urinary frequency, dysuria, and pelvic cramping. Regularly followed by Urology for chronic bladder issues, and frequently gets UTIs. 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 Came off last week right before symptoms began. Denies fever, chills, back pain, N/V. Has not tried taking anything.   Relevant past medical, surgical, family and social history reviewed and updated as indicated. Interim medical history since our last visit reviewed. Allergies and medications reviewed and updated.  Review of Systems  Constitutional: Negative.   HENT: Negative.   Respiratory: Negative.   Cardiovascular: Negative.   Gastrointestinal: Positive for abdominal pain.  Genitourinary: Positive for dysuria and frequency.  Musculoskeletal: Negative.   Skin: Negative.   Neurological: Negative.   Psychiatric/Behavioral: Negative.     Per HPI unless specifically indicated above     Objective:    BP 99/69   Pulse 78   Temp 97.9 F (36.6 C)   Wt 253 lb (114.8 kg)   LMP  (LMP Unknown) Comment: patient going thru chemo  SpO2 100%   BMI 40.22 kg/m   Wt Readings from Last 3 Encounters:  10/23/15 253 lb (114.8 kg)  03/18/15 262 lb (118.8 kg)  09/30/14 249 lb 3.2 oz (113 kg)    Physical Exam  Results for orders placed or performed in visit on 03/18/15  Microscopic Examination  Result Value Ref Range   WBC, UA 0-5 0 - 5 /hpf   RBC, UA 0-2  0 - 2 /hpf   Epithelial Cells (non renal) 0-10 0 - 10 /hpf   Mucus, UA Present Not Estab.   Bacteria, UA Moderate (A) None seen/Few  UA/M w/rflx Culture, Routine  Result Value Ref Range   Specific Gravity, UA 1.015 1.005 - 1.030   pH, UA 6.0 5.0 - 7.5   Color, UA Yellow Yellow   Appearance Ur Cloudy (A) Clear   Leukocytes, UA 1+ (A) Negative   Protein, UA Negative Negative/Trace   Glucose, UA Negative Negative   Ketones, UA Negative Negative   RBC, UA Trace (A) Negative   Bilirubin, UA Negative Negative   Urobilinogen, Ur 0.2 0.2 - 1.0 mg/dL   Nitrite, UA Negative Negative   Microscopic Examination See below:    Urinalysis Reflex Comment   CBC With Differential/Platelet  Result Value Ref Range   WBC 4.9 3.4 - 10.8 x10E3/uL   RBC 3.78 3.77 - 5.28 x10E6/uL   Hemoglobin 11.4 11.1 - 15.9 g/dL   Hematocrit 34.8 34.0 - 46.6 %   MCV 92 79 - 97 fL   MCH 30.2 26.6 - 33.0 pg   MCHC 32.8 31.5 - 35.7 g/dL   RDW 14.7 12.3 - 15.4 %   Platelets 140 (L) 150 - 379 x10E3/uL   Neutrophils 68 %   Lymphs 24 %   MID 9 %  Neutrophils Absolute 3.3 1.4 - 7.0 x10E3/uL   Lymphocytes Absolute 1.2 0.7 - 3.1 x10E3/uL   MID (Absolute) 0.4 0.1 - 1.6 X10E3/uL  Amylase  Result Value Ref Range   Amylase 30 (L) 31 - 124 U/L  Lipase  Result Value Ref Range   Lipase 22 0 - 59 U/L  Comprehensive metabolic panel  Result Value Ref Range   Glucose 78 65 - 99 mg/dL   BUN 9 6 - 20 mg/dL   Creatinine, Ser 0.64 0.57 - 1.00 mg/dL   GFR calc non Af Amer 113 >59 mL/min/1.73   GFR calc Af Amer 130 >59 mL/min/1.73   BUN/Creatinine Ratio 14 8 - 20   Sodium 140 134 - 144 mmol/L   Potassium 4.1 3.5 - 5.2 mmol/L   Chloride 102 96 - 106 mmol/L   CO2 26 18 - 29 mmol/L   Calcium 8.9 8.7 - 10.2 mg/dL   Total Protein 6.5 6.0 - 8.5 g/dL   Albumin 4.0 3.5 - 5.5 g/dL   Globulin, Total 2.5 1.5 - 4.5 g/dL   Albumin/Globulin Ratio 1.6 1.1 - 2.5   Bilirubin Total 0.7 0.0 - 1.2 mg/dL   Alkaline Phosphatase 61 39 - 117  IU/L   AST 17 0 - 40 IU/L   ALT 14 0 - 32 IU/L  Urine Culture, Routine  Result Value Ref Range   Urine Culture, Routine Final report (A)    Urine Culture result 1 Escherichia coli (A)    ANTIMICROBIAL SUSCEPTIBILITY Comment       Assessment & Plan:   Problem List Items Addressed This Visit    None    Visit Diagnoses    UTI (lower urinary tract infection)    -  Primary   U/A positive for leuks and nitrites. Cipro sent, push fluids. Follow up in 4-5 days if no improvement for repeat U/A.    Relevant Medications   fluconazole (DIFLUCAN) 200 MG tablet   acyclovir (ZOVIRAX) 400 MG tablet   Other Relevant Orders   UA/M w/rflx Culture, Routine (STAT)    Await cx and sensitivities - will adjust abx if needed  Follow up plan: Return if symptoms worsen or fail to improve.

## 2015-10-23 NOTE — Patient Instructions (Signed)
Follow up as needed

## 2015-10-26 DIAGNOSIS — Z7689 Persons encountering health services in other specified circumstances: Secondary | ICD-10-CM | POA: Diagnosis not present

## 2015-10-26 DIAGNOSIS — Z09 Encounter for follow-up examination after completed treatment for conditions other than malignant neoplasm: Secondary | ICD-10-CM | POA: Diagnosis not present

## 2015-10-26 DIAGNOSIS — N912 Amenorrhea, unspecified: Secondary | ICD-10-CM | POA: Diagnosis not present

## 2015-10-26 DIAGNOSIS — Z9484 Stem cells transplant status: Secondary | ICD-10-CM | POA: Diagnosis not present

## 2015-10-26 DIAGNOSIS — Z792 Long term (current) use of antibiotics: Secondary | ICD-10-CM | POA: Diagnosis not present

## 2015-10-27 DIAGNOSIS — G35 Multiple sclerosis: Secondary | ICD-10-CM | POA: Diagnosis not present

## 2015-10-27 DIAGNOSIS — R262 Difficulty in walking, not elsewhere classified: Secondary | ICD-10-CM | POA: Diagnosis not present

## 2015-10-27 DIAGNOSIS — M6281 Muscle weakness (generalized): Secondary | ICD-10-CM | POA: Diagnosis not present

## 2015-10-28 LAB — MICROSCOPIC EXAMINATION: Epithelial Cells (non renal): NONE SEEN /hpf (ref 0–10)

## 2015-10-28 LAB — UA/M W/RFLX CULTURE, ROUTINE
Bilirubin, UA: NEGATIVE
Glucose, UA: NEGATIVE
Ketones, UA: NEGATIVE
Nitrite, UA: POSITIVE — AB
Protein, UA: NEGATIVE
RBC, UA: NEGATIVE
Specific Gravity, UA: 1.01 (ref 1.005–1.030)
Urobilinogen, Ur: 0.2 mg/dL (ref 0.2–1.0)
pH, UA: 7 (ref 5.0–7.5)

## 2015-10-28 LAB — URINE CULTURE, REFLEX

## 2015-10-29 ENCOUNTER — Encounter: Payer: Self-pay | Admitting: Family Medicine

## 2015-10-29 DIAGNOSIS — G35 Multiple sclerosis: Secondary | ICD-10-CM | POA: Diagnosis not present

## 2015-10-29 DIAGNOSIS — R339 Retention of urine, unspecified: Secondary | ICD-10-CM | POA: Diagnosis not present

## 2015-10-29 DIAGNOSIS — M6281 Muscle weakness (generalized): Secondary | ICD-10-CM | POA: Diagnosis not present

## 2015-10-29 DIAGNOSIS — R262 Difficulty in walking, not elsewhere classified: Secondary | ICD-10-CM | POA: Diagnosis not present

## 2015-11-06 DIAGNOSIS — R262 Difficulty in walking, not elsewhere classified: Secondary | ICD-10-CM | POA: Diagnosis not present

## 2015-11-06 DIAGNOSIS — G35 Multiple sclerosis: Secondary | ICD-10-CM | POA: Diagnosis not present

## 2015-11-06 DIAGNOSIS — M6281 Muscle weakness (generalized): Secondary | ICD-10-CM | POA: Diagnosis not present

## 2015-11-11 DIAGNOSIS — M6281 Muscle weakness (generalized): Secondary | ICD-10-CM | POA: Diagnosis not present

## 2015-11-11 DIAGNOSIS — G35 Multiple sclerosis: Secondary | ICD-10-CM | POA: Diagnosis not present

## 2015-11-11 DIAGNOSIS — R262 Difficulty in walking, not elsewhere classified: Secondary | ICD-10-CM | POA: Diagnosis not present

## 2015-11-13 ENCOUNTER — Ambulatory Visit (INDEPENDENT_AMBULATORY_CARE_PROVIDER_SITE_OTHER): Payer: Medicare Other | Admitting: Family Medicine

## 2015-11-13 ENCOUNTER — Encounter: Payer: Self-pay | Admitting: Family Medicine

## 2015-11-13 VITALS — BP 105/71 | HR 71 | Temp 98.3°F | Ht 67.0 in | Wt 263.0 lb

## 2015-11-13 DIAGNOSIS — G35 Multiple sclerosis: Secondary | ICD-10-CM | POA: Diagnosis not present

## 2015-11-13 DIAGNOSIS — R600 Localized edema: Secondary | ICD-10-CM | POA: Diagnosis not present

## 2015-11-13 DIAGNOSIS — R262 Difficulty in walking, not elsewhere classified: Secondary | ICD-10-CM | POA: Diagnosis not present

## 2015-11-13 DIAGNOSIS — M6281 Muscle weakness (generalized): Secondary | ICD-10-CM | POA: Diagnosis not present

## 2015-11-13 MED ORDER — HYDROCHLOROTHIAZIDE 12.5 MG PO CAPS: 12.5000 mg | ORAL_CAPSULE | Freq: Every day | ORAL | 0 refills | Status: DC

## 2015-11-13 NOTE — Progress Notes (Signed)
BP 105/71 (BP Location: Right Arm, Patient Position: Sitting, Cuff Size: Normal)   Pulse 71   Temp 98.3 F (36.8 C)   Ht 5\' 7"  (1.702 m)   Wt 263 lb (119.3 kg)   LMP  (LMP Unknown) Comment: patient going thru chemo  SpO2 100%   BMI 41.19 kg/m    Subjective:    Patient ID: Christy Cox, female    DOB: 05-24-1975, 40 y.o.   MRN: [id number]  HPI: Christy Cox is a 40 y.o. female  Chief Complaint  Patient presents with  . Edema    Ankles and legs  . Weight Gain    12 pounds x's 4 days  . Stem Cell Transplant    Had stem cell transplant 100 days ago. Symptoms started after this. Had transplant in [place]. Called doctor that did transplant and he suggested to follow up with PCP.    Patient presents with b/l LE edema progressively worsening over the past 4-5 days. States she has gained 10-12 lb over this time. No new medications. Hx of stem cell transplant 100 days ago but is now off all of those medications. Denies CP, palpitations, SOB, orthopnea, calf pain or redness.   Relevant past medical, surgical, family and social history reviewed and updated as indicated. Interim medical history since our last visit reviewed. Allergies and medications reviewed and updated.  Review of Systems  Constitutional: Negative.   HENT: Negative.   Eyes: Negative.   Respiratory: Negative.   Cardiovascular: Positive for leg swelling.  Gastrointestinal: Negative.   Genitourinary: Negative.   Musculoskeletal: Negative.   Neurological: Negative.   Psychiatric/Behavioral: Negative.     Per HPI unless specifically indicated above     Objective:    BP 105/71 (BP Location: Right Arm, Patient Position: Sitting, Cuff Size: Normal)   Pulse 71   Temp 98.3 F (36.8 C)   Ht 5\' 7"  (1.702 m)   Wt 263 lb (119.3 kg)   LMP  (LMP Unknown) Comment: patient going thru chemo  SpO2 100%   BMI 41.19 kg/m   Wt Readings from Last 3 Encounters:  11/13/15 263 lb (119.3 kg)  10/23/15 253  lb (114.8 kg)  03/18/15 262 lb (118.8 kg)    Physical Exam  Constitutional: She is oriented to person, place, and time. She appears well-developed and well-nourished.  HENT:  Head: Atraumatic.  Eyes: Conjunctivae are normal. No scleral icterus.  Neck: Normal range of motion. Neck supple.  Cardiovascular: Normal rate and normal heart sounds.   Pulmonary/Chest: Effort normal. No respiratory distress.  Musculoskeletal: Normal range of motion. She exhibits edema (2+ edema in b/l lower legs, minmal in feet).  Neurological: She is alert and oriented to person, place, and time.  Skin: Skin is warm and dry.  Psychiatric: She has a normal mood and affect. Her behavior is normal.  Nursing note and vitals reviewed.     Assessment & Plan:   Problem List Items Addressed This Visit    None    Visit Diagnoses    Bilateral leg edema    -  Primary   Cautious use of 12.5 mg HCTZ given baseline BPs run very low. Compression stockings, rest, elevation of legs. Follow up Monday for recheck    Discussed that if she becomes SOB, has CP, or calf pain, to go straight to the ER.  She knows to stop taking the HCTZ if she becomes dizzy or faint.    Follow up plan: Return in about  5 days (around 11/18/2015) for Recheck.

## 2015-11-13 NOTE — Patient Instructions (Signed)
Follow up as scheduled. Rest, elevation, compression stockings.

## 2015-11-16 ENCOUNTER — Encounter: Payer: Self-pay | Admitting: Family Medicine

## 2015-11-16 ENCOUNTER — Ambulatory Visit (INDEPENDENT_AMBULATORY_CARE_PROVIDER_SITE_OTHER): Payer: Medicare Other | Admitting: Family Medicine

## 2015-11-16 VITALS — BP 90/64 | HR 90 | Temp 98.5°F | Wt 260.0 lb

## 2015-11-16 DIAGNOSIS — R635 Abnormal weight gain: Secondary | ICD-10-CM

## 2015-11-16 DIAGNOSIS — R600 Localized edema: Secondary | ICD-10-CM

## 2015-11-16 MED ORDER — LORCASERIN HCL 10 MG PO TABS: 10.0000 mg | ORAL_TABLET | Freq: Every day | ORAL | 1 refills | Status: DC

## 2015-11-16 NOTE — Patient Instructions (Signed)
Follow up as needed

## 2015-11-16 NOTE — Progress Notes (Signed)
BP 90/64   Pulse 90   Temp 98.5 F (36.9 C)   Wt 260 lb (117.9 kg)   LMP  (LMP Unknown) Comment: patient going thru chemo  SpO2 98%   BMI 40.72 kg/m    Subjective:    Patient ID: Christy Cox, female    DOB: 09-04-1975, 40 y.o.   MRN: [id number]  HPI: Christy Cox is a 40 y.o. female  Chief Complaint  Patient presents with  . Leg Swelling    not much change since last week   Patient presents for follow up b/l LE edema. Doing fairly well with HCTZ, having some night incontinence due to the medication in combo with her bladder issues from the MS. States it is helping the swelling some though. No dizziness, syncope, or other symptoms noted on it. Was unable to get compression stockings last week as she needs a script to get them covered through insurance. Last labs through stem cell group were 2 weeks ago and blood counts, renal function, liver function were all normal. No CP, SOB, orthopnea.   Also wanting to discuss restarting something for weight loss as she has not yet been cleared for physical activity and has gained a significant amount of weight during these treatments. Was given belviq months ago but couldn't afford it with her insurance. Would like to try again with her new insurance.   Relevant past medical, surgical, family and social history reviewed and updated as indicated. Interim medical history since our last visit reviewed. Allergies and medications reviewed and updated.  Review of Systems  Constitutional: Negative.   HENT: Negative.   Eyes: Negative.   Respiratory: Negative.   Cardiovascular: Positive for leg swelling.  Gastrointestinal: Negative.   Musculoskeletal: Negative.   Skin: Negative.   Neurological: Negative.   Psychiatric/Behavioral: Negative.     Per HPI unless specifically indicated above     Objective:    BP 90/64   Pulse 90   Temp 98.5 F (36.9 C)   Wt 260 lb (117.9 kg)   LMP  (LMP Unknown) Comment: patient going  thru chemo  SpO2 98%   BMI 40.72 kg/m   Wt Readings from Last 3 Encounters:  11/16/15 260 lb (117.9 kg)  11/13/15 263 lb (119.3 kg)  10/23/15 253 lb (114.8 kg)    Physical Exam  Constitutional: She is oriented to person, place, and time. She appears well-developed and well-nourished. No distress.  HENT:  Head: Atraumatic.  Eyes: Conjunctivae are normal. No scleral icterus.  Neck: Normal range of motion. Neck supple.  Cardiovascular: Normal rate and normal heart sounds.   Pulmonary/Chest: Effort normal and breath sounds normal. No respiratory distress.  Musculoskeletal: Normal range of motion. She exhibits edema (B/l LE 2+ pitting edema).  Neurological: She is alert and oriented to person, place, and time.  Skin: Skin is warm and dry.  Psychiatric: She has a normal mood and affect. Her behavior is normal.  Nursing note and vitals reviewed.     Assessment & Plan:   Problem List Items Addressed This Visit    None    Visit Diagnoses    Bilateral leg edema    -  Primary   Continue HCTZ, get compression stockings (paper script given), elevation. Await CBC and CMP results.    Relevant Orders   CBC with Differential/Platelet   Comprehensive metabolic panel   Weight gain       Will resend Belviq to see if better covered. Discussed getting back  into working out once cleared by stem cell team       Follow up plan: Return in about 2 weeks (around 11/30/2015).

## 2015-11-17 ENCOUNTER — Encounter: Payer: Self-pay | Admitting: Family Medicine

## 2015-11-17 LAB — CBC WITH DIFFERENTIAL/PLATELET
Basophils Absolute: 0 10*3/uL (ref 0.0–0.2)
Basos: 0 %
EOS (ABSOLUTE): 0.1 10*3/uL (ref 0.0–0.4)
Eos: 2 %
Hematocrit: 32.5 % — ABNORMAL LOW (ref 34.0–46.6)
Hemoglobin: 11 g/dL — ABNORMAL LOW (ref 11.1–15.9)
Immature Grans (Abs): 0 10*3/uL (ref 0.0–0.1)
Immature Granulocytes: 0 %
Lymphocytes Absolute: 1.5 10*3/uL (ref 0.7–3.1)
Lymphs: 40 %
MCH: 31.3 pg (ref 26.6–33.0)
MCHC: 33.8 g/dL (ref 31.5–35.7)
MCV: 92 fL (ref 79–97)
Monocytes Absolute: 0.5 10*3/uL (ref 0.1–0.9)
Monocytes: 12 %
Neutrophils Absolute: 1.8 10*3/uL (ref 1.4–7.0)
Neutrophils: 46 %
Platelets: 135 10*3/uL — ABNORMAL LOW (ref 150–379)
RBC: 3.52 x10E6/uL — ABNORMAL LOW (ref 3.77–5.28)
RDW: 15.4 % (ref 12.3–15.4)
WBC: 3.9 10*3/uL (ref 3.4–10.8)

## 2015-11-17 LAB — COMPREHENSIVE METABOLIC PANEL
ALT: 15 IU/L (ref 0–32)
AST: 17 IU/L (ref 0–40)
Albumin/Globulin Ratio: 1.8 (ref 1.2–2.2)
Albumin: 4 g/dL (ref 3.5–5.5)
Alkaline Phosphatase: 59 IU/L (ref 39–117)
BUN/Creatinine Ratio: 14 (ref 9–23)
BUN: 9 mg/dL (ref 6–24)
Bilirubin Total: 0.4 mg/dL (ref 0.0–1.2)
CO2: 27 mmol/L (ref 18–29)
Calcium: 9.1 mg/dL (ref 8.7–10.2)
Chloride: 100 mmol/L (ref 96–106)
Creatinine, Ser: 0.65 mg/dL (ref 0.57–1.00)
GFR calc Af Amer: 128 mL/min/{1.73_m2} (ref >59)
GFR calc non Af Amer: 111 mL/min/{1.73_m2} (ref >59)
Globulin, Total: 2.2 g/dL (ref 1.5–4.5)
Glucose: 82 mg/dL (ref 65–99)
Potassium: 4.1 mmol/L (ref 3.5–5.2)
Sodium: 141 mmol/L (ref 134–144)
Total Protein: 6.2 g/dL (ref 6.0–8.5)

## 2015-11-20 DIAGNOSIS — M6281 Muscle weakness (generalized): Secondary | ICD-10-CM | POA: Diagnosis not present

## 2015-11-20 DIAGNOSIS — R262 Difficulty in walking, not elsewhere classified: Secondary | ICD-10-CM | POA: Diagnosis not present

## 2015-11-20 DIAGNOSIS — G35 Multiple sclerosis: Secondary | ICD-10-CM | POA: Diagnosis not present

## 2015-11-24 DIAGNOSIS — R262 Difficulty in walking, not elsewhere classified: Secondary | ICD-10-CM | POA: Diagnosis not present

## 2015-11-24 DIAGNOSIS — G35 Multiple sclerosis: Secondary | ICD-10-CM | POA: Diagnosis not present

## 2015-11-24 DIAGNOSIS — M6281 Muscle weakness (generalized): Secondary | ICD-10-CM | POA: Diagnosis not present

## 2015-11-26 ENCOUNTER — Ambulatory Visit (INDEPENDENT_AMBULATORY_CARE_PROVIDER_SITE_OTHER): Payer: Medicare Other

## 2015-11-26 ENCOUNTER — Ambulatory Visit (INDEPENDENT_AMBULATORY_CARE_PROVIDER_SITE_OTHER): Payer: Medicare Other | Admitting: Podiatry

## 2015-11-26 ENCOUNTER — Encounter: Payer: Self-pay | Admitting: Podiatry

## 2015-11-26 VITALS — BP 102/70 | HR 84 | Resp 18

## 2015-11-26 DIAGNOSIS — S92302A Fracture of unspecified metatarsal bone(s), left foot, initial encounter for closed fracture: Secondary | ICD-10-CM

## 2015-11-26 DIAGNOSIS — R52 Pain, unspecified: Secondary | ICD-10-CM

## 2015-11-26 NOTE — Progress Notes (Signed)
   Subjective:    Patient ID: Christy Cox, female    DOB: 03/05/1975, 40 y.o.   MRN: [id number]  HPI  40 year old female presents the also concerns of pain to her left foot. She states that she was in the hospital for stem cell transplant for several days and upon returning home she has been weakened on physical therapy. However due to her weakness she has fallen on 4 occasions. She did injure her foot about 2 weeks ago and since that she's had swelling to her foot. She went to physical therapy yesterday and directed her to follow-up for concern for possible fracture the top of her left foot. She states the swelling gets worse. They. No treatment previously. She states that she cannot wear shoes due to the swelling.   Review of Systems  All other systems reviewed and are negative.      Objective:   Physical Exam General: AAO x3, NAD  Dermatological: Skin is warm, dry and supple bilateral. Nails x 10 are well manicured; remaining integument appears unremarkable at this time. There are no open sores, no preulcerative lesions, no rash or signs of infection present.  Vascular: Dorsalis Pedis artery and Posterior Tibial artery pedal pulses are 2/4 bilateral with immedate capillary fill time. There is no pain with calf compression, swelling, warmth, erythema.   Neruologic: Grossly intact via light touch bilateral. Vibratory intact via tuning fork bilateral. Protective threshold with Semmes Wienstein monofilament intact to all pedal sites bilateral.    Musculoskeletal: There is tenderness at the fifth metatarsal base a left foot into the lateral aspect of the foot along the Lisfranc joint. There is edema to the left foot without any erythema or increase in warmth. There is no other areas of tenderness. Ankle, subtalar, midtarsal range of motion intact. No pedal on the course the peroneal tendons. No other areas of tenderness bilaterally. MMT 5/5. Range of motion intact.  Gait: Unassisted,  Nonantalgic.      Assessment & Plan:  40 year old female likely left foot fifth metatarsal base fracture -Treatment options discussed including all alternatives, risks, and complications -Etiology of symptoms were discussed -X-rays were obtained and reviewed with the patient. On the oblique view is a radiolucent line consistent with a possible fracture. She also has clinical symptoms of pain to the area directly upon palpation. -Recommend mobilization a cam boot. This was dispensed today. -Ice and elevation -Declined pain medication -Follow-up in 3 weeks. Call if questions or concerns meantime. X-ray left foot next appointment.  Celesta Gentile, DPM

## 2015-11-27 DIAGNOSIS — R262 Difficulty in walking, not elsewhere classified: Secondary | ICD-10-CM | POA: Diagnosis not present

## 2015-11-27 DIAGNOSIS — M6281 Muscle weakness (generalized): Secondary | ICD-10-CM | POA: Diagnosis not present

## 2015-11-27 DIAGNOSIS — G35 Multiple sclerosis: Secondary | ICD-10-CM | POA: Diagnosis not present

## 2015-11-30 ENCOUNTER — Ambulatory Visit: Payer: Medicare Other | Admitting: Family Medicine

## 2015-12-01 DIAGNOSIS — G35 Multiple sclerosis: Secondary | ICD-10-CM | POA: Diagnosis not present

## 2015-12-01 DIAGNOSIS — M6281 Muscle weakness (generalized): Secondary | ICD-10-CM | POA: Diagnosis not present

## 2015-12-01 DIAGNOSIS — R262 Difficulty in walking, not elsewhere classified: Secondary | ICD-10-CM | POA: Diagnosis not present

## 2015-12-03 ENCOUNTER — Ambulatory Visit (INDEPENDENT_AMBULATORY_CARE_PROVIDER_SITE_OTHER): Payer: Medicare Other | Admitting: Family Medicine

## 2015-12-03 ENCOUNTER — Encounter: Payer: Self-pay | Admitting: Family Medicine

## 2015-12-03 VITALS — BP 94/67 | HR 84 | Temp 98.2°F | Wt 258.0 lb

## 2015-12-03 DIAGNOSIS — R600 Localized edema: Secondary | ICD-10-CM

## 2015-12-03 DIAGNOSIS — R339 Retention of urine, unspecified: Secondary | ICD-10-CM | POA: Diagnosis not present

## 2015-12-03 MED ORDER — HYDROCHLOROTHIAZIDE 12.5 MG PO CAPS: 12.5000 mg | ORAL_CAPSULE | Freq: Every day | ORAL | 1 refills | Status: DC

## 2015-12-03 NOTE — Patient Instructions (Signed)
Follow up as needed

## 2015-12-03 NOTE — Progress Notes (Signed)
BP 94/67   Pulse 84   Temp 98.2 F (36.8 C)   Wt 258 lb (117 kg)   SpO2 98%   BMI 40.41 kg/m    Subjective:    Patient ID: Christy Cox, female    DOB: March 24, 1975, 40 y.o.   MRN: [id number]  HPI: Christy Cox is a 40 y.o. female  Chief Complaint  Patient presents with  . Edema    follow up leg edema, it's finally improving   Patient presents for f/u b/l LE edema. Still doing HCTZ daily and not having any issues with it. Edema has greatly improved. Tried compression stockings but did not like they way it felt so has not been using them. Has found out that she had a fractured metatarsal in left foot since she was last here, wearing a boot for a few weeks for that per orthopedics. Denies dizziness, lightheadedness, or syncope.   Relevant past medical, surgical, family and social history reviewed and updated as indicated. Interim medical history since our last visit reviewed. Allergies and medications reviewed and updated.  Review of Systems  Constitutional: Negative.   HENT: Negative.   Respiratory: Negative.   Cardiovascular: Positive for leg swelling.  Gastrointestinal: Negative.   Genitourinary: Negative.   Musculoskeletal: Negative.   Skin: Negative.   Neurological: Negative.   Psychiatric/Behavioral: Negative.     Per HPI unless specifically indicated above     Objective:    BP 94/67   Pulse 84   Temp 98.2 F (36.8 C)   Wt 258 lb (117 kg)   SpO2 98%   BMI 40.41 kg/m   Wt Readings from Last 3 Encounters:  12/03/15 258 lb (117 kg)  11/16/15 260 lb (117.9 kg)  11/13/15 263 lb (119.3 kg)    Physical Exam  Constitutional: She is oriented to person, place, and time. She appears well-developed and well-nourished. No distress.  HENT:  Head: Atraumatic.  Eyes: Conjunctivae are normal. No scleral icterus.  Neck: Normal range of motion. Neck supple.  Cardiovascular: Normal rate, regular rhythm and normal heart sounds.   Pulmonary/Chest:  Effort normal and breath sounds normal. No respiratory distress.  Musculoskeletal: Normal range of motion. She exhibits edema (Trace edema in b/l LEs). She exhibits no tenderness.  Neurological: She is alert and oriented to person, place, and time.  Skin: Skin is warm and dry.  Psychiatric: She has a normal mood and affect. Her behavior is normal.    Results for orders placed or performed in visit on 11/16/15  CBC with Differential/Platelet  Result Value Ref Range   WBC 3.9 3.4 - 10.8 x10E3/uL   RBC 3.52 (L) 3.77 - 5.28 x10E6/uL   Hemoglobin 11.0 (L) 11.1 - 15.9 g/dL   Hematocrit 32.5 (L) 34.0 - 46.6 %   MCV 92 79 - 97 fL   MCH 31.3 26.6 - 33.0 pg   MCHC 33.8 31.5 - 35.7 g/dL   RDW 15.4 12.3 - 15.4 %   Platelets 135 (L) 150 - 379 x10E3/uL   Neutrophils 46 %   Lymphs 40 %   Monocytes 12 %   Eos 2 %   Basos 0 %   Neutrophils Absolute 1.8 1.4 - 7.0 x10E3/uL   Lymphocytes Absolute 1.5 0.7 - 3.1 x10E3/uL   Monocytes Absolute 0.5 0.1 - 0.9 x10E3/uL   EOS (ABSOLUTE) 0.1 0.0 - 0.4 x10E3/uL   Basophils Absolute 0.0 0.0 - 0.2 x10E3/uL   Immature Granulocytes 0 %   Immature Grans (Abs)  0.0 0.0 - 0.1 x10E3/uL  Comprehensive metabolic panel  Result Value Ref Range   Glucose 82 65 - 99 mg/dL   BUN 9 6 - 24 mg/dL   Creatinine, Ser 0.65 0.57 - 1.00 mg/dL   GFR calc non Af Amer 111 >59 mL/min/1.73   GFR calc Af Amer 128 >59 mL/min/1.73   BUN/Creatinine Ratio 14 9 - 23   Sodium 141 134 - 144 mmol/L   Potassium 4.1 3.5 - 5.2 mmol/L   Chloride 100 96 - 106 mmol/L   CO2 27 18 - 29 mmol/L   Calcium 9.1 8.7 - 10.2 mg/dL   Total Protein 6.2 6.0 - 8.5 g/dL   Albumin 4.0 3.5 - 5.5 g/dL   Globulin, Total 2.2 1.5 - 4.5 g/dL   Albumin/Globulin Ratio 1.8 1.2 - 2.2   Bilirubin Total 0.4 0.0 - 1.2 mg/dL   Alkaline Phosphatase 59 39 - 117 IU/L   AST 17 0 - 40 IU/L   ALT 15 0 - 32 IU/L      Assessment & Plan:   Problem List Items Addressed This Visit    None    Visit Diagnoses    Bilateral  leg edema    -  Primary   Much improved. Continue HCTZ unless becoming dizzy or lightheaded. Continue leg elevation.        Follow up plan: Return if symptoms worsen or fail to improve.

## 2015-12-07 ENCOUNTER — Ambulatory Visit (INDEPENDENT_AMBULATORY_CARE_PROVIDER_SITE_OTHER): Payer: Medicare Other | Admitting: Family Medicine

## 2015-12-07 ENCOUNTER — Encounter: Payer: Self-pay | Admitting: Family Medicine

## 2015-12-07 VITALS — BP 106/75 | HR 92 | Temp 98.8°F | Wt 257.0 lb

## 2015-12-07 DIAGNOSIS — B9789 Other viral agents as the cause of diseases classified elsewhere: Secondary | ICD-10-CM

## 2015-12-07 DIAGNOSIS — J069 Acute upper respiratory infection, unspecified: Secondary | ICD-10-CM | POA: Diagnosis not present

## 2015-12-07 MED ORDER — DM-GUAIFENESIN ER 30-600 MG PO TB12: 1.0000 | ORAL_TABLET | Freq: Two times a day (BID) | ORAL | 0 refills | Status: DC

## 2015-12-07 MED ORDER — HYDROCOD POLST-CPM POLST ER 10-8 MG/5ML PO SUER: 5.0000 mL | Freq: Two times a day (BID) | ORAL | 0 refills | Status: DC | PRN

## 2015-12-07 MED ORDER — BENZONATATE 200 MG PO CAPS: 200.0000 mg | ORAL_CAPSULE | Freq: Two times a day (BID) | ORAL | 0 refills | Status: DC | PRN

## 2015-12-07 NOTE — Patient Instructions (Signed)
Follow up as needed

## 2015-12-07 NOTE — Progress Notes (Signed)
BP 106/75   Pulse 92   Temp 98.8 F (37.1 C)   Wt 257 lb (116.6 kg)   SpO2 98%   BMI 40.25 kg/m    Subjective:    Patient ID: Christy Cox, female    DOB: [birth year], 40 y.o.   MRN: [id number]  HPI: Christy Cox is a 40 y.o. female  Chief Complaint  Patient presents with  . Cough    started over the weekend,worse last night and today. Cough (non productive), sore throat, chest congestion, body aches. No ear ache, no fever. Raw places on her tongue.    Patient presents with 3 day history of hacking cough, congestion, body aches, raw spots on tongue. 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 Has not been trying anything over the counter. 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   Relevant past medical, surgical, family and social history reviewed and updated as indicated. Interim medical history since our last visit reviewed. Allergies and medications reviewed and updated.  Review of Systems  Constitutional: Positive for fever.  HENT: Positive for congestion, mouth sores, rhinorrhea and sore throat.   Respiratory: Positive for cough and wheezing.   Cardiovascular: Negative.   Gastrointestinal: Negative.   Genitourinary: Negative.   Musculoskeletal: Negative.   Skin: Negative.   Neurological: Negative.   Psychiatric/Behavioral: Negative.     Per HPI unless specifically indicated above     Objective:    BP 106/75   Pulse 92   Temp 98.8 F (37.1 C)   Wt 257 lb (116.6 kg)   SpO2 98%   BMI 40.25 kg/m   Wt Readings from Last 3 Encounters:  12/07/15 257 lb (116.6 kg)  12/03/15 258 lb (117 kg)  11/16/15 260 lb (117.9 kg)    Physical Exam  Constitutional: She is oriented to person, place, and time. She appears well-developed and well-nourished. No distress.  HENT:  Head:  Atraumatic.  Right Ear: External ear normal.  Left Ear: External ear normal.  Nose: Nose normal.  Mouth/Throat: Oropharynx is clear and moist.  Several raw spots along edge of tongue and cheek b/l  Eyes: Conjunctivae are normal. No scleral icterus.  Neck: Normal range of motion. Neck supple.  Cardiovascular: Normal rate, regular rhythm and normal heart sounds.   Pulmonary/Chest: Effort normal. No respiratory distress.  Musculoskeletal: Normal range of motion.  Lymphadenopathy:    She has no cervical adenopathy.  Neurological: She is alert and oriented to person, place, and time.  Skin: Skin is warm and dry.  Psychiatric: She has a normal mood and affect. Her behavior is normal.  Nursing note and vitals reviewed.   Results for orders placed or performed in visit on 11/16/15  CBC with Differential/Platelet  Result Value Ref Range   WBC 3.9 3.4 - 10.8 x10E3/uL   RBC 3.52 (L) 3.77 - 5.28 x10E6/uL   Hemoglobin 11.0 (L) 11.1 - 15.9 g/dL   Hematocrit 32.5 (L) 34.0 - 46.6 %   MCV 92 79 - 97 fL   MCH 31.3 26.6 - 33.0 pg   MCHC 33.8 31.5 - 35.7 g/dL   RDW 15.4 12.3 - 15.4 %   Platelets 135 (L) 150 - 379 x10E3/uL   Neutrophils 46 %   Lymphs 40 %   Monocytes 12 %   Eos 2 %  Basos 0 %   Neutrophils Absolute 1.8 1.4 - 7.0 x10E3/uL   Lymphocytes Absolute 1.5 0.7 - 3.1 x10E3/uL   Monocytes Absolute 0.5 0.1 - 0.9 x10E3/uL   EOS (ABSOLUTE) 0.1 0.0 - 0.4 x10E3/uL   Basophils Absolute 0.0 0.0 - 0.2 x10E3/uL   Immature Granulocytes 0 %   Immature Grans (Abs) 0.0 0.0 - 0.1 x10E3/uL  Comprehensive metabolic panel  Result Value Ref Range   Glucose 82 65 - 99 mg/dL   BUN 9 6 - 24 mg/dL   Creatinine, Ser 0.65 0.57 - 1.00 mg/dL   GFR calc non Af Amer 111 >59 mL/min/1.73   GFR calc Af Amer 128 >59 mL/min/1.73   BUN/Creatinine Ratio 14 9 - 23   Sodium 141 134 - 144 mmol/L   Potassium 4.1 3.5 - 5.2 mmol/L   Chloride 100 96 - 106 mmol/L   CO2 27 18 - 29 mmol/L   Calcium 9.1 8.7 - 10.2 mg/dL     Total Protein 6.2 6.0 - 8.5 g/dL   Albumin 4.0 3.5 - 5.5 g/dL   Globulin, Total 2.2 1.5 - 4.5 g/dL   Albumin/Globulin Ratio 1.8 1.2 - 2.2   Bilirubin Total 0.4 0.0 - 1.2 mg/dL   Alkaline Phosphatase 59 39 - 117 IU/L   AST 17 0 - 40 IU/L   ALT 15 0 - 32 IU/L      Assessment & Plan:   Problem List Items Addressed This Visit    None    Visit Diagnoses    Viral upper respiratory tract infection    -  Primary   Will treat symptomatically with mucinex, tessalon and tussionex, and throat sprays for sore places.     Will have low threshold for giving abx giving her immune status. Patient knows to follow up via Mychart in 2-3 days if no better or worsening.    Follow up plan: Return if symptoms worsen or fail to improve.

## 2015-12-10 ENCOUNTER — Other Ambulatory Visit: Payer: Self-pay | Admitting: Family Medicine

## 2015-12-10 ENCOUNTER — Encounter: Payer: Self-pay | Admitting: Family Medicine

## 2015-12-10 ENCOUNTER — Ambulatory Visit: Payer: Medicare Other | Admitting: Podiatry

## 2015-12-10 DIAGNOSIS — R058 Other specified cough: Secondary | ICD-10-CM

## 2015-12-10 DIAGNOSIS — R05 Cough: Secondary | ICD-10-CM

## 2016-01-06 ENCOUNTER — Encounter: Payer: Self-pay | Admitting: Family Medicine

## 2016-01-06 NOTE — Telephone Encounter (Signed)
Form completed and faxed back to patient.

## 2016-01-06 NOTE — Telephone Encounter (Signed)
Routing to provider  

## 2016-01-19 DIAGNOSIS — R339 Retention of urine, unspecified: Secondary | ICD-10-CM | POA: Diagnosis not present

## 2016-01-27 DIAGNOSIS — R2689 Other abnormalities of gait and mobility: Secondary | ICD-10-CM | POA: Diagnosis not present

## 2016-01-27 DIAGNOSIS — M6281 Muscle weakness (generalized): Secondary | ICD-10-CM | POA: Diagnosis not present

## 2016-01-27 DIAGNOSIS — G35 Multiple sclerosis: Secondary | ICD-10-CM | POA: Diagnosis not present

## 2016-02-01 DIAGNOSIS — G35 Multiple sclerosis: Secondary | ICD-10-CM | POA: Diagnosis not present

## 2016-02-01 DIAGNOSIS — M6281 Muscle weakness (generalized): Secondary | ICD-10-CM | POA: Diagnosis not present

## 2016-02-01 DIAGNOSIS — R2689 Other abnormalities of gait and mobility: Secondary | ICD-10-CM | POA: Diagnosis not present

## 2016-02-04 DIAGNOSIS — Z9484 Stem cells transplant status: Secondary | ICD-10-CM | POA: Insufficient documentation

## 2016-02-09 DIAGNOSIS — G35 Multiple sclerosis: Secondary | ICD-10-CM | POA: Diagnosis not present

## 2016-02-09 DIAGNOSIS — Z9484 Stem cells transplant status: Secondary | ICD-10-CM | POA: Diagnosis not present

## 2016-02-10 DIAGNOSIS — G35 Multiple sclerosis: Secondary | ICD-10-CM | POA: Diagnosis not present

## 2016-02-12 ENCOUNTER — Telehealth: Payer: Self-pay | Admitting: *Deleted

## 2016-02-12 NOTE — Telephone Encounter (Signed)
Pt left name, DOB and phone number. 02/12/2016-Pt's mailbox is full, unable to leave a message.

## 2016-02-17 DIAGNOSIS — G35 Multiple sclerosis: Secondary | ICD-10-CM | POA: Diagnosis not present

## 2016-02-17 DIAGNOSIS — R2689 Other abnormalities of gait and mobility: Secondary | ICD-10-CM | POA: Diagnosis not present

## 2016-02-17 DIAGNOSIS — M6281 Muscle weakness (generalized): Secondary | ICD-10-CM | POA: Diagnosis not present

## 2016-02-17 DIAGNOSIS — R339 Retention of urine, unspecified: Secondary | ICD-10-CM | POA: Diagnosis not present

## 2016-03-07 DIAGNOSIS — M6281 Muscle weakness (generalized): Secondary | ICD-10-CM | POA: Diagnosis not present

## 2016-03-07 DIAGNOSIS — R2689 Other abnormalities of gait and mobility: Secondary | ICD-10-CM | POA: Diagnosis not present

## 2016-03-07 DIAGNOSIS — G35 Multiple sclerosis: Secondary | ICD-10-CM | POA: Diagnosis not present

## 2016-04-05 DIAGNOSIS — N3281 Overactive bladder: Secondary | ICD-10-CM | POA: Diagnosis not present

## 2016-05-18 DIAGNOSIS — N390 Urinary tract infection, site not specified: Secondary | ICD-10-CM | POA: Diagnosis not present

## 2016-05-18 DIAGNOSIS — R339 Retention of urine, unspecified: Secondary | ICD-10-CM | POA: Diagnosis not present

## 2016-05-30 ENCOUNTER — Other Ambulatory Visit: Payer: Self-pay | Admitting: Family Medicine

## 2016-06-06 ENCOUNTER — Ambulatory Visit (INDEPENDENT_AMBULATORY_CARE_PROVIDER_SITE_OTHER): Payer: BLUE CROSS/BLUE SHIELD | Admitting: Family Medicine

## 2016-06-06 ENCOUNTER — Ambulatory Visit: Payer: Self-pay | Admitting: Family Medicine

## 2016-06-06 ENCOUNTER — Encounter: Payer: Self-pay | Admitting: Family Medicine

## 2016-06-06 VITALS — BP 106/75 | HR 82 | Temp 98.1°F | Wt 269.0 lb

## 2016-06-06 DIAGNOSIS — R635 Abnormal weight gain: Secondary | ICD-10-CM

## 2016-06-06 DIAGNOSIS — R600 Localized edema: Secondary | ICD-10-CM | POA: Diagnosis not present

## 2016-06-06 NOTE — Progress Notes (Signed)
BP 106/75   Pulse 82   Temp 98.1 F (36.7 C)   Wt 269 lb (122 kg)   LMP 07/30/2015 (Approximate)   SpO2 99%   BMI 42.13 kg/m    Subjective:    Patient ID: Christy Cox, female    DOB: May 02, 1975, 41 y.o.   MRN: [id number]  HPI: Christy Cox is a 41 y.o. female  Chief Complaint  Patient presents with  . Medication Refill    She is needing a refill on her HCTZ. Ankles swell at night time.    Patient presents for f/u of b/l LE edema on HCTZ. BPs have remained stable, denies dizziness, syncope, SOB. Still having some edema b/l, worst at night. Works sitting down in a chair all day, not currently wearing compression hose. Props feet up at night.   Pt also frustrated with her weight gain since undergoing stem cell transplants to treat her MS. Has been unable to exercise to the extent she would normally. States her diet is really good but the weight won't budge. Wondering if it could be her thyroid.   Past Medical History:  Diagnosis Date  . Abscess, abdomen ([place])    H/o sterile abscess in right abd  . Bladder disorder, other    Dystonic bladder  . Multiple sclerosis ([place])    c/b transverse myelitis and optic neuritis   Social History   Social History  . Marital status: Married    Spouse name: dennis  . Number of children: 2  . Years of education: College   Occupational History  . n/a     disabled   Social History Main Topics  . Smoking status: Never Smoker  . Smokeless tobacco: Never Used  . Alcohol use No     Comment: on occasion, socially  . Drug use: No  . Sexual activity: Yes    Birth control/ protection: Surgical   Other Topics Concern  . Not on file   Social History Narrative   Pt lives at home with her spouse.    Caffeine Use- 1-2 per week   Relevant past medical, surgical, family and social history reviewed and updated as indicated. Interim medical history since our last visit reviewed. Allergies and medications reviewed and  updated.  Review of Systems  Constitutional: Negative.   HENT: Negative.   Eyes: Negative.   Respiratory: Negative.   Cardiovascular: Positive for leg swelling.  Gastrointestinal: Negative.   Genitourinary: Negative.   Musculoskeletal: Negative.   Neurological: Negative.   Psychiatric/Behavioral: Negative.     Per HPI unless specifically indicated above     Objective:    BP 106/75   Pulse 82   Temp 98.1 F (36.7 C)   Wt 269 lb (122 kg)   LMP 07/30/2015 (Approximate)   SpO2 99%   BMI 42.13 kg/m   Wt Readings from Last 3 Encounters:  06/06/16 269 lb (122 kg)  12/07/15 257 lb (116.6 kg)  12/03/15 258 lb (117 kg)    Physical Exam  Constitutional: She is oriented to person, place, and time. She appears well-developed and well-nourished. No distress.  HENT:  Head: Atraumatic.  Eyes: Conjunctivae are normal. Pupils are equal, round, and reactive to light.  Neck: Normal range of motion. Neck supple.  Cardiovascular: Normal rate, normal heart sounds and intact distal pulses.   Pulmonary/Chest: Effort normal and breath sounds normal. No respiratory distress.  Musculoskeletal: Normal range of motion.  Neurological: She is alert and oriented to person, place, and  time.  Skin: Skin is warm and dry.  Psychiatric: She has a normal mood and affect. Her behavior is normal.  Nursing note and vitals reviewed.     Assessment & Plan:   Problem List Items Addressed This Visit    None    Visit Diagnoses    Leg edema    -  Primary   Continue HCTZ, start compression hose, elevate legs at work, increase exercise as tolerated. [place]ine to monitor for orthostatic sxs. Await CMP results   Relevant Orders   Comprehensive metabolic panel (Completed)   Weight gain       Long discussion about lifestyle modifications. Referral placed to lifestyle center. Await TSH results   Relevant Orders   TSH (Completed)   Amb Referral to Nutrition [facility] Diabetic [facility]       Follow up plan: Return in  about 6 months (around 12/06/2016) for HTN.

## 2016-06-07 ENCOUNTER — Ambulatory Visit: Payer: Medicare Other | Admitting: Family Medicine

## 2016-06-07 LAB — COMPREHENSIVE METABOLIC PANEL
ALT: 12 IU/L (ref 0–32)
AST: 18 IU/L (ref 0–40)
Albumin/Globulin Ratio: 1.8 (ref 1.2–2.2)
Albumin: 4.2 g/dL (ref 3.5–5.5)
Alkaline Phosphatase: 101 IU/L (ref 39–117)
BUN/Creatinine Ratio: 14 (ref 9–23)
BUN: 11 mg/dL (ref 6–24)
Bilirubin Total: 0.4 mg/dL (ref 0.0–1.2)
CO2: 29 mmol/L (ref 18–29)
Calcium: 9 mg/dL (ref 8.7–10.2)
Chloride: 101 mmol/L (ref 96–106)
Creatinine, Ser: 0.77 mg/dL (ref 0.57–1.00)
GFR calc Af Amer: 111 mL/min/{1.73_m2} (ref >59)
GFR calc non Af Amer: 96 mL/min/{1.73_m2} (ref >59)
Globulin, Total: 2.3 g/dL (ref 1.5–4.5)
Glucose: 79 mg/dL (ref 65–99)
Potassium: 4 mmol/L (ref 3.5–5.2)
Sodium: 143 mmol/L (ref 134–144)
Total Protein: 6.5 g/dL (ref 6.0–8.5)

## 2016-06-07 LAB — TSH: TSH: 2.19 u[IU]/mL (ref 0.450–4.500)

## 2016-06-14 ENCOUNTER — Other Ambulatory Visit: Payer: Self-pay | Admitting: Family Medicine

## 2016-06-14 NOTE — Telephone Encounter (Signed)
routing to provider

## 2016-07-12 DIAGNOSIS — G35 Multiple sclerosis: Secondary | ICD-10-CM | POA: Diagnosis not present

## 2016-07-12 DIAGNOSIS — M47812 Spondylosis without myelopathy or radiculopathy, cervical region: Secondary | ICD-10-CM | POA: Diagnosis not present

## 2016-07-12 DIAGNOSIS — Z9484 Stem cells transplant status: Secondary | ICD-10-CM | POA: Diagnosis not present

## 2016-07-13 DIAGNOSIS — G35 Multiple sclerosis: Secondary | ICD-10-CM | POA: Diagnosis not present

## 2016-07-13 DIAGNOSIS — Z9484 Stem cells transplant status: Secondary | ICD-10-CM | POA: Diagnosis not present

## 2016-09-21 DIAGNOSIS — R339 Retention of urine, unspecified: Secondary | ICD-10-CM | POA: Diagnosis not present

## 2016-10-06 DIAGNOSIS — G35 Multiple sclerosis: Secondary | ICD-10-CM | POA: Diagnosis not present

## 2016-10-06 DIAGNOSIS — N3000 Acute cystitis without hematuria: Secondary | ICD-10-CM | POA: Diagnosis not present

## 2016-10-06 DIAGNOSIS — R339 Retention of urine, unspecified: Secondary | ICD-10-CM | POA: Diagnosis not present

## 2016-10-06 DIAGNOSIS — N3281 Overactive bladder: Secondary | ICD-10-CM | POA: Diagnosis not present

## 2016-10-12 DIAGNOSIS — G35 Multiple sclerosis: Secondary | ICD-10-CM | POA: Diagnosis not present

## 2016-10-12 DIAGNOSIS — R0602 Shortness of breath: Secondary | ICD-10-CM | POA: Diagnosis not present

## 2016-10-12 DIAGNOSIS — R002 Palpitations: Secondary | ICD-10-CM | POA: Diagnosis not present

## 2016-10-12 DIAGNOSIS — Z9484 Stem cells transplant status: Secondary | ICD-10-CM | POA: Diagnosis not present

## 2016-10-12 DIAGNOSIS — R0789 Other chest pain: Secondary | ICD-10-CM | POA: Diagnosis not present

## 2016-10-12 DIAGNOSIS — N390 Urinary tract infection, site not specified: Secondary | ICD-10-CM | POA: Diagnosis not present

## 2016-10-12 DIAGNOSIS — Z8249 Family history of ischemic heart disease and other diseases of the circulatory system: Secondary | ICD-10-CM | POA: Diagnosis not present

## 2016-10-12 DIAGNOSIS — R5383 Other fatigue: Secondary | ICD-10-CM | POA: Diagnosis not present

## 2016-10-12 DIAGNOSIS — M79602 Pain in left arm: Secondary | ICD-10-CM | POA: Diagnosis not present

## 2016-10-12 DIAGNOSIS — R079 Chest pain, unspecified: Secondary | ICD-10-CM | POA: Diagnosis not present

## 2016-10-19 DIAGNOSIS — N3281 Overactive bladder: Secondary | ICD-10-CM | POA: Diagnosis not present

## 2016-10-25 DIAGNOSIS — R8290 Unspecified abnormal findings in urine: Secondary | ICD-10-CM | POA: Diagnosis not present

## 2016-10-25 DIAGNOSIS — N390 Urinary tract infection, site not specified: Secondary | ICD-10-CM | POA: Diagnosis not present

## 2016-11-29 ENCOUNTER — Telehealth: Payer: Self-pay | Admitting: Family Medicine

## 2016-11-29 NOTE — Telephone Encounter (Signed)
Patient has not been seen, she will need to come in for an appointment

## 2016-11-29 NOTE — Telephone Encounter (Signed)
Patient feels she may have UTI and would like to have urine checked only. She has an appointment 10/8 with Dr Wynetta Emery.  Can she come in today for urine sample.  Please advise   Thank you  [phone]

## 2016-11-30 DIAGNOSIS — R339 Retention of urine, unspecified: Secondary | ICD-10-CM | POA: Diagnosis not present

## 2016-11-30 DIAGNOSIS — N3000 Acute cystitis without hematuria: Secondary | ICD-10-CM | POA: Diagnosis not present

## 2016-12-05 ENCOUNTER — Ambulatory Visit: Payer: Medicare Other | Admitting: Family Medicine

## 2016-12-06 ENCOUNTER — Ambulatory Visit (INDEPENDENT_AMBULATORY_CARE_PROVIDER_SITE_OTHER): Payer: BLUE CROSS/BLUE SHIELD | Admitting: Family Medicine

## 2016-12-06 ENCOUNTER — Encounter: Payer: Self-pay | Admitting: Family Medicine

## 2016-12-06 ENCOUNTER — Telehealth: Payer: Self-pay | Admitting: Family Medicine

## 2016-12-06 VITALS — BP 90/62 | HR 77 | Temp 97.8°F | Ht 67.0 in | Wt 262.1 lb

## 2016-12-06 DIAGNOSIS — R635 Abnormal weight gain: Secondary | ICD-10-CM | POA: Diagnosis not present

## 2016-12-06 DIAGNOSIS — R600 Localized edema: Secondary | ICD-10-CM

## 2016-12-06 MED ORDER — HYDROCHLOROTHIAZIDE 12.5 MG PO CAPS: 12.5000 mg | ORAL_CAPSULE | Freq: Every day | ORAL | 1 refills | Status: DC

## 2016-12-06 MED ORDER — LORCASERIN HCL 10 MG PO TABS: 10.0000 mg | ORAL_TABLET | Freq: Every day | ORAL | 0 refills | Status: DC

## 2016-12-06 MED ORDER — DOXYCYCLINE HYCLATE 100 MG PO TABS: 100.0000 mg | ORAL_TABLET | Freq: Two times a day (BID) | ORAL | 0 refills | Status: DC

## 2016-12-06 NOTE — Telephone Encounter (Signed)
Routing to provider  

## 2016-12-06 NOTE — Progress Notes (Signed)
BP 90/62 (BP Location: Right Arm, Patient Position: Sitting, Cuff Size: Large)   Pulse 77   Temp 97.8 F (36.6 C)   Ht 5\' 7"  (1.702 m)   Wt 262 lb 1.6 oz (118.9 kg)   SpO2 97%   BMI 41.05 kg/m    Subjective:    Patient ID: Christy Cox, female    DOB: [birth year], 41 y.o.   MRN: [id number]  HPI: Christy Cox is a 41 y.o. female  Chief Complaint  Patient presents with  . Follow-up  . Edema   Patient presents for BP and LE edema f/u. Taking HCTZ daily, tolerating well with no side effects but notes her edema is much improved and thinks she can come off the medication now. BPs remain in the 90/60s which is baseline for pt.  Also still struggling with weight gain the past few years. Fatigues easily from the MS and can't work out the way she used to. Has been trying to eat healthy and getting no results. Belviq was too expensive when previously tried to start medication.   Past Medical History:  Diagnosis Date  . Abscess, abdomen    H/o sterile abscess in right abd  . Bladder disorder, other    Dystonic bladder  . Multiple sclerosis ([place])    c/b transverse myelitis and optic neuritis   Social History   Social History  . Marital status: Married    Spouse name: dennis  . Number of children: 2  . Years of education: College   Occupational History  . n/a     disabled   Social History Main Topics  . Smoking status: Never Smoker  . Smokeless tobacco: Never Used  . Alcohol use No     Comment: on occasion, socially  . Drug use: No  . Sexual activity: Yes    Birth control/ protection: Surgical   Other Topics Concern  . Not on file   Social History Narrative   Pt lives at home with her spouse.    Caffeine Use- 1-2 per week    Relevant past medical, surgical, family and social history reviewed and updated as indicated. Interim medical history since our last visit reviewed. Allergies and medications reviewed and updated.  Review of Systems    Constitutional: Negative.   HENT: Negative.   Respiratory: Negative.   Cardiovascular: Negative.   Gastrointestinal: Negative.   Genitourinary: Negative.   Musculoskeletal: Negative.   Neurological: Negative.   Psychiatric/Behavioral: Negative.    Per HPI unless specifically indicated above     Objective:    BP 90/62 (BP Location: Right Arm, Patient Position: Sitting, Cuff Size: Large)   Pulse 77   Temp 97.8 F (36.6 C)   Ht 5\' 7"  (1.702 m)   Wt 262 lb 1.6 oz (118.9 kg)   SpO2 97%   BMI 41.05 kg/m   Wt Readings from Last 3 Encounters:  12/06/16 262 lb 1.6 oz (118.9 kg)  06/06/16 269 lb (122 kg)  12/07/15 257 lb (116.6 kg)    Physical Exam  Constitutional: She is oriented to person, place, and time. She appears well-developed and well-nourished. No distress.  HENT:  Head: Atraumatic.  Eyes: Pupils are equal, round, and reactive to light. Conjunctivae are normal.  Neck: Normal range of motion. Neck supple.  Cardiovascular: Normal rate.   Pulmonary/Chest: Effort normal and breath sounds normal.  Musculoskeletal: Normal range of motion. She exhibits tenderness (1+ edema b/l LEs).  Neurological: She is alert and oriented to  person, place, and time.  Skin: Skin is warm and dry.  Psychiatric: She has a normal mood and affect. Her behavior is normal.  Nursing note and vitals reviewed.     Assessment & Plan:   Problem List Items Addressed This Visit    None    Visit Diagnoses    Leg edema    -  Primary   Much improved from previous, will cut back to prn HCTZ. Elevate legs, compression hose, increase activity as tolerated, low sodium diet   Weight gain       Will retry belviq with her new coverage. Stressed importance of lifestyle modifications additionally       Follow up plan: Return in about 6 months (around 06/06/2017) for CPE.

## 2016-12-06 NOTE — Telephone Encounter (Signed)
Patient was seen by Apolonio Schneiders today and Apolonio Schneiders was supposed to send over 3 scripts but only 2 went, she is missing the one for [facility] and is hoping it can be sent to [place]  Thanks

## 2016-12-07 ENCOUNTER — Other Ambulatory Visit: Payer: Self-pay | Admitting: Family Medicine

## 2016-12-07 MED ORDER — LORCASERIN HCL 10 MG PO TABS: 10.0000 mg | ORAL_TABLET | Freq: Every day | ORAL | 0 refills | Status: DC

## 2016-12-07 NOTE — Telephone Encounter (Signed)
Already resent

## 2016-12-08 ENCOUNTER — Ambulatory Visit: Payer: Medicare Other | Admitting: Family Medicine

## 2016-12-08 ENCOUNTER — Telehealth: Payer: Self-pay | Admitting: Family Medicine

## 2016-12-08 MED ORDER — LORCASERIN HCL 10 MG PO TABS: 10.0000 mg | ORAL_TABLET | Freq: Two times a day (BID) | ORAL | 0 refills | Status: DC

## 2016-12-08 NOTE — Telephone Encounter (Signed)
Rewritten for BID, ready to fax

## 2016-12-08 NOTE — Telephone Encounter (Signed)
Spoke with patient, she states that the Belviq needs to be written for twice a day for the copay card to work. Can this be done?

## 2016-12-08 NOTE — Patient Instructions (Signed)
Follow up for CPE 

## 2016-12-20 ENCOUNTER — Encounter: Payer: Self-pay | Admitting: Family Medicine

## 2016-12-21 ENCOUNTER — Other Ambulatory Visit: Payer: Self-pay | Admitting: Family Medicine

## 2016-12-21 ENCOUNTER — Encounter: Payer: Self-pay | Admitting: Family Medicine

## 2016-12-21 MED ORDER — BUDESONIDE-FORMOTEROL FUMARATE 160-4.5 MCG/ACT IN AERO: 2.0000 | INHALATION_SPRAY | Freq: Two times a day (BID) | RESPIRATORY_TRACT | 3 refills | Status: DC

## 2016-12-27 DIAGNOSIS — Z9484 Stem cells transplant status: Secondary | ICD-10-CM | POA: Diagnosis not present

## 2016-12-27 DIAGNOSIS — R51 Headache: Secondary | ICD-10-CM | POA: Diagnosis not present

## 2016-12-27 DIAGNOSIS — S0003XA Contusion of scalp, initial encounter: Secondary | ICD-10-CM | POA: Diagnosis not present

## 2016-12-27 DIAGNOSIS — G35 Multiple sclerosis: Secondary | ICD-10-CM | POA: Diagnosis not present

## 2016-12-29 ENCOUNTER — Encounter: Payer: Self-pay | Admitting: Family Medicine

## 2016-12-29 ENCOUNTER — Ambulatory Visit (INDEPENDENT_AMBULATORY_CARE_PROVIDER_SITE_OTHER): Payer: BLUE CROSS/BLUE SHIELD | Admitting: Family Medicine

## 2016-12-29 VITALS — BP 111/79 | HR 84 | Temp 98.2°F | Wt 259.0 lb

## 2016-12-29 DIAGNOSIS — M545 Low back pain, unspecified: Secondary | ICD-10-CM

## 2016-12-29 DIAGNOSIS — N390 Urinary tract infection, site not specified: Secondary | ICD-10-CM

## 2016-12-29 DIAGNOSIS — R531 Weakness: Secondary | ICD-10-CM | POA: Diagnosis not present

## 2016-12-29 MED ORDER — KETOROLAC TROMETHAMINE 60 MG/2ML IM SOLN
60.0000 mg | Freq: Once | INTRAMUSCULAR | Status: AC
  Administered 2016-12-29: 60 mg via INTRAMUSCULAR

## 2016-12-29 MED ORDER — BACLOFEN 10 MG PO TABS: 5.0000 mg | ORAL_TABLET | Freq: Three times a day (TID) | ORAL | 0 refills | Status: DC

## 2016-12-29 MED ORDER — CIPROFLOXACIN HCL 250 MG PO TABS: 250.0000 mg | ORAL_TABLET | Freq: Two times a day (BID) | ORAL | 0 refills | Status: DC

## 2016-12-29 MED ORDER — NAPROXEN 500 MG PO TABS: 500.0000 mg | ORAL_TABLET | Freq: Two times a day (BID) | ORAL | 1 refills | Status: DC

## 2016-12-29 NOTE — Progress Notes (Signed)
BP 111/79   Pulse 84   Temp 98.2 F (36.8 C)   Wt 259 lb (117.5 kg)   SpO2 98%   BMI 40.57 kg/m    Subjective:    Patient ID: Christy Cox, female    DOB: May 18, 1975, 41 y.o.   MRN: [id number]  HPI: Christy Cox is a 40 y.o. female  Chief Complaint  Patient presents with  . Follow-up    Fell on 10/30 and hit her head. Head is still tender, but now her lower back is hurting her from the fall.   Patient presents today for ER follow up for fall with head contusion 2 days ago. Has had 2 falls in the past 3 days. No concussion sxs, head sore over swollen area but not severe. Most bothersome symptom right now is sharp, shooting pain left lower back with any sort of movement and stiff neck. Has not taken anything OTC for pain.  Wants to be checked for UTI today as frequent unexplained falls in conjunction with her MS seem to always come from a UTI. Not noticing any urinary symptoms currently.   Past Medical History:  Diagnosis Date  . Abscess, abdomen    H/o sterile abscess in right abd  . Bladder disorder, other    Dystonic bladder  . Multiple sclerosis ([place])    c/b transverse myelitis and optic neuritis   Social History   Socioeconomic History  . Marital status: Married    Spouse name: dennis  . Number of children: 2  . Years of education: College  . Highest education level: Not on file  Social Needs  . Financial resource strain: Not on file  . Food insecurity - worry: Not on file  . Food insecurity - inability: Not on file  . Transportation needs - medical: Not on file  . Transportation needs - non-medical: Not on file  Occupational History  . Occupation: n/a    Comment: disabled  Tobacco Use  . Smoking status: Never Smoker  . Smokeless tobacco: Never Used  Substance and Sexual Activity  . Alcohol use: No    Comment: on occasion, socially  . Drug use: No  . Sexual activity: Yes    Birth control/protection: Surgical  Other Topics Concern  .  Not on file  Social History Narrative   Pt lives at home with her spouse.    Caffeine Use- 1-2 per week    Relevant past medical, surgical, family and social history reviewed and updated as indicated. Interim medical history since our last visit reviewed. Allergies and medications reviewed and updated.  Review of Systems  Constitutional: Negative.   HENT: Negative.   Respiratory: Negative.   Cardiovascular: Negative.   Gastrointestinal: Negative.   Genitourinary: Negative.   Musculoskeletal: Positive for back pain, myalgias and neck stiffness.  Skin: Negative.   Neurological: Positive for headaches.  Psychiatric/Behavioral: Negative.     Per HPI unless specifically indicated above     Objective:    BP 111/79   Pulse 84   Temp 98.2 F (36.8 C)   Wt 259 lb (117.5 kg)   SpO2 98%   BMI 40.57 kg/m   Wt Readings from Last 3 Encounters:  12/29/16 259 lb (117.5 kg)  12/06/16 262 lb 1.6 oz (118.9 kg)  06/06/16 269 lb (122 kg)    Physical Exam  Constitutional: She is oriented to person, place, and time. She appears well-developed and well-nourished. No distress.  HENT:  Head: Atraumatic.  Eyes: Conjunctivae  are normal. Pupils are equal, round, and reactive to light.  Neck: Normal range of motion. Neck supple.  Cardiovascular: Normal rate and normal heart sounds.  Pulmonary/Chest: Effort normal and breath sounds normal.  Musculoskeletal: Normal range of motion.  Neurological: She is alert and oriented to person, place, and time.  Skin: Skin is warm and dry.  Psychiatric: She has a normal mood and affect.  Nursing note and vitals reviewed.  Results for orders placed or performed in visit on 12/29/16  Microscopic Examination  Result Value Ref Range   WBC, UA 6-10 (A) 0 - 5 /hpf   RBC, UA 0-2 0 - 2 /hpf   Epithelial Cells (non renal) 0-10 0 - 10 /hpf   Bacteria, UA Moderate (A) None seen/Few  Urine Culture, Reflex  Result Value Ref Range   Urine Culture, Routine  Preliminary report (A)    Organism ID, Bacteria Escherichia coli (A)   UA/M w/rflx Culture, Routine  Result Value Ref Range   Specific Gravity, UA 1.010 1.005 - 1.030   pH, UA 5.5 5.0 - 7.5   Color, UA Yellow Yellow   Appearance Ur Cloudy (A) Clear   Leukocytes, UA 1+ (A) Negative   Protein, UA Negative Negative/Trace   Glucose, UA Negative Negative   Ketones, UA 2+ (A) Negative   RBC, UA Trace (A) Negative   Bilirubin, UA Negative Negative   Urobilinogen, Ur 0.2 0.2 - 1.0 mg/dL   Nitrite, UA Positive (A) Negative   Microscopic Examination See below:    Urinalysis Reflex Comment       Assessment & Plan:   Problem List Items Addressed This Visit    None    Visit Diagnoses    Acute lower UTI    -  Primary   U/A + as pt suspected, which explains her recent falls. Will treat with cipro and await culture results.    Relevant Orders   UA/M w/rflx Culture, Routine (Completed)   Acute low back pain without sciatica, unspecified back pain laterality       From recent falls. IM toradol given, start naproxen tomorrow night prn. Muscle relaxer sent if needed. Rest, epsom salt soaks. F/u if no improvement   Relevant Medications   ibuprofen (ADVIL,MOTRIN) 600 MG tablet   baclofen (LIORESAL) 10 MG tablet   naproxen (NAPROSYN) 500 MG tablet   ketorolac (TORADOL) injection 60 mg (Completed)       Follow up plan: Return if symptoms worsen or fail to improve.

## 2017-01-01 NOTE — Patient Instructions (Signed)
Follow up as needed

## 2017-01-04 LAB — UA/M W/RFLX CULTURE, ROUTINE
Bilirubin, UA: NEGATIVE
Glucose, UA: NEGATIVE
Nitrite, UA: POSITIVE — AB
Protein, UA: NEGATIVE
Specific Gravity, UA: 1.01 (ref 1.005–1.030)
Urobilinogen, Ur: 0.2 mg/dL (ref 0.2–1.0)
pH, UA: 5.5 (ref 5.0–7.5)

## 2017-01-04 LAB — MICROSCOPIC EXAMINATION

## 2017-01-04 LAB — URINE CULTURE, REFLEX

## 2017-01-06 ENCOUNTER — Encounter: Payer: Self-pay | Admitting: Family Medicine

## 2017-01-06 ENCOUNTER — Other Ambulatory Visit: Payer: Self-pay | Admitting: Family Medicine

## 2017-01-06 MED ORDER — MECLIZINE HCL 25 MG PO TABS: 25.0000 mg | ORAL_TABLET | Freq: Three times a day (TID) | ORAL | 0 refills | Status: DC | PRN

## 2017-01-06 MED ORDER — ONDANSETRON 4 MG PO TBDP: 4.0000 mg | ORAL_TABLET | Freq: Three times a day (TID) | ORAL | 0 refills | Status: DC | PRN

## 2017-01-12 ENCOUNTER — Encounter: Payer: Self-pay | Admitting: Family Medicine

## 2017-01-13 ENCOUNTER — Other Ambulatory Visit: Payer: Self-pay | Admitting: Family Medicine

## 2017-01-13 MED ORDER — SULFAMETHOXAZOLE-TRIMETHOPRIM 800-160 MG PO TABS: 1.0000 | ORAL_TABLET | Freq: Two times a day (BID) | ORAL | 0 refills | Status: DC

## 2017-01-30 ENCOUNTER — Ambulatory Visit (INDEPENDENT_AMBULATORY_CARE_PROVIDER_SITE_OTHER): Payer: BLUE CROSS/BLUE SHIELD | Admitting: Family Medicine

## 2017-01-30 ENCOUNTER — Encounter: Payer: Self-pay | Admitting: Family Medicine

## 2017-01-30 VITALS — BP 98/67 | HR 73 | Temp 97.6°F | Wt 264.3 lb

## 2017-01-30 DIAGNOSIS — G35 Multiple sclerosis: Secondary | ICD-10-CM

## 2017-01-30 NOTE — Progress Notes (Signed)
   BP 98/67 (BP Location: Left Arm, Patient Position: Sitting, Cuff Size: Normal)   Pulse 73   Temp 97.6 F (36.4 C) (Oral)   Wt 264 lb 4.8 oz (119.9 kg)   SpO2 95%   BMI 41.40 kg/m    Subjective:    Patient ID: Christy Cox, female    DOB: 09-20-1975, 41 y.o.   MRN: [id number]  HPI: Christy Cox is a 41 y.o. female  Chief Complaint  Patient presents with  . Medical Accomadation    To change to work only Part Time. Had transplant in [place]. Paperwork needs to come from PCP b/c patient isn't a regular in [place] where transplant was done.   Needs forms filled out to clear her to reduce back to part time. Had previously been having these filled out by her team in [place] who managed her stem cell transplant but has not been seeing them regularly for over a year.  Is also on disability - needs to work 20 hours per week.   Relevant past medical, surgical, family and social history reviewed and updated as indicated. Interim medical history since our last visit reviewed. Allergies and medications reviewed and updated.  Review of Systems  Constitutional: Negative.   Respiratory: Negative.   Cardiovascular: Negative.   Neurological: Negative.   Psychiatric/Behavioral: Negative.     Per HPI unless specifically indicated above     Objective:    BP 98/67 (BP Location: Left Arm, Patient Position: Sitting, Cuff Size: Normal)   Pulse 73   Temp 97.6 F (36.4 C) (Oral)   Wt 264 lb 4.8 oz (119.9 kg)   SpO2 95%   BMI 41.40 kg/m   Wt Readings from Last 3 Encounters:  01/30/17 264 lb 4.8 oz (119.9 kg)  12/29/16 259 lb (117.5 kg)  12/06/16 262 lb 1.6 oz (118.9 kg)    Physical Exam  Constitutional: She is oriented to person, place, and time. She appears well-developed and well-nourished. No distress.  HENT:  Head: Atraumatic.  Eyes: Conjunctivae are normal. No scleral icterus.  Neck: Normal range of motion. Neck supple.  Cardiovascular: Normal rate.    Pulmonary/Chest: Effort normal. No respiratory distress.  Musculoskeletal: Normal range of motion.  Neurological: She is alert and oriented to person, place, and time.  Skin: Skin is warm and dry.  Psychiatric: She has a normal mood and affect. Her behavior is normal.  Nursing note and vitals reviewed.     Assessment & Plan:   Problem List Items Addressed This Visit      Nervous and Auditory   Multiple sclerosis ([place]) - Primary (Chronic)    Pt will drop by forms to be completed. Currently doing very well without any sxs          Follow up plan: Return for as scheduled.

## 2017-01-31 NOTE — Assessment & Plan Note (Addendum)
Pt will drop by forms to be completed. Currently doing very well without any sxs

## 2017-01-31 NOTE — Patient Instructions (Signed)
Follow up as needed

## 2017-02-13 ENCOUNTER — Ambulatory Visit (INDEPENDENT_AMBULATORY_CARE_PROVIDER_SITE_OTHER): Payer: BLUE CROSS/BLUE SHIELD | Admitting: Family Medicine

## 2017-02-13 VITALS — BP 93/68 | HR 84 | Temp 97.3°F | Wt 263.2 lb

## 2017-02-13 DIAGNOSIS — J069 Acute upper respiratory infection, unspecified: Secondary | ICD-10-CM

## 2017-02-13 MED ORDER — AZITHROMYCIN 250 MG PO TABS: ORAL_TABLET | ORAL | 0 refills | Status: DC

## 2017-02-13 MED ORDER — BENZONATATE 200 MG PO CAPS: 200.0000 mg | ORAL_CAPSULE | Freq: Three times a day (TID) | ORAL | 0 refills | Status: DC | PRN

## 2017-02-13 NOTE — Progress Notes (Signed)
   BP 93/68 (BP Location: Left Arm, Patient Position: Sitting, Cuff Size: Large)   Pulse 84   Temp (!) 97.3 F (36.3 C) (Oral)   Wt 263 lb 3.2 oz (119.4 kg)   SpO2 100%   BMI 41.22 kg/m    Subjective:    Patient ID: Christy Cox, female    DOB: 04/27/1975, 41 y.o.   MRN: [id number]  HPI: Christy Cox is a 41 y.o. female  Chief Complaint  Patient presents with  . Cough    Since 02/10/17. Productive cough, green sputum.   . Nasal Congestion  . Shortness of Breath  . Chest Pain   Productive cough, congestion, SOB, chest tightness, fatigue, aches x 4 days. Denies fever, ear pain, sore throat, N/V/D. Taking OTC cold and flu medications with minimal relief. Watches several children daily who have been sick, on with pneumonia.   Relevant past medical, surgical, family and social history reviewed and updated as indicated. Interim medical history since our last visit reviewed. Allergies and medications reviewed and updated.  Review of Systems  Constitutional: Positive for fatigue.  HENT: Positive for congestion.   Respiratory: Positive for cough, chest tightness and shortness of breath.   Cardiovascular: Negative.   Gastrointestinal: Negative.   Genitourinary: Negative.   Musculoskeletal: Positive for myalgias.  Neurological: Negative.   Psychiatric/Behavioral: Negative.    Per HPI unless specifically indicated above     Objective:    BP 93/68 (BP Location: Left Arm, Patient Position: Sitting, Cuff Size: Large)   Pulse 84   Temp (!) 97.3 F (36.3 C) (Oral)   Wt 263 lb 3.2 oz (119.4 kg)   SpO2 100%   BMI 41.22 kg/m   Wt Readings from Last 3 Encounters:  02/13/17 263 lb 3.2 oz (119.4 kg)  01/30/17 264 lb 4.8 oz (119.9 kg)  12/29/16 259 lb (117.5 kg)    Physical Exam  Constitutional: She is oriented to person, place, and time. She appears well-developed and well-nourished. No distress.  HENT:  Head: Atraumatic.  Right Ear: External ear normal.  Left  Ear: External ear normal.  Oropharynx erythematous and mildly edematous Nasal mucosa erythematous with drainage present  Eyes: Conjunctivae are normal. Pupils are equal, round, and reactive to light. No scleral icterus.  Neck: Normal range of motion. Neck supple.  Cardiovascular: Normal rate and normal heart sounds.  Pulmonary/Chest: Effort normal. She has wheezes (minimal).  Musculoskeletal: Normal range of motion.  Lymphadenopathy:    She has no cervical adenopathy.  Neurological: She is alert and oriented to person, place, and time.  Skin: Skin is warm and dry.  Psychiatric: She has a normal mood and affect. Her behavior is normal.  Nursing note and vitals reviewed.     Assessment & Plan:   Problem List Items Addressed This Visit    None    Visit Diagnoses    Upper respiratory tract infection, unspecified type    -  Primary   Will treat with zpack and tessalon perles. Discussed supportive care, OTC remedies. F/u if worsening or no improvement   Relevant Medications   azithromycin (ZITHROMAX) 250 MG tablet      Follow up plan: Return if symptoms worsen or fail to improve.

## 2017-02-15 ENCOUNTER — Encounter: Payer: Self-pay | Admitting: Family Medicine

## 2017-02-15 NOTE — Patient Instructions (Signed)
Follow up as needed

## 2017-02-16 ENCOUNTER — Other Ambulatory Visit: Payer: Self-pay | Admitting: Family Medicine

## 2017-02-16 ENCOUNTER — Encounter: Payer: Self-pay | Admitting: Family Medicine

## 2017-02-16 MED ORDER — PREDNISONE 10 MG PO TABS: ORAL_TABLET | ORAL | 0 refills | Status: DC

## 2017-02-16 MED ORDER — DOXYCYCLINE HYCLATE 100 MG PO TABS
100.0000 mg | ORAL_TABLET | Freq: Two times a day (BID) | ORAL | 0 refills | Status: DC
Start: 2017-02-16 — End: 2017-05-05

## 2017-03-16 DIAGNOSIS — N3281 Overactive bladder: Secondary | ICD-10-CM | POA: Diagnosis not present

## 2017-03-16 DIAGNOSIS — G35 Multiple sclerosis: Secondary | ICD-10-CM | POA: Diagnosis not present

## 2017-03-16 DIAGNOSIS — R339 Retention of urine, unspecified: Secondary | ICD-10-CM | POA: Diagnosis not present

## 2017-04-19 DIAGNOSIS — G35 Multiple sclerosis: Secondary | ICD-10-CM | POA: Diagnosis not present

## 2017-04-19 DIAGNOSIS — R350 Frequency of micturition: Secondary | ICD-10-CM | POA: Diagnosis not present

## 2017-04-19 DIAGNOSIS — N3281 Overactive bladder: Secondary | ICD-10-CM | POA: Diagnosis not present

## 2017-04-28 DIAGNOSIS — H469 Unspecified optic neuritis: Secondary | ICD-10-CM | POA: Diagnosis not present

## 2017-05-05 ENCOUNTER — Ambulatory Visit (INDEPENDENT_AMBULATORY_CARE_PROVIDER_SITE_OTHER): Payer: BLUE CROSS/BLUE SHIELD

## 2017-05-05 VITALS — BP 99/69 | HR 60 | Temp 98.6°F | Resp 17 | Ht 67.0 in | Wt 269.8 lb

## 2017-05-05 DIAGNOSIS — Z0000 Encounter for general adult medical examination without abnormal findings: Secondary | ICD-10-CM | POA: Diagnosis not present

## 2017-05-05 NOTE — Patient Instructions (Signed)
[name] , Thank you for taking time to come for your Medicare Wellness Visit. I appreciate your ongoing commitment to your health goals. Please review the following plan we discussed and let me know if I can assist you in the future.   Screening recommendations/referrals: Colonoscopy: due at age 42 Mammogram: continue breast cancer screenings annual  Bone Density: due at age 56 Recommended yearly ophthalmology/optometry visit for glaucoma screening and checkup Recommended yearly dental visit for hygiene and checkup  Vaccinations: Influenza vaccine: declined Pneumococcal vaccine: due at age 52 Tdap vaccine: due, check with your insurance company for coverage  Shingles vaccine: due at age 4   Advanced directives: Advance directive discussed with you today. Even though you declined this today please call our office should you change your mind and we can give you the proper paperwork for you to fill out.  Conditions/risks identified: continue weight watchers   Next appointment: Follow up on 06/06/2017 at 10:30am with Dr.Johnson. Follow up in one year for your annual wellness exam.   Preventive Care 40-64 Years, Female Preventive care refers to lifestyle choices and visits with your health care provider that can promote health and wellness. What does preventive care include?  A yearly physical exam. This is also called an annual well check.  Dental exams once or twice a year.  Routine eye exams. Ask your health care provider how often you should have your eyes checked.  Personal lifestyle choices, including:  Daily care of your teeth and gums.  Regular physical activity.  Eating a healthy diet.  Avoiding tobacco and drug use.  Limiting alcohol use.  Practicing safe sex.  Taking low-dose aspirin daily starting at age 3.  Taking vitamin and mineral supplements as recommended by your health care provider. What happens during an annual well check? The services and  screenings done by your health care provider during your annual well check will depend on your age, overall health, lifestyle risk factors, and family history of disease. Counseling  Your health care provider may ask you questions about your:  Alcohol use.  Tobacco use.  Drug use.  Emotional well-being.  Home and relationship well-being.  Sexual activity.  Eating habits.  Work and work Statistician.  Method of birth control.  Menstrual cycle.  Pregnancy history. Screening  You may have the following tests or measurements:  Height, weight, and BMI.  Blood pressure.  Lipid and cholesterol levels. These may be checked every 5 years, or more frequently if you are over 30 years old.  Skin check.  Lung cancer screening. You may have this screening every year starting at age 71 if you have a 30-pack-year history of smoking and currently smoke or have quit within the past 15 years.  Fecal occult blood test (FOBT) of the stool. You may have this test every year starting at age 61.  Flexible sigmoidoscopy or colonoscopy. You may have a sigmoidoscopy every 5 years or a colonoscopy every 10 years starting at age 50.  Hepatitis C blood test.  Hepatitis B blood test.  Sexually transmitted disease (STD) testing.  Diabetes screening. This is done by checking your blood sugar (glucose) after you have not eaten for a while (fasting). You may have this done every 1-3 years.  Mammogram. This may be done every 1-2 years. Talk to your health care provider about when you should start having regular mammograms. This may depend on whether you have a family history of breast cancer.  BRCA-related cancer screening. This may be  done if you have a family history of breast, ovarian, tubal, or peritoneal cancers.  Pelvic exam and Pap test. This may be done every 3 years starting at age 59. Starting at age 42, this may be done every 5 years if you have a Pap test in combination with an HPV  test.  Bone density scan. This is done to screen for osteoporosis. You may have this scan if you are at high risk for osteoporosis. Discuss your test results, treatment options, and if necessary, the need for more tests with your health care provider. Vaccines  Your health care provider may recommend certain vaccines, such as:  Influenza vaccine. This is recommended every year.  Tetanus, diphtheria, and acellular pertussis (Tdap, Td) vaccine. You may need a Td booster every 10 years.  Zoster vaccine. You may need this after age 23.  Pneumococcal 13-valent conjugate (PCV13) vaccine. You may need this if you have certain conditions and were not previously vaccinated.  Pneumococcal polysaccharide (PPSV23) vaccine. You may need one or two doses if you smoke cigarettes or if you have certain conditions. Talk to your health care provider about which screenings and vaccines you need and how often you need them. This information is not intended to replace advice given to you by your health care provider. Make sure you discuss any questions you have with your health care provider. Document Released: 03/13/2015 Document Revised: 11/04/2015 Document Reviewed: 12/16/2014 Elsevier Interactive Patient Education  2017 [place] Prevention in the Home Falls can cause injuries. They can happen to people of all ages. There are many things you can do to make your home safe and to help prevent falls. What can I do on the outside of my home?  Regularly fix the edges of walkways and driveways and fix any cracks.  Remove anything that might make you trip as you walk through a door, such as a raised step or threshold.  Trim any bushes or trees on the path to your home.  Use bright outdoor lighting.  Clear any walking paths of anything that might make someone trip, such as rocks or tools.  Regularly check to see if handrails are loose or broken. Make sure that both sides of any steps have  handrails.  Any raised decks and porches should have guardrails on the edges.  Have any leaves, snow, or ice cleared regularly.  Use sand or salt on walking paths during winter.  Clean up any spills in your garage right away. This includes oil or grease spills. What can I do in the bathroom?  Use night lights.  Install grab bars by the toilet and in the tub and shower. Do not use towel bars as grab bars.  Use non-skid mats or decals in the tub or shower.  If you need to sit down in the shower, use a plastic, non-slip stool.  Keep the floor dry. Clean up any water that spills on the floor as soon as it happens.  Remove soap buildup in the tub or shower regularly.  Attach bath mats securely with double-sided non-slip rug tape.  Do not have throw rugs and other things on the floor that can make you trip. What can I do in the bedroom?  Use night lights.  Make sure that you have a light by your bed that is easy to reach.  Do not use any sheets or blankets that are too big for your bed. They should not hang down onto the  floor.  Have a firm chair that has side arms. You can use this for support while you get dressed.  Do not have throw rugs and other things on the floor that can make you trip. What can I do in the kitchen?  Clean up any spills right away.  Avoid walking on wet floors.  Keep items that you use a lot in easy-to-reach places.  If you need to reach something above you, use a strong step stool that has a grab bar.  Keep electrical cords out of the way.  Do not use floor polish or wax that makes floors slippery. If you must use wax, use non-skid floor wax.  Do not have throw rugs and other things on the floor that can make you trip. What can I do with my stairs?  Do not leave any items on the stairs.  Make sure that there are handrails on both sides of the stairs and use them. Fix handrails that are broken or loose. Make sure that handrails are as long as  the stairways.  Check any carpeting to make sure that it is firmly attached to the stairs. Fix any carpet that is loose or worn.  Avoid having throw rugs at the top or bottom of the stairs. If you do have throw rugs, attach them to the floor with carpet tape.  Make sure that you have a light switch at the top of the stairs and the bottom of the stairs. If you do not have them, ask someone to add them for you. What else can I do to help prevent falls?  Wear shoes that:  Do not have high heels.  Have rubber bottoms.  Are comfortable and fit you well.  Are closed at the toe. Do not wear sandals.  If you use a stepladder:  Make sure that it is fully opened. Do not climb a closed stepladder.  Make sure that both sides of the stepladder are locked into place.  Ask someone to hold it for you, if possible.  Clearly mark and make sure that you can see:  Any grab bars or handrails.  First and last steps.  Where the edge of each step is.  Use tools that help you move around (mobility aids) if they are needed. These include:  Canes.  Walkers.  Scooters.  Crutches.  Turn on the lights when you go into a dark area. Replace any light bulbs as soon as they burn out.  Set up your furniture so you have a clear path. Avoid moving your furniture around.  If any of your floors are uneven, fix them.  If there are any pets around you, be aware of where they are.  Review your medicines with your doctor. Some medicines can make you feel dizzy. This can increase your chance of falling. Ask your doctor what other things that you can do to help prevent falls. This information is not intended to replace advice given to you by your health care provider. Make sure you discuss any questions you have with your health care provider. Document Released: 12/11/2008 Document Revised: 07/23/2015 Document Reviewed: 03/21/2014 Elsevier Interactive Patient Education  2017 [employer].

## 2017-05-05 NOTE — Progress Notes (Signed)
Subjective:   Christy Cox is a 42 y.o. female who presents for an Initial Medicare Annual Wellness Visit.  Review of Systems      Cardiac Risk Factors include: obesity (BMI >30kg/m2)     Objective:    Today's Vitals   05/05/17 1030  BP: 99/69  Pulse: 60  Resp: 17  Temp: 98.6 F (37 C)  TempSrc: Temporal  Weight: 269 lb 12.8 oz (122.4 kg)  Height: 5\' 7"  (1.702 m)   Body mass index is 42.26 kg/m.  Advanced Directives 05/05/2017  Does Patient Have a Medical Advance Directive? No  Would patient like information on creating a medical advance directive? No - Patient declined    Current Medications (verified) Outpatient Encounter Medications as of 05/05/2017  Medication Sig  . oxybutynin (DITROPAN) 5 MG tablet Take 5 mg by mouth 3 (three) times daily.  . [DISCONTINUED] azithromycin (ZITHROMAX) 250 MG tablet Take 2 tabs day one, then 1 tab daily until complete (Patient not taking: Reported on 05/05/2017)  . [DISCONTINUED] benzonatate (TESSALON) 200 MG capsule Take 1 capsule (200 mg total) by mouth 3 (three) times daily as needed. (Patient not taking: Reported on 05/05/2017)  . [DISCONTINUED] doxycycline (VIBRA-TABS) 100 MG tablet Take 1 tablet (100 mg total) by mouth 2 (two) times daily. (Patient not taking: Reported on 05/05/2017)  . [DISCONTINUED] predniSONE (DELTASONE) 10 MG tablet Take 6 tabs day one, 5 tabs day two, 4 tabs day three, etc (Patient not taking: Reported on 05/05/2017)   No facility-administered encounter medications on file as of 05/05/2017.     Allergies (verified) Nitrofurantoin and Macrodantin [nitrofurantoin macrocrystal]   History: Past Medical History:  Diagnosis Date  . Abscess, abdomen    H/o sterile abscess in right abd  . Bladder disorder, other    Dystonic bladder  . 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 bladder botox    . [place], 2003   . HERNIA REPAIR    . LIMBAL STEM CELL TRANSPLANT  2017  . Tubes tied     Family History  Problem Relation Age of Onset  . Breast cancer Mother   . Heart attack Father 83       x2 at 55 them at 81, now s/p CABG  . Kidney disease Father   . Cancer Other   . Coronary artery disease Other   . Diabetes Other   . Cancer Paternal Grandmother    Social History   Socioeconomic History  . Marital status: Legally Separated    Spouse name: dennis  . Number of children: 2  . Years of education: College  . Highest education level: None  Social Needs  . Financial resource strain: Not hard at all  . Food insecurity - worry: Never true  . Food insecurity - inability: Never true  . Transportation needs - medical: No  . Transportation needs - non-medical: No  Occupational History  . Occupation: n/a    Comment: disabled  Tobacco Use  . Smoking status: Never Smoker  . Smokeless tobacco: Never Used  Substance and Sexual Activity  . Alcohol use: No    Comment: on occasion, socially  . Drug use: No  . Sexual activity: Yes    Birth control/protection: Surgical  Other Topics Concern  . None  Social History Narrative   Pt lives at home with her spouse.    Caffeine Use- 1-2  per week    Tobacco Counseling Counseling given: Not Answered   Clinical Intake:  Pre-visit preparation completed: Yes  Pain : No/denies pain     Nutritional Status: BMI > 30  Obese Nutritional Risks: None Diabetes: No  How often do you need to have someone help you when you read instructions, pamphlets, or other written materials from your doctor or pharmacy?: 1 - Never What is the last grade level you completed in school?: 2 years college   Interpreter Needed?: No  Information entered by :: Christy Hauss,LPN    Activities of Daily Living In your present state of health, do you have any difficulty performing the following activities: 05/05/2017  Hearing? N  Vision? N  Difficulty concentrating or  making decisions? N  Walking or climbing stairs? N  Dressing or bathing? N  Doing errands, shopping? N  Preparing Food and eating ? N  Using the Toilet? N  In the past six months, have you accidently leaked urine? Y  Comment has bladder botox every 6 months   Do you have problems with loss of bowel control? N  Managing your Medications? N  Managing your Finances? N  Housekeeping or managing your Housekeeping? N  Some recent data might be hidden     Immunizations and Health Maintenance  There is no immunization history on file for this patient. Health Maintenance Due  Topic Date Due  . PAP SMEAR  12/29/2016    Patient Care Team: Volney American, PA-C as PCP - General (Family Medicine)  Indicate any recent Medical Services you may have received from other than [facility] providers in the past year (date may be approximate).     Assessment:   This is a routine wellness examination for Christy Cox.  Hearing/Vision screen Vision Screening Comments: Goes to [facility]   Dietary issues and exercise activities discussed: Current Exercise Habits: The patient does not participate in regular exercise at present, Intensity: Mild, Exercise limited by: neurologic condition(s)(MS)  Goals    . Weight (lb) < 200 lb (90.7 kg)     On weight watchers currently.       Depression Screen PHQ 2/9 Scores 05/05/2017 12/06/2016  PHQ - 2 Score 0 0  PHQ- 9 Score - 0    Fall Risk Fall Risk  05/05/2017 02/25/2015  Falls in the past year? Yes Yes  Number falls in past yr: 1 1  Injury with Fall? No Yes  Comment - sprained right ankle  Risk for fall due to : - Impaired balance/gait  Follow up Falls prevention discussed -    Is the patient's home free of loose throw rugs in walkways, pet beds, electrical cords, etc?   yes      Grab bars in the bathroom? yes      Handrails on the stairs?    No stairs      Adequate lighting?   yes  Timed Get Up and Go Performed Completed in 8 seconds with  no use of assistive devices, steady gait. No intervention needed at this time.   Cognitive Function:        Screening Tests Health Maintenance  Topic Date Due  . PAP SMEAR  12/29/2016  . INFLUENZA VACCINE  05/28/2017 (Originally 09/28/2016)  . TETANUS/TDAP  12/07/2019 (Originally 03/29/1994)  . HIV Screening  Completed    Qualifies for Shingles Vaccine? No.   Cancer Screenings: Lung: Low Dose CT Chest recommended if Age 12-80 years, 30 pack-year currently smoking OR have quit  w/in 15years. Patient does not qualify. Breast: Up to date on Mammogram? Started at 66 due to family history  Up to date of Bone Density/Dexa? Not due yet, du at age 63 Colorectal: due at age 64  Additional Screenings:  Hepatitis B/HIV/Syphillis: completed  Hepatitis C Screening: not indicated     Plan:    I have personally reviewed and addressed the Medicare Annual Wellness questionnaire and have noted the following in the patient's chart:  A. Medical and social history B. Use of alcohol, tobacco or illicit drugs  C. Current medications and supplements D. Functional ability and status E.  Nutritional status F.  Physical activity G. Advance directives H. List of other physicians I.  Hospitalizations, surgeries, and ER visits in previous 12 months J.  [place] such as hearing and vision if needed, cognitive and depression L. Referrals and appointments   In addition, I have reviewed and discussed with patient certain preventive protocols, quality metrics, and best practice recommendations. A written personalized care plan for preventive services as well as general preventive health recommendations were provided to patient.   Signed,  Tyler Aas, LPN Nurse Health Advisor   Nurse Notes: patient brought [employer] [facility] paperwork in to be signed.  Form filled out, signed by pcp and copy placed to be scanned.

## 2017-06-06 ENCOUNTER — Ambulatory Visit: Payer: BLUE CROSS/BLUE SHIELD | Admitting: Family Medicine

## 2017-06-07 ENCOUNTER — Ambulatory Visit
Admission: RE | Admit: 2017-06-07 | Discharge: 2017-06-07 | Disposition: A | Discharge status: Home / Self Care | Payer: BLUE CROSS/BLUE SHIELD | Source: Ambulatory Visit | Attending: Family Medicine | Admitting: Family Medicine

## 2017-06-07 ENCOUNTER — Ambulatory Visit (INDEPENDENT_AMBULATORY_CARE_PROVIDER_SITE_OTHER): Payer: BLUE CROSS/BLUE SHIELD | Admitting: Family Medicine

## 2017-06-07 ENCOUNTER — Telehealth: Payer: Self-pay | Admitting: Family Medicine

## 2017-06-07 ENCOUNTER — Ambulatory Visit: Payer: BLUE CROSS/BLUE SHIELD | Admitting: Family Medicine

## 2017-06-07 ENCOUNTER — Encounter: Payer: Self-pay | Admitting: Family Medicine

## 2017-06-07 VITALS — BP 92/64 | HR 76 | Temp 97.6°F | Wt 269.8 lb

## 2017-06-07 DIAGNOSIS — R05 Cough: Secondary | ICD-10-CM | POA: Diagnosis not present

## 2017-06-07 DIAGNOSIS — J069 Acute upper respiratory infection, unspecified: Secondary | ICD-10-CM

## 2017-06-07 MED ORDER — AZITHROMYCIN 250 MG PO TABS: ORAL_TABLET | ORAL | 0 refills | Status: DC

## 2017-06-07 MED ORDER — PREDNISONE 20 MG PO TABS: 40.0000 mg | ORAL_TABLET | Freq: Every day | ORAL | 0 refills | Status: DC

## 2017-06-07 NOTE — Telephone Encounter (Signed)
Called pt back regarding negative chest x-ray. Discussed plan of care with abx, prednisone burst, OTC remedies. Pt to f/u if no improvement over next few days  Copied from [place] [id number]. Topic: Inquiry >> Jun 07, 2017  3:41 PM Arletha Grippe wrote: Reason for CRM: pt is asking about results of xray from [facility] and also about medication that was supposed to be called in.  Please call [phone]

## 2017-06-07 NOTE — Progress Notes (Signed)
BP 92/64 (BP Location: Left Arm, Patient Position: Sitting, Cuff Size: Normal)   Pulse 76   Temp 97.6 F (36.4 C) (Oral)   Wt 269 lb 12.8 oz (122.4 kg)   SpO2 98%   BMI 42.26 kg/m    Subjective:    Patient ID: Christy Cox, female    DOB: 01-16-1976, 42 y.o.   MRN: [id number]  HPI: Christy Cox is a 42 y.o. female  Chief Complaint  Patient presents with  . Cough    Ongoing for 1 week. Sometimes productive cough.  . Nasal Congestion  . Wheezing  . Chest Congestion   Productive cough, chest tightness, wheezing, SOB, sweats, generalized body aches, fatigue, congestion, HAs x 1+ week. Trying nyquil, dayquil, cough drops with no relief. Several sick contacts. Has been taking the symbicort sample given previously which didn't help much. Denies CP, N/V/D, ear pain. States she's had a hard time getting rid of URIs since stem cell transplant for MS several years ago. Has had pneumonia several times. Nonsmoker.   Relevant past medical, surgical, family and social history reviewed and updated as indicated. Interim medical history since our last visit reviewed. Allergies and medications reviewed and updated.  Review of Systems  Per HPI unless specifically indicated above     Objective:    BP 92/64 (BP Location: Left Arm, Patient Position: Sitting, Cuff Size: Normal)   Pulse 76   Temp 97.6 F (36.4 C) (Oral)   Wt 269 lb 12.8 oz (122.4 kg)   SpO2 98%   BMI 42.26 kg/m   Wt Readings from Last 3 Encounters:  06/07/17 269 lb 12.8 oz (122.4 kg)  05/05/17 269 lb 12.8 oz (122.4 kg)  02/13/17 263 lb 3.2 oz (119.4 kg)    Physical Exam  Constitutional: She is oriented to person, place, and time. She appears well-developed and well-nourished.  HENT:  Head: Atraumatic.  Right Ear: External ear normal.  Left Ear: External ear normal.  Oropharynx and nasal mucosa erythematous and edematous, discharge present  Eyes: Pupils are equal, round, and reactive to light.  Conjunctivae are normal.  Neck: Normal range of motion. Neck supple.  Cardiovascular: Normal rate and regular rhythm.  Pulmonary/Chest: Effort normal. No respiratory distress. She has wheezes. She has rales (cleared with coughing spell).  Musculoskeletal: Normal range of motion.  Neurological: She is alert and oriented to person, place, and time.  Skin: Skin is warm and dry.  Psychiatric: She has a normal mood and affect. Her behavior is normal.  Nursing note and vitals reviewed.   Results for orders placed or performed in visit on 12/29/16  Microscopic Examination  Result Value Ref Range   WBC, UA 6-10 (A) 0 - 5 /hpf   RBC, UA 0-2 0 - 2 /hpf   Epithelial Cells (non renal) 0-10 0 - 10 /hpf   Bacteria, UA Moderate (A) None seen/Few  Urine Culture, Reflex  Result Value Ref Range   Urine Culture, Routine Final report (A)    Organism ID, Bacteria Escherichia coli (A)    Antimicrobial Susceptibility Comment   UA/M w/rflx Culture, Routine  Result Value Ref Range   Specific Gravity, UA 1.010 1.005 - 1.030   pH, UA 5.5 5.0 - 7.5   Color, UA Yellow Yellow   Appearance Ur Cloudy (A) Clear   Leukocytes, UA 1+ (A) Negative   Protein, UA Negative Negative/Trace   Glucose, UA Negative Negative   Ketones, UA 2+ (A) Negative   RBC, UA Trace (  A) Negative   Bilirubin, UA Negative Negative   Urobilinogen, Ur 0.2 0.2 - 1.0 mg/dL   Nitrite, UA Positive (A) Negative   Microscopic Examination See below:    Urinalysis Reflex Comment       Assessment & Plan:   Problem List Items Addressed This Visit    None    Visit Diagnoses    Upper respiratory tract infection, unspecified type    -  Primary   Will tx with zpack, prednisone, continued symbicort use, supportive care. Return precautions reviewed at length. F/u if no better. Await CXR for pneumonia r/o   Relevant Medications   azithromycin (ZITHROMAX) 250 MG tablet   Other Relevant Orders   DG Chest 2 View (Completed)       Follow up  plan: No follow-ups on file.

## 2017-06-07 NOTE — Patient Instructions (Signed)
[facility] [address], [place], [place] [address]   

## 2017-07-25 DIAGNOSIS — Z9484 Stem cells transplant status: Secondary | ICD-10-CM | POA: Diagnosis not present

## 2017-07-25 DIAGNOSIS — G35 Multiple sclerosis: Secondary | ICD-10-CM | POA: Diagnosis not present

## 2017-07-25 DIAGNOSIS — R5382 Chronic fatigue, unspecified: Secondary | ICD-10-CM | POA: Diagnosis not present

## 2017-07-26 DIAGNOSIS — G35 Multiple sclerosis: Secondary | ICD-10-CM | POA: Diagnosis not present

## 2017-07-26 DIAGNOSIS — Z9484 Stem cells transplant status: Secondary | ICD-10-CM | POA: Diagnosis not present

## 2017-07-26 DIAGNOSIS — R5382 Chronic fatigue, unspecified: Secondary | ICD-10-CM | POA: Diagnosis not present

## 2017-08-21 DIAGNOSIS — R339 Retention of urine, unspecified: Secondary | ICD-10-CM | POA: Diagnosis not present

## 2017-09-07 DIAGNOSIS — R8271 Bacteriuria: Secondary | ICD-10-CM | POA: Diagnosis not present

## 2017-09-07 DIAGNOSIS — R3915 Urgency of urination: Secondary | ICD-10-CM | POA: Diagnosis not present

## 2017-09-07 DIAGNOSIS — G35 Multiple sclerosis: Secondary | ICD-10-CM | POA: Diagnosis not present

## 2017-09-07 DIAGNOSIS — R3914 Feeling of incomplete bladder emptying: Secondary | ICD-10-CM | POA: Diagnosis not present

## 2017-09-07 DIAGNOSIS — R350 Frequency of micturition: Secondary | ICD-10-CM | POA: Diagnosis not present

## 2017-09-18 ENCOUNTER — Other Ambulatory Visit: Payer: Self-pay | Admitting: Urology

## 2017-09-20 MED ORDER — ONABOTULINUMTOXINA 100 UNITS IJ SOLR: 100.0000 [IU] | Freq: Once | INTRAMUSCULAR | Status: AC

## 2017-09-21 ENCOUNTER — Encounter (HOSPITAL_BASED_OUTPATIENT_CLINIC_OR_DEPARTMENT_OTHER): Payer: Self-pay | Admitting: *Deleted

## 2017-09-21 ENCOUNTER — Other Ambulatory Visit: Payer: Self-pay

## 2017-09-21 NOTE — Progress Notes (Signed)
Spoke w/ pt via phone for pre-op interview.  Npo after mn.  Arrive at 0830.

## 2017-09-25 ENCOUNTER — Encounter

## 2017-09-25 ENCOUNTER — Ambulatory Visit: Payer: BLUE CROSS/BLUE SHIELD | Admitting: Urology

## 2017-09-25 NOTE — H&P (Signed)
Urology Preoperative H&P   Chief Complaint: Urinary urgency  History of Present Illness: Christy Cox is a 42 y.o. female with a history of multiple sclerosis (treated w/ stem cell transplant and chemo), recurrent UTIs, incomplete emptying and urinary urgency for the past 8 years (previously treated with Botox injections--failed multiple OAB medications)   From a urinary standpoint, she reports a weak FOS and feels like she isn't emptying her bladder well. She performs CIC 4-6 times per day without any issues or discomfort. After botox injections, she has occasional urgency/frequency, but is not bothered by it. Without Botox, she states that she has constant urgency/frequency as well as urge incontinence. Nocturia x 0-1. Denies interval UTIs, dysuria or hematuria. Failed Vesicare, Oxybutynin and Myrbetriq.   Past Medical History:  Diagnosis Date  . H/O stem cell transplant ([place]) 08-05-2015   [place] , [place]   [place] Stem Cell Transplant after chemotherapy  . Multiple sclerosis [facility]) previous neurologist-- [facility]  since stem cell transplant in 08/05/2015 done in [place], [place]    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  . OAB (overactive bladder)   . PONV (postoperative nausea and vomiting)    severe    Past Surgical History:  Procedure Laterality Date  . CESAREAN SECTION  1999; 2003   BILATERAL TUBAL LIGATION W/ LAST C/S  . CYSTO/  BOTOX INJECTION  multiple-- last one 2018 @ [facility]  . HERNIA REPAIR  age 29 and age 28    Allergies:  Allergies  Allergen Reactions  . Macrodantin [Nitrofurantoin Macrocrystal] Other (See Comments)    Caused elevated liver function and jaundice    Family History  Problem Relation Age of Onset  . Breast cancer Mother   . Heart attack Father 16       x2 at 13 them at 99, now s/p CABG  . Kidney disease Father   . Cancer Other   . Coronary artery disease Other   . Diabetes Other   . Cancer Paternal Grandmother      Social History:  reports that she has never smoked. She has never used smokeless tobacco. She reports that she drank alcohol. She reports that she does not use drugs.  ROS: A complete review of systems was performed.  All systems are negative except for pertinent findings as noted.  Physical Exam:  Vital signs in last 24 hours:   Constitutional:  Alert and oriented, No acute distress Cardiovascular: Regular rate and rhythm, No JVD Respiratory: Normal respiratory effort, Lungs clear bilaterally GI: Abdomen is soft, nontender, nondistended, no abdominal masses GU: No CVA tenderness Lymphatic: No lymphadenopathy Neurologic: Grossly intact, no focal deficits Psychiatric: Normal mood and affect  Laboratory Data:  No results for input(s): WBC, HGB, HCT, PLT in the last 72 hours.  No results for input(s): NA, K, CL, GLUCOSE, BUN, CALCIUM, CREATININE in the last 72 hours.  Invalid input(s): CO3   No results found for this or any previous visit (from the past 24 hour(s)). No results found for this or any previous visit (from the past 240 hour(s)).  Renal Function: No results for input(s): CREATININE in the last 168 hours. CrCl cannot be calculated (Patient's most recent lab result is older than the maximum 21 days allowed.).  Radiologic Imaging: No results found.  I independently reviewed the above imaging studies.  Assessment and Plan Christy Cox is a 42 y.o. female with a history of multiple sclerosis and urinary urgency with urge incontinence  Will proceed with cystoscopy and botox injections. The risks, benefits and alternatives were discussed with the patient. She voices understanding and wishes to proceed.    Ellison Hughs, MD 09/25/2017, 6:45 AM  [facility] Pager: [phone]

## 2017-09-26 ENCOUNTER — Encounter (HOSPITAL_BASED_OUTPATIENT_CLINIC_OR_DEPARTMENT_OTHER)
Admission: RE | Disposition: A | Discharge status: Home / Self Care | Payer: Self-pay | Source: Ambulatory Visit | Attending: Urology

## 2017-09-26 ENCOUNTER — Ambulatory Visit (HOSPITAL_BASED_OUTPATIENT_CLINIC_OR_DEPARTMENT_OTHER): Payer: BLUE CROSS/BLUE SHIELD | Admitting: Anesthesiology

## 2017-09-26 ENCOUNTER — Ambulatory Visit (HOSPITAL_BASED_OUTPATIENT_CLINIC_OR_DEPARTMENT_OTHER)
Admission: RE | Admit: 2017-09-26 | Discharge: 2017-09-26 | Disposition: A | Discharge status: Home / Self Care | Payer: BLUE CROSS/BLUE SHIELD | Source: Ambulatory Visit | Attending: Urology | Admitting: Urology

## 2017-09-26 ENCOUNTER — Encounter (HOSPITAL_BASED_OUTPATIENT_CLINIC_OR_DEPARTMENT_OTHER): Payer: Self-pay | Admitting: General Practice

## 2017-09-26 DIAGNOSIS — N3941 Urge incontinence: Secondary | ICD-10-CM | POA: Insufficient documentation

## 2017-09-26 DIAGNOSIS — Z809 Family history of malignant neoplasm, unspecified: Secondary | ICD-10-CM | POA: Diagnosis not present

## 2017-09-26 DIAGNOSIS — Z6841 Body Mass Index (BMI) 40.0 and over, adult: Secondary | ICD-10-CM | POA: Insufficient documentation

## 2017-09-26 DIAGNOSIS — Z87440 Personal history of urinary (tract) infections: Secondary | ICD-10-CM | POA: Insufficient documentation

## 2017-09-26 DIAGNOSIS — Z841 Family history of disorders of kidney and ureter: Secondary | ICD-10-CM | POA: Diagnosis not present

## 2017-09-26 DIAGNOSIS — Z803 Family history of malignant neoplasm of breast: Secondary | ICD-10-CM | POA: Insufficient documentation

## 2017-09-26 DIAGNOSIS — Z9484 Stem cells transplant status: Secondary | ICD-10-CM | POA: Insufficient documentation

## 2017-09-26 DIAGNOSIS — R339 Retention of urine, unspecified: Secondary | ICD-10-CM | POA: Diagnosis not present

## 2017-09-26 DIAGNOSIS — N390 Urinary tract infection, site not specified: Secondary | ICD-10-CM | POA: Diagnosis not present

## 2017-09-26 DIAGNOSIS — Z881 Allergy status to other antibiotic agents status: Secondary | ICD-10-CM | POA: Insufficient documentation

## 2017-09-26 DIAGNOSIS — Z923 Personal history of irradiation: Secondary | ICD-10-CM | POA: Insufficient documentation

## 2017-09-26 DIAGNOSIS — G35 Multiple sclerosis: Secondary | ICD-10-CM | POA: Insufficient documentation

## 2017-09-26 DIAGNOSIS — Z833 Family history of diabetes mellitus: Secondary | ICD-10-CM | POA: Insufficient documentation

## 2017-09-26 DIAGNOSIS — Z8249 Family history of ischemic heart disease and other diseases of the circulatory system: Secondary | ICD-10-CM | POA: Insufficient documentation

## 2017-09-26 DIAGNOSIS — N3281 Overactive bladder: Secondary | ICD-10-CM | POA: Insufficient documentation

## 2017-09-26 DIAGNOSIS — R3914 Feeling of incomplete bladder emptying: Secondary | ICD-10-CM | POA: Diagnosis not present

## 2017-09-26 HISTORY — DX: Overactive bladder: N32.81

## 2017-09-26 HISTORY — DX: Stem cells transplant status: Z94.84

## 2017-09-26 HISTORY — DX: Other specified postprocedural states: Z98.890

## 2017-09-26 HISTORY — PX: CYSTOSCOPY WITH INJECTION: SHX1424

## 2017-09-26 HISTORY — DX: Other specified postprocedural states: R11.2

## 2017-09-26 SURGERY — CYSTOSCOPY, WITH INJECTION OF BLADDER NECK OR BLADDER WALL
Anesthesia: Monitor Anesthesia Care

## 2017-09-26 MED ORDER — LACTATED RINGERS IV SOLN
INTRAVENOUS | Status: DC
  Filled 2017-09-26: qty 1000

## 2017-09-26 MED ORDER — PROMETHAZINE HCL 25 MG/ML IJ SOLN
6.2500 mg | INTRAMUSCULAR | Status: DC | PRN
  Filled 2017-09-26: qty 1

## 2017-09-26 MED ORDER — SODIUM CHLORIDE 0.9 % IJ SOLN
INTRAMUSCULAR | Status: DC | PRN
  Administered 2017-09-26: 20 mL

## 2017-09-26 MED ORDER — ALPRAZOLAM 0.5 MG PO TABS
ORAL_TABLET | ORAL | Status: AC
  Filled 2017-09-26: qty 2

## 2017-09-26 MED ORDER — STERILE WATER FOR IRRIGATION IR SOLN
Status: DC | PRN
  Administered 2017-09-26: 3000 mL via INTRAVESICAL

## 2017-09-26 MED ORDER — ONABOTULINUMTOXINA 100 UNITS IJ SOLR
INTRAMUSCULAR | Status: DC | PRN
  Administered 2017-09-26: 200 [IU] via INTRAMUSCULAR

## 2017-09-26 MED ORDER — ONABOTULINUMTOXINA 100 UNITS IJ SOLR
INTRAMUSCULAR | Status: AC
  Filled 2017-09-26: qty 100

## 2017-09-26 MED ORDER — OXYCODONE HCL 5 MG/5ML PO SOLN
5.0000 mg | Freq: Once | ORAL | Status: DC | PRN
  Filled 2017-09-26: qty 5

## 2017-09-26 MED ORDER — ALPRAZOLAM 1 MG PO TABS
1.0000 mg | ORAL_TABLET | Freq: Once | ORAL | Status: AC
  Administered 2017-09-26: 1 mg via ORAL
  Filled 2017-09-26: qty 1

## 2017-09-26 MED ORDER — PHENAZOPYRIDINE HCL 200 MG PO TABS: 200.0000 mg | ORAL_TABLET | Freq: Three times a day (TID) | ORAL | 0 refills | Status: DC | PRN

## 2017-09-26 MED ORDER — SODIUM CHLORIDE 0.9 % IJ SOLN
INTRAMUSCULAR | Status: AC
  Filled 2017-09-26: qty 50

## 2017-09-26 MED ORDER — OXYCODONE HCL 5 MG PO TABS
5.0000 mg | ORAL_TABLET | Freq: Once | ORAL | Status: DC | PRN
  Filled 2017-09-26: qty 1

## 2017-09-26 MED ORDER — CIPROFLOXACIN IN D5W 400 MG/200ML IV SOLN
400.0000 mg | Freq: Once | INTRAVENOUS | Status: DC
  Filled 2017-09-26: qty 200

## 2017-09-26 MED ORDER — FENTANYL CITRATE (PF) 100 MCG/2ML IJ SOLN
25.0000 ug | INTRAMUSCULAR | Status: DC | PRN
  Filled 2017-09-26: qty 1

## 2017-09-26 SURGICAL SUPPLY — 17 items
BAG DRAIN URO-CYSTO SKYTR STRL (DRAIN) ×2 IMPLANT
CATH ROBINSON RED A/P 14FR (CATHETERS) IMPLANT
CLOTH BEACON ORANGE TIMEOUT ST (SAFETY) ×2 IMPLANT
ELECT REM PT RETURN 9FT ADLT (ELECTROSURGICAL) ×2
ELECTRODE REM PT RTRN 9FT ADLT (ELECTROSURGICAL) ×1 IMPLANT
GLOVE BIO SURGEON STRL SZ7.5 (GLOVE) ×2 IMPLANT
GOWN STRL REUS W/ TWL XL LVL3 (GOWN DISPOSABLE) ×3 IMPLANT
GOWN STRL REUS W/TWL XL LVL3 (GOWN DISPOSABLE) ×3
KIT TURNOVER CYSTO (KITS) ×2 IMPLANT
MANIFOLD NEPTUNE II (INSTRUMENTS) ×2 IMPLANT
NEEDLE ASPIRATION 22 (NEEDLE) IMPLANT
NEEDLE HYPO 18GX1.5 BLUNT FILL (NEEDLE) IMPLANT
PACK CYSTO (CUSTOM PROCEDURE TRAY) ×2 IMPLANT
SYR 20CC LL (SYRINGE) IMPLANT
SYR CONTROL 10ML LL (SYRINGE) IMPLANT
TUBE CONNECTING 12X1/4 (SUCTIONS) ×2 IMPLANT
WATER STERILE IRR 3000ML UROMA (IV SOLUTION) ×2 IMPLANT

## 2017-09-26 NOTE — Anesthesia Preprocedure Evaluation (Addendum)
Anesthesia Evaluation  Patient identified by MRN, date of birth, ID band Patient awake    Reviewed: Allergy & Precautions, NPO status , Patient's Chart, lab work & pertinent test results  History of Anesthesia Complications (+) PONV  Airway Mallampati: II  TM Distance: >3 FB Neck ROM: Full    Dental no notable dental hx.    Pulmonary neg pulmonary ROS,    Pulmonary exam normal breath sounds clear to auscultation       Cardiovascular negative cardio ROS Normal cardiovascular exam Rhythm:Regular Rate:Normal     Neuro/Psych  Multiple sclerosis   Neuromuscular disease negative neurological ROS  negative psych ROS   GI/Hepatic negative GI ROS, Neg liver ROS,   Endo/Other  Morbid obesity  Renal/GU negative Renal ROS    Overactive bladder  negative genitourinary   Musculoskeletal negative musculoskeletal ROS (+)   Abdominal   Peds negative pediatric ROS (+)  Hematology negative hematology ROS (+)   Anesthesia Other Findings   Reproductive/Obstetrics negative OB ROS                            Anesthesia Physical Anesthesia Plan  ASA: III  Anesthesia Plan:    Post-op Pain Management:    Induction: Intravenous  PONV Risk Score and Plan: Treatment may vary due to age or medical condition  Airway Management Planned: Natural Airway  Additional Equipment: None  Intra-op Plan:   Post-operative Plan:   Informed Consent: I have reviewed the patients History and Physical, chart, labs and discussed the procedure including the risks, benefits and alternatives for the proposed anesthesia with the patient or authorized representative who has indicated his/her understanding and acceptance.   Dental advisory given  Plan Discussed with: CRNA, Anesthesiologist and Surgeon  Anesthesia Plan Comments: (No anesthesia to be given other than po xanax. Patient understands that we still have to  monitor and that there will be an anesthesia charge. )       Anesthesia Quick Evaluation

## 2017-09-26 NOTE — Discharge Instructions (Signed)

## 2017-09-26 NOTE — Progress Notes (Addendum)
Patient stated that she did not want to go under aensthesia, she did not want an IV. Dr. Kalman Shan and Dr. Lovena Neighbours were made aware and they went to talk with the patient. Dr. Kalman Shan put in an order for 1mg  xanax PO once and stated we did not have to put in an IV. Also, discontinued IV antibiotics and IV fluids.

## 2017-09-26 NOTE — Transfer of Care (Signed)
Immediate Anesthesia Transfer of Care Note  Patient: Christy Cox  Procedure(s) Performed: CYSTOSCOPY WITH INJECTION/ BOTOX (N/A )  Patient Location: PACU  Anesthesia Type:MAC  Level of Consciousness: awake, alert , oriented and patient cooperative  Airway & Oxygen Therapy: Patient Spontanous Breathing  Post-op Assessment: Report given to RN and Post -op Vital signs reviewed and stable  Post vital signs: Reviewed and stable  Last Vitals:  Vitals Value Taken Time  BP    Temp    Pulse    Resp    SpO2      Last Pain:  Vitals:   09/26/17 1025  TempSrc: Oral  PainSc: 3       Patients Stated Pain Goal: 8 (76/15/18 3437)  Complications: No apparent anesthesia complications

## 2017-09-26 NOTE — Op Note (Signed)
Operative Note  Preoperative diagnosis:  1.  Urge incontinence 2.  Incomplete bladder emptying 3.  History of multiple sclerosis  Postoperative diagnosis: 1.  Urge incontinence 2.  Incomplete bladder emptying 3.  History of multiple sclerosis  Procedure(s): 1.  Cystoscopy with Botox bladder injections (200 units)  Surgeon: Ellison Hughs, MD  Assistants: None  Anesthesia: 1 mg p.o. Xanax preoperatively  Complications: None  EBL: Less than 5 mL  Specimens: 1.  None  Drains/Catheters: 1.  None  Intraoperative findings:   1. 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  Indication:  Christy Cox is a 42 y.o. female with a history of multiple sclerosis (treated w/ stem cell transplant and chemo), recurrent UTIs, incomplete emptying and urinary urgency for the past 8 years (previously treated with Botox injections--failed multiple OAB medications)   From a urinary standpoint, she reports a weak FOS and feels like she isn't emptying her bladder well. She performs CIC 4-6 times per day without any issues or discomfort. After botox injections, she has occasional urgency/frequency, but is not bothered by it. Without Botox, she states that she has constant urgency/frequency as well as urge incontinence. Nocturia x 0-1. Denies interval UTIs, dysuria or hematuria. Failed Vesicare, Oxybutynin and Myrbetriq.    Description of procedure:  After informed consent was obtained, the patient was brought to the operating room and placed in the dorsolithotomy position.  A timeout was then performed.  A 21 French cystoscope with a injectable needle working element was then inserted into the urethra.  A complete bladder survey revealed cystitis cystica throughout the lateral mucosa with no other ulcers or intravesical pathology.  A total of 200 units of Botox diluted in 20 mL was injected in 0.5 mL increments in a grid like  fashion throughout the detrusor musculature.  There is no obvious bleeding following the injections.  Her bladder was then completely drained.  The patient was conscious during the procedure and tolerated it well.  Plan: Follow-up in 5 months to plan for repeat Botox injections

## 2017-09-26 NOTE — Anesthesia Postprocedure Evaluation (Signed)
Anesthesia Post Note  Patient: Christy Cox  Procedure(s) Performed: CYSTOSCOPY WITH INJECTION/ BOTOX (N/A )     Patient location during evaluation: PACU Anesthesia Type: MAC Level of consciousness: awake and alert Pain management: pain level controlled Vital Signs Assessment: post-procedure vital signs reviewed and stable Respiratory status: spontaneous breathing, nonlabored ventilation and respiratory function stable Cardiovascular status: stable and blood pressure returned to baseline Anesthetic complications: no    Last Vitals:  Vitals:   09/26/17 0850 09/26/17 1025  BP: 117/75 113/68  Pulse: 85 76  Resp: 18 18  Temp: 36.6 C 36.6 C  SpO2: 100% 100%    Last Pain:  Vitals:   09/26/17 1025  TempSrc: Oral  PainSc: 3                  Audry Pili

## 2017-09-27 ENCOUNTER — Encounter (HOSPITAL_BASED_OUTPATIENT_CLINIC_OR_DEPARTMENT_OTHER): Payer: Self-pay | Admitting: Urology

## 2017-10-08 ENCOUNTER — Encounter: Payer: Self-pay | Admitting: Family Medicine

## 2017-10-10 ENCOUNTER — Ambulatory Visit: Payer: BLUE CROSS/BLUE SHIELD | Admitting: Family Medicine

## 2017-10-11 ENCOUNTER — Ambulatory Visit (INDEPENDENT_AMBULATORY_CARE_PROVIDER_SITE_OTHER): Payer: BLUE CROSS/BLUE SHIELD | Admitting: Family Medicine

## 2017-10-11 ENCOUNTER — Encounter: Payer: Self-pay | Admitting: Family Medicine

## 2017-10-11 ENCOUNTER — Telehealth: Payer: Self-pay | Admitting: Family Medicine

## 2017-10-11 VITALS — BP 88/60 | HR 71 | Temp 97.6°F | Ht 67.0 in | Wt 271.4 lb

## 2017-10-11 DIAGNOSIS — N390 Urinary tract infection, site not specified: Secondary | ICD-10-CM | POA: Diagnosis not present

## 2017-10-11 DIAGNOSIS — B373 Candidiasis of vulva and vagina: Secondary | ICD-10-CM | POA: Diagnosis not present

## 2017-10-11 DIAGNOSIS — B3731 Acute candidiasis of vulva and vagina: Secondary | ICD-10-CM

## 2017-10-11 LAB — MICROSCOPIC EXAMINATION: Epithelial Cells (non renal): >10 /hpf — AB (ref 0–10)

## 2017-10-11 LAB — UA/M W/RFLX CULTURE, ROUTINE
Bilirubin, UA: NEGATIVE
Glucose, UA: NEGATIVE
Ketones, UA: NEGATIVE
Nitrite, UA: NEGATIVE
RBC, UA: NEGATIVE
Specific Gravity, UA: 1.025 (ref 1.005–1.030)
Urobilinogen, Ur: 0.2 mg/dL (ref 0.2–1.0)
pH, UA: 5.5 (ref 5.0–7.5)

## 2017-10-11 MED ORDER — SULFAMETHOXAZOLE-TRIMETHOPRIM 800-160 MG PO TABS: 1.0000 | ORAL_TABLET | Freq: Two times a day (BID) | ORAL | 0 refills | Status: DC

## 2017-10-11 MED ORDER — FLUCONAZOLE 150 MG PO TABS: 150.0000 mg | ORAL_TABLET | Freq: Once | ORAL | 0 refills | Status: AC

## 2017-10-11 NOTE — Progress Notes (Signed)
BP (!) 88/60 (BP Location: Right Arm, Patient Position: Sitting, Cuff Size: Normal)   Pulse 71   Temp 97.6 F (36.4 C) (Oral)   Ht 5\' 7"  (1.702 m)   Wt 271 lb 6.4 oz (123.1 kg)   SpO2 95%   BMI 42.51 kg/m    Subjective:    Patient ID: Christy Cox, female    DOB: [birth year], 42 y.o.   MRN: [id number]  HPI: Christy Cox is a 42 y.o. female  Chief Complaint  Patient presents with  . Cloudy Urine    Ongoing for 2 weeks  . Dysuria  . UTI Symptoms   Just had bladder botox injections 2 weeks ago which did not go very well per patient. Since then, has had dysuria, cloudy urine, cramping, weakness, falls. This is consistent for her with UTIs, her MS causes her to have falls when she's got infections going on. Denies fevers, chills, N/V, new back pain. Not trying anything OTC. Hx of recurrent UTIs and incontinence issues.   Relevant past medical, surgical, family and social history reviewed and updated as indicated. Interim medical history since our last visit reviewed. Allergies and medications reviewed and updated.  Review of Systems  Per HPI unless specifically indicated above     Objective:    BP (!) 88/60 (BP Location: Right Arm, Patient Position: Sitting, Cuff Size: Normal)   Pulse 71   Temp 97.6 F (36.4 C) (Oral)   Ht 5\' 7"  (1.702 m)   Wt 271 lb 6.4 oz (123.1 kg)   SpO2 95%   BMI 42.51 kg/m   Wt Readings from Last 3 Encounters:  10/11/17 271 lb 6.4 oz (123.1 kg)  09/26/17 273 lb 9.6 oz (124.1 kg)  06/07/17 269 lb 12.8 oz (122.4 kg)    Physical Exam  Constitutional: She is oriented to person, place, and time. She appears well-developed and well-nourished. No distress.  HENT:  Head: Atraumatic.  Eyes: Conjunctivae and EOM are normal.  Neck: Normal range of motion. Neck supple.  Cardiovascular: Normal rate and regular rhythm.  Pulmonary/Chest: Effort normal and breath sounds normal.  Musculoskeletal: Normal range of motion. She exhibits no  tenderness (No CVA tenderness to palpation).  Neurological: She is alert and oriented to person, place, and time.  Skin: Skin is warm and dry.  Psychiatric: She has a normal mood and affect. Her behavior is normal.  Nursing note and vitals reviewed.   Results for orders placed or performed in visit on 10/11/17  Microscopic Examination  Result Value Ref Range   WBC, UA 6-10 (A) 0 - 5 /hpf   RBC, UA 0-2 0 - 2 /hpf   Epithelial Cells (non renal) >10 (A) 0 - 10 /hpf   Mucus, UA Present Not Estab.   Bacteria, UA Few None seen/Few   Yeast, UA Present None seen  UA/M w/rflx Culture, Routine  Result Value Ref Range   Specific Gravity, UA 1.025 1.005 - 1.030   pH, UA 5.5 5.0 - 7.5   Color, UA Yellow Yellow   Appearance Ur Cloudy (A) Clear   Leukocytes, UA 1+ (A) Negative   Protein, UA Trace (A) Negative/Trace   Glucose, UA Negative Negative   Ketones, UA Negative Negative   RBC, UA Negative Negative   Bilirubin, UA Negative Negative   Urobilinogen, Ur 0.2 0.2 - 1.0 mg/dL   Nitrite, UA Negative Negative   Microscopic Examination See below:       Assessment & Plan:   Problem  List Items Addressed This Visit    None    Visit Diagnoses    Acute lower UTI    -  Primary   Tx with bactrim, await cx. Probiotics, push fluids. Follwed by Urology already   Relevant Medications   sulfamethoxazole-trimethoprim (BACTRIM DS,SEPTRA DS) 800-160 MG tablet   fluconazole (DIFLUCAN) 150 MG tablet   Other Relevant Orders   UA/M w/rflx Culture, Routine (Completed)   Urine Culture   Vaginal candidiasis       Tx with diflucan. Vaginal hygiene reviewed   Relevant Medications   sulfamethoxazole-trimethoprim (BACTRIM DS,SEPTRA DS) 800-160 MG tablet   fluconazole (DIFLUCAN) 150 MG tablet       Follow up plan: Return if symptoms worsen or fail to improve.

## 2017-10-11 NOTE — Patient Instructions (Signed)
Follow up as needed

## 2017-10-11 NOTE — Telephone Encounter (Signed)
ERROR

## 2017-10-13 LAB — URINE CULTURE

## 2017-10-16 DIAGNOSIS — R339 Retention of urine, unspecified: Secondary | ICD-10-CM | POA: Diagnosis not present

## 2017-10-23 ENCOUNTER — Other Ambulatory Visit: Payer: Self-pay | Admitting: Family Medicine

## 2017-10-23 ENCOUNTER — Encounter: Payer: Self-pay | Admitting: Family Medicine

## 2017-10-23 MED ORDER — CIPROFLOXACIN HCL 250 MG PO TABS: 250.0000 mg | ORAL_TABLET | Freq: Two times a day (BID) | ORAL | 0 refills | Status: DC

## 2017-10-23 NOTE — Telephone Encounter (Signed)
Please see the message from the patient, does she need to be seen again?

## 2017-10-25 ENCOUNTER — Ambulatory Visit: Payer: BLUE CROSS/BLUE SHIELD | Admitting: Family Medicine

## 2017-11-16 DIAGNOSIS — R339 Retention of urine, unspecified: Secondary | ICD-10-CM | POA: Diagnosis not present

## 2017-11-21 ENCOUNTER — Ambulatory Visit: Payer: BLUE CROSS/BLUE SHIELD | Admitting: Family Medicine

## 2017-12-06 ENCOUNTER — Encounter: Payer: Self-pay | Admitting: Family Medicine

## 2017-12-06 DIAGNOSIS — S46819A Strain of other muscles, fascia and tendons at shoulder and upper arm level, unspecified arm, initial encounter: Secondary | ICD-10-CM | POA: Insufficient documentation

## 2017-12-06 DIAGNOSIS — S46811A Strain of other muscles, fascia and tendons at shoulder and upper arm level, right arm, initial encounter: Secondary | ICD-10-CM | POA: Diagnosis not present

## 2017-12-06 DIAGNOSIS — R339 Retention of urine, unspecified: Secondary | ICD-10-CM | POA: Diagnosis not present

## 2017-12-29 DIAGNOSIS — R339 Retention of urine, unspecified: Secondary | ICD-10-CM | POA: Diagnosis not present

## 2018-01-23 DIAGNOSIS — R339 Retention of urine, unspecified: Secondary | ICD-10-CM | POA: Diagnosis not present

## 2018-01-31 DIAGNOSIS — M25369 Other instability, unspecified knee: Secondary | ICD-10-CM | POA: Diagnosis not present

## 2018-02-01 ENCOUNTER — Telehealth: Payer: Self-pay | Admitting: Family Medicine

## 2018-02-01 NOTE — Telephone Encounter (Signed)
Message relayed to patient. Verbalized understanding and denied questions. PT scheduled for tomorrow morning.

## 2018-02-01 NOTE — Telephone Encounter (Signed)
Needs OV.  

## 2018-02-01 NOTE — Telephone Encounter (Signed)
Pt declined AWV with [facility] stated she already does home visit with [facility] nurse and doesn't want two visits. notated appt desk notes

## 2018-02-01 NOTE — Telephone Encounter (Signed)
Keri, I spoke with Ms. Hamlett today to resched AWV with tiffany and she declined as she stated that [facility] send a nurse to her home and does not want to do this visit twice. She also wanted to ask whether she can just come in for a urinalysis for her UTIs that she gets frequently or does she need to see Apolonio Schneiders every time. She fell recently and hurt her knee and believes it to be a side effect of her UTI as she has had issues in the past. Could you please call her to discuss whether she can just come in for the urinalysis or does she need to also see Apolonio Schneiders?  Thank you, Jill Alexanders  [phone]

## 2018-02-02 ENCOUNTER — Encounter: Payer: Self-pay | Admitting: Family Medicine

## 2018-02-02 ENCOUNTER — Ambulatory Visit (INDEPENDENT_AMBULATORY_CARE_PROVIDER_SITE_OTHER): Payer: BLUE CROSS/BLUE SHIELD | Admitting: Family Medicine

## 2018-02-02 VITALS — BP 108/76 | HR 67 | Temp 98.4°F | Wt 256.7 lb

## 2018-02-02 DIAGNOSIS — N390 Urinary tract infection, site not specified: Secondary | ICD-10-CM

## 2018-02-02 DIAGNOSIS — Z87440 Personal history of urinary (tract) infections: Secondary | ICD-10-CM | POA: Diagnosis not present

## 2018-02-02 LAB — UA/M W/RFLX CULTURE, ROUTINE
Bilirubin, UA: NEGATIVE
Glucose, UA: NEGATIVE
Nitrite, UA: POSITIVE — AB
Specific Gravity, UA: 1.01 (ref 1.005–1.030)
Urobilinogen, Ur: 0.2 mg/dL (ref 0.2–1.0)
pH, UA: 5.5 (ref 5.0–7.5)

## 2018-02-02 LAB — MICROSCOPIC EXAMINATION

## 2018-02-02 MED ORDER — SULFAMETHOXAZOLE-TRIMETHOPRIM 800-160 MG PO TABS: 1.0000 | ORAL_TABLET | Freq: Two times a day (BID) | ORAL | 0 refills | Status: DC

## 2018-02-02 NOTE — Progress Notes (Signed)
BP 108/76   Pulse 67   Temp 98.4 F (36.9 C) (Oral)   Wt 256 lb 11.2 oz (116.4 kg)   SpO2 98%   BMI 40.20 kg/m    Subjective:    Patient ID: Christy Cox, female    DOB: Oct 07, 1975, 42 y.o.   MRN: [id number]  HPI: Christy Cox is a 42 y.o. female  Chief Complaint  Patient presents with  . Urinary Tract Infection   Here today with concerns about UTI. Has fallen 10-12 times in the last few weeks. Left leg weakness causing falls. 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 No urinary sxs, fevers, back pain, N/V abdominal pain. Not trying anything OTC.   Has lost 21 pounds so far on keto.   Relevant past medical, surgical, family and social history reviewed and updated as indicated. Interim medical history since our last visit reviewed. Allergies and medications reviewed and updated.  Review of Systems  Per HPI unless specifically indicated above     Objective:    BP 108/76   Pulse 67   Temp 98.4 F (36.9 C) (Oral)   Wt 256 lb 11.2 oz (116.4 kg)   SpO2 98%   BMI 40.20 kg/m   Wt Readings from Last 3 Encounters:  02/02/18 256 lb 11.2 oz (116.4 kg)  10/11/17 271 lb 6.4 oz (123.1 kg)  09/26/17 273 lb 9.6 oz (124.1 kg)    Physical Exam  Constitutional: She is oriented to person, place, and time. She appears well-developed and well-nourished. No distress.  HENT:  Head: Atraumatic.  Eyes: Conjunctivae and EOM are normal.  Neck: Normal range of motion. Neck supple.  Cardiovascular: Normal rate, regular rhythm and normal heart sounds.  Pulmonary/Chest: Effort normal and breath sounds normal.  Abdominal: Soft. Bowel sounds are normal. She exhibits no distension. There is no tenderness. There is no guarding.  Musculoskeletal: Normal range of motion.  Neurological: She is alert and oriented to person, place, and time.  Skin: Skin is warm and dry.    Psychiatric: She has a normal mood and affect. Her behavior is normal. Thought content normal.  Nursing note and vitals reviewed.   Results for orders placed or performed in visit on 02/02/18  Microscopic Examination  Result Value Ref Range   WBC, UA 11-30 (A) 0 - 5 /hpf   RBC, UA 0-2 0 - 2 /hpf   Epithelial Cells (non renal) 0-10 0 - 10 /hpf   Renal Epithel, UA 0-10 (A) None seen /hpf   Bacteria, UA Moderate (A) None seen/Few  UA/M w/rflx Culture, Routine  Result Value Ref Range   Specific Gravity, UA 1.010 1.005 - 1.030   pH, UA 5.5 5.0 - 7.5   Color, UA Yellow Yellow   Appearance Ur Cloudy (A) Clear   Leukocytes, UA 3+ (A) Negative   Protein, UA Trace (A) Negative/Trace   Glucose, UA Negative Negative   Ketones, UA Trace (A) Negative   RBC, UA 3+ (A) Negative   Bilirubin, UA Negative Negative   Urobilinogen, Ur 0.2 0.2 - 1.0 mg/dL   Nitrite, UA Positive (A) Negative   Microscopic Examination See below:       Assessment & Plan:   Problem List Items Addressed This Visit      Genitourinary   Recurrent UTI - Primary    U/A + today, will tx with bactrim and await  cx. Recommended she follow up with her Urologist as the infections have been frequent the past year. Due for her botox injections as well      Relevant Medications   sulfamethoxazole-trimethoprim (BACTRIM DS,SEPTRA DS) 800-160 MG tablet   Other Relevant Orders   UA/M w/rflx Culture, Routine (Completed)   Urine Culture       Follow up plan: Return for as scheduled.

## 2018-02-03 DIAGNOSIS — N390 Urinary tract infection, site not specified: Secondary | ICD-10-CM | POA: Insufficient documentation

## 2018-02-03 NOTE — Assessment & Plan Note (Signed)
U/A + today, will tx with bactrim and await cx. Recommended she follow up with her Urologist as the infections have been frequent the past year. Due for her botox injections as well

## 2018-02-06 ENCOUNTER — Other Ambulatory Visit: Payer: Self-pay | Admitting: Family Medicine

## 2018-02-06 LAB — URINE CULTURE

## 2018-02-06 MED ORDER — CIPROFLOXACIN HCL 250 MG PO TABS: 250.0000 mg | ORAL_TABLET | Freq: Two times a day (BID) | ORAL | 0 refills | Status: DC

## 2018-02-07 DIAGNOSIS — M25562 Pain in left knee: Secondary | ICD-10-CM | POA: Diagnosis not present

## 2018-02-19 DIAGNOSIS — M25562 Pain in left knee: Secondary | ICD-10-CM | POA: Diagnosis not present

## 2018-02-26 DIAGNOSIS — S83272A Complex tear of lateral meniscus, current injury, left knee, initial encounter: Secondary | ICD-10-CM | POA: Diagnosis not present

## 2018-02-26 DIAGNOSIS — M1712 Unilateral primary osteoarthritis, left knee: Secondary | ICD-10-CM | POA: Diagnosis not present

## 2018-03-02 ENCOUNTER — Ambulatory Visit (INDEPENDENT_AMBULATORY_CARE_PROVIDER_SITE_OTHER): Payer: BLUE CROSS/BLUE SHIELD | Admitting: Unknown Physician Specialty

## 2018-03-02 ENCOUNTER — Encounter: Payer: Self-pay | Admitting: Unknown Physician Specialty

## 2018-03-02 VITALS — BP 101/69 | HR 79 | Temp 98.3°F | Ht 67.0 in | Wt 250.4 lb

## 2018-03-02 DIAGNOSIS — R6889 Other general symptoms and signs: Secondary | ICD-10-CM

## 2018-03-02 DIAGNOSIS — J069 Acute upper respiratory infection, unspecified: Secondary | ICD-10-CM

## 2018-03-02 LAB — VERITOR FLU A/B WAIVED
Influenza A: NEGATIVE
Influenza B: NEGATIVE

## 2018-03-02 MED ORDER — AZITHROMYCIN 250 MG PO TABS: ORAL_TABLET | ORAL | 0 refills | Status: DC

## 2018-03-02 NOTE — Progress Notes (Signed)
BP 101/69 (BP Location: Left Arm, Patient Position: Sitting, Cuff Size: Large)   Pulse 79   Temp 98.3 F (36.8 C) (Oral)   Ht 5\' 7"  (1.702 m)   Wt 250 lb 6.4 oz (113.6 kg)   SpO2 98%   BMI 39.22 kg/m    Subjective:    Patient ID: Christy Cox, female    DOB: [birth year], 43 y.o.   MRN: [id number]  HPI: Christy Cox is a 43 y.o. female  Chief Complaint  Patient presents with  . Sore Throat    Ongoing 3 days.   . Generalized Body Aches  . Nasal Congestion  . Fatigue  . 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 Episode onset: 3 days. The problem has been gradually worsening. There has been no fever. Associated symptoms include congestion, rhinorrhea and a sore throat. Pertinent negatives include no abdominal pain, chest pain, coughing, diarrhea, dysuria, ear pain, headaches, joint swelling, neck pain, plugged ear sensation or swollen glands. She has tried nothing for the symptoms. The treatment provided no relief.    Relevant past medical, surgical, family and social history reviewed and updated as indicated. Interim medical history since our last visit reviewed. Allergies and medications reviewed and updated.  Review of Systems  HENT: Positive for congestion, rhinorrhea and sore throat. Negative for ear pain.   Respiratory: Negative for cough.   Cardiovascular: Negative for chest pain.  Gastrointestinal: Negative for abdominal pain and diarrhea.  Genitourinary: Negative for dysuria.  Musculoskeletal: Negative for neck pain.  Neurological: Negative for headaches.    Per HPI unless specifically indicated above     Objective:    BP 101/69 (BP Location: Left Arm, Patient Position: Sitting, Cuff Size: Large)   Pulse 79   Temp 98.3 F (36.8 C) (Oral)   Ht 5\' 7"  (1.702 m)   Wt 250 lb 6.4 oz (113.6 kg)   SpO2 98%   BMI 39.22 kg/m     Wt Readings from Last 3 Encounters:  03/02/18 250 lb 6.4 oz (113.6 kg)  02/02/18 256 lb 11.2 oz (116.4 kg)  10/11/17 271 lb 6.4 oz (123.1 kg)    Physical Exam Constitutional:      General: She is not in acute distress.    Appearance: Normal appearance. She is well-developed.  HENT:     Head: Normocephalic and atraumatic.     Right Ear: Tympanic membrane and ear canal normal.     Left Ear: Tympanic membrane and ear canal normal.     Nose: Rhinorrhea present.     Right Sinus: No maxillary sinus tenderness or frontal sinus tenderness.     Left Sinus: No maxillary sinus tenderness or frontal sinus tenderness.     Mouth/Throat:     Pharynx: Posterior oropharyngeal erythema present.  Eyes:     General: Lids are normal. No scleral icterus.       Right eye: No discharge.        Left eye: No discharge.     Conjunctiva/sclera: Conjunctivae normal.  Cardiovascular:     Rate and Rhythm: Normal rate and regular rhythm.  Pulmonary:     Effort: Pulmonary effort is normal. No respiratory distress.     Breath sounds: Normal breath sounds.  Abdominal:     Palpations: There is no hepatomegaly or splenomegaly.  Musculoskeletal: Normal range of motion.  Skin:  Coloration: Skin is not pale.     Findings: No rash.  Neurological:     Mental Status: She is alert and oriented to person, place, and time.  Psychiatric:        Behavior: Behavior normal.        Thought Content: Thought content normal.        Judgment: Judgment normal.     Results for orders placed or performed in visit on 02/02/18  Urine Culture  Result Value Ref Range   Urine Culture, Routine Final report (A)    Organism ID, Bacteria Escherichia coli (A)    ORGANISM ID, BACTERIA Klebsiella pneumoniae (A)    Antimicrobial Susceptibility Comment   Microscopic Examination  Result Value Ref Range   WBC, UA 11-30 (A) 0 - 5 /hpf   RBC, UA 0-2 0 - 2 /hpf   Epithelial Cells (non renal) 0-10 0 - 10 /hpf   Renal Epithel, UA 0-10  (A) None seen /hpf   Bacteria, UA Moderate (A) None seen/Few  UA/M w/rflx Culture, Routine  Result Value Ref Range   Specific Gravity, UA 1.010 1.005 - 1.030   pH, UA 5.5 5.0 - 7.5   Color, UA Yellow Yellow   Appearance Ur Cloudy (A) Clear   Leukocytes, UA 3+ (A) Negative   Protein, UA Trace (A) Negative/Trace   Glucose, UA Negative Negative   Ketones, UA Trace (A) Negative   RBC, UA 3+ (A) Negative   Bilirubin, UA Negative Negative   Urobilinogen, Ur 0.2 0.2 - 1.0 mg/dL   Nitrite, UA Positive (A) Negative   Microscopic Examination See below:       Assessment & Plan:   Problem List Items Addressed This Visit    None    Visit Diagnoses    Flu-like symptoms    -  Primary   Relevant Orders   Veritor Flu A/B Waived   Viral upper respiratory tract infection       Pt encouraged to wait 7-10 days before taking an antibiotic.  Will rx Zithromax to have on hand.  Supportive care with rext and fluids   Relevant Medications   azithromycin (ZITHROMAX Z-PAK) 250 MG tablet       Follow up plan: Return if symptoms worsen or fail to improve.

## 2018-03-05 DIAGNOSIS — M6281 Muscle weakness (generalized): Secondary | ICD-10-CM | POA: Diagnosis not present

## 2018-03-05 DIAGNOSIS — R262 Difficulty in walking, not elsewhere classified: Secondary | ICD-10-CM | POA: Diagnosis not present

## 2018-03-05 DIAGNOSIS — M25562 Pain in left knee: Secondary | ICD-10-CM | POA: Diagnosis not present

## 2018-03-12 DIAGNOSIS — R262 Difficulty in walking, not elsewhere classified: Secondary | ICD-10-CM | POA: Diagnosis not present

## 2018-03-12 DIAGNOSIS — M25562 Pain in left knee: Secondary | ICD-10-CM | POA: Diagnosis not present

## 2018-03-12 DIAGNOSIS — M6281 Muscle weakness (generalized): Secondary | ICD-10-CM | POA: Diagnosis not present

## 2018-03-14 DIAGNOSIS — R262 Difficulty in walking, not elsewhere classified: Secondary | ICD-10-CM | POA: Diagnosis not present

## 2018-03-14 DIAGNOSIS — M25562 Pain in left knee: Secondary | ICD-10-CM | POA: Diagnosis not present

## 2018-03-14 DIAGNOSIS — M6281 Muscle weakness (generalized): Secondary | ICD-10-CM | POA: Diagnosis not present

## 2018-03-15 DIAGNOSIS — N319 Neuromuscular dysfunction of bladder, unspecified: Secondary | ICD-10-CM | POA: Diagnosis not present

## 2018-03-15 DIAGNOSIS — R350 Frequency of micturition: Secondary | ICD-10-CM | POA: Diagnosis not present

## 2018-03-21 DIAGNOSIS — M25562 Pain in left knee: Secondary | ICD-10-CM | POA: Diagnosis not present

## 2018-03-21 DIAGNOSIS — R262 Difficulty in walking, not elsewhere classified: Secondary | ICD-10-CM | POA: Diagnosis not present

## 2018-03-21 DIAGNOSIS — M6281 Muscle weakness (generalized): Secondary | ICD-10-CM | POA: Diagnosis not present

## 2018-03-22 DIAGNOSIS — N319 Neuromuscular dysfunction of bladder, unspecified: Secondary | ICD-10-CM | POA: Diagnosis not present

## 2018-03-26 DIAGNOSIS — M25562 Pain in left knee: Secondary | ICD-10-CM | POA: Diagnosis not present

## 2018-03-26 DIAGNOSIS — R262 Difficulty in walking, not elsewhere classified: Secondary | ICD-10-CM | POA: Diagnosis not present

## 2018-03-26 DIAGNOSIS — M6281 Muscle weakness (generalized): Secondary | ICD-10-CM | POA: Diagnosis not present

## 2018-03-27 DIAGNOSIS — R339 Retention of urine, unspecified: Secondary | ICD-10-CM | POA: Diagnosis not present

## 2018-03-28 DIAGNOSIS — R262 Difficulty in walking, not elsewhere classified: Secondary | ICD-10-CM | POA: Diagnosis not present

## 2018-03-28 DIAGNOSIS — M25562 Pain in left knee: Secondary | ICD-10-CM | POA: Diagnosis not present

## 2018-03-28 DIAGNOSIS — M6281 Muscle weakness (generalized): Secondary | ICD-10-CM | POA: Diagnosis not present

## 2018-04-04 DIAGNOSIS — M6281 Muscle weakness (generalized): Secondary | ICD-10-CM | POA: Diagnosis not present

## 2018-04-04 DIAGNOSIS — R262 Difficulty in walking, not elsewhere classified: Secondary | ICD-10-CM | POA: Diagnosis not present

## 2018-04-04 DIAGNOSIS — M25562 Pain in left knee: Secondary | ICD-10-CM | POA: Diagnosis not present

## 2018-04-16 DIAGNOSIS — M6281 Muscle weakness (generalized): Secondary | ICD-10-CM | POA: Diagnosis not present

## 2018-04-16 DIAGNOSIS — R262 Difficulty in walking, not elsewhere classified: Secondary | ICD-10-CM | POA: Diagnosis not present

## 2018-04-16 DIAGNOSIS — M25562 Pain in left knee: Secondary | ICD-10-CM | POA: Diagnosis not present

## 2018-04-24 DIAGNOSIS — R262 Difficulty in walking, not elsewhere classified: Secondary | ICD-10-CM | POA: Diagnosis not present

## 2018-04-24 DIAGNOSIS — M6281 Muscle weakness (generalized): Secondary | ICD-10-CM | POA: Diagnosis not present

## 2018-05-02 DIAGNOSIS — R262 Difficulty in walking, not elsewhere classified: Secondary | ICD-10-CM | POA: Diagnosis not present

## 2018-05-02 DIAGNOSIS — M6281 Muscle weakness (generalized): Secondary | ICD-10-CM | POA: Diagnosis not present

## 2018-05-03 ENCOUNTER — Ambulatory Visit (INDEPENDENT_AMBULATORY_CARE_PROVIDER_SITE_OTHER): Payer: BLUE CROSS/BLUE SHIELD | Admitting: Family Medicine

## 2018-05-03 ENCOUNTER — Encounter: Payer: Self-pay | Admitting: Family Medicine

## 2018-05-03 ENCOUNTER — Other Ambulatory Visit: Payer: Self-pay

## 2018-05-03 VITALS — BP 101/69 | HR 67 | Temp 97.6°F | Ht 67.0 in | Wt 244.0 lb

## 2018-05-03 DIAGNOSIS — J069 Acute upper respiratory infection, unspecified: Secondary | ICD-10-CM

## 2018-05-03 MED ORDER — AMOXICILLIN 875 MG PO TABS: 875.0000 mg | ORAL_TABLET | Freq: Two times a day (BID) | ORAL | 0 refills | Status: DC

## 2018-05-03 NOTE — Progress Notes (Signed)
BP 101/69 (BP Location: Left Arm, Patient Position: Sitting, Cuff Size: Large)   Pulse 67   Temp 97.6 F (36.4 C) (Oral)   Ht 5\' 7"  (1.702 m)   Wt 244 lb (110.7 kg)   SpO2 98%   BMI 38.22 kg/m    Subjective:    Patient ID: Christy Cox, female    DOB: [birth year], 43 y.o.   MRN: [id number]  HPI: Christy Cox is a 43 y.o. female  Chief Complaint  Patient presents with  . 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 Sore Throat  . Generalized Body Aches    on and off for more than two weeks   Here today with over 2 weeks of sore throat, congestion, body aches, cough, fatigue. Took a zpak about 2 weeks ago which didn't help. Sxs ongoing. 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 No hx of known allergies or pulmonary dz, non smoker.   Relevant past medical, surgical, family and social history reviewed and updated as indicated. Interim medical history since our last visit reviewed. Allergies and medications reviewed and updated.  Review of Systems  Per HPI unless specifically indicated above     Objective:    BP 101/69 (BP Location: Left Arm, Patient Position: Sitting, Cuff Size: Large)   Pulse 67   Temp 97.6 F (36.4 C) (Oral)   Ht 5\' 7"  (1.702 m)   Wt 244 lb (110.7 kg)   SpO2 98%   BMI 38.22 kg/m   Wt Readings from Last 3 Encounters:  05/03/18 244 lb (110.7 kg)  03/02/18 250 lb 6.4 oz (113.6 kg)  02/02/18 256 lb 11.2 oz (116.4 kg)    Physical Exam Vitals signs and nursing note reviewed.  Constitutional:      Appearance: Normal appearance.  HENT:     Head: Atraumatic.     Right Ear: Tympanic membrane and external ear normal.     Left Ear: Tympanic membrane and external ear normal.     Nose: Congestion present.     Mouth/Throat:     Mouth: Mucous membranes are moist.     Pharynx:  Posterior oropharyngeal erythema present.  Eyes:     Extraocular Movements: Extraocular movements intact.     Conjunctiva/sclera: Conjunctivae normal.  Neck:     Musculoskeletal: Normal range of motion and neck supple.  Cardiovascular:     Rate and Rhythm: Normal rate and regular rhythm.     Heart sounds: Normal heart sounds.  Pulmonary:     Effort: Pulmonary effort is normal.     Breath sounds: Normal breath sounds. No wheezing or rales.  Musculoskeletal: Normal range of motion.  Skin:    General: Skin is warm and dry.  Neurological:     Mental Status: She is alert and oriented to person, place, and time.  Psychiatric:        Mood and Affect: Mood normal.        Thought Content: Thought content normal.     Results for orders placed or performed in visit on 03/02/18  Veritor Flu A/B Waived  Result Value Ref Range   Influenza A Negative Negative   Influenza B Negative Negative      Assessment & Plan:   Problem List Items Addressed This Visit  None    Visit Diagnoses    Upper respiratory tract infection, unspecified type    -  Primary   Continue supportive care and OTC remedies, amoxil sent if not improving. F/u if worsening or no better after that       Follow up plan: Return if symptoms worsen or fail to improve.

## 2018-05-11 ENCOUNTER — Ambulatory Visit: Payer: BLUE CROSS/BLUE SHIELD

## 2018-05-14 DIAGNOSIS — M25562 Pain in left knee: Secondary | ICD-10-CM | POA: Diagnosis not present

## 2018-05-14 DIAGNOSIS — M6281 Muscle weakness (generalized): Secondary | ICD-10-CM | POA: Diagnosis not present

## 2018-05-14 DIAGNOSIS — R262 Difficulty in walking, not elsewhere classified: Secondary | ICD-10-CM | POA: Diagnosis not present

## 2018-05-16 DIAGNOSIS — M25562 Pain in left knee: Secondary | ICD-10-CM | POA: Diagnosis not present

## 2018-05-16 DIAGNOSIS — R262 Difficulty in walking, not elsewhere classified: Secondary | ICD-10-CM | POA: Diagnosis not present

## 2018-05-16 DIAGNOSIS — M6281 Muscle weakness (generalized): Secondary | ICD-10-CM | POA: Diagnosis not present

## 2018-05-21 ENCOUNTER — Encounter: Payer: Self-pay | Admitting: Family Medicine

## 2018-05-21 ENCOUNTER — Other Ambulatory Visit: Payer: Self-pay | Admitting: Family Medicine

## 2018-05-21 MED ORDER — SULFAMETHOXAZOLE-TRIMETHOPRIM 800-160 MG PO TABS: 1.0000 | ORAL_TABLET | Freq: Two times a day (BID) | ORAL | 0 refills | Status: DC

## 2018-05-24 ENCOUNTER — Encounter: Payer: Self-pay | Admitting: Family Medicine

## 2018-05-24 DIAGNOSIS — R262 Difficulty in walking, not elsewhere classified: Secondary | ICD-10-CM | POA: Diagnosis not present

## 2018-05-24 DIAGNOSIS — M6281 Muscle weakness (generalized): Secondary | ICD-10-CM | POA: Diagnosis not present

## 2018-06-04 DIAGNOSIS — M25562 Pain in left knee: Secondary | ICD-10-CM | POA: Diagnosis not present

## 2018-06-04 DIAGNOSIS — M6281 Muscle weakness (generalized): Secondary | ICD-10-CM | POA: Diagnosis not present

## 2018-06-04 DIAGNOSIS — R262 Difficulty in walking, not elsewhere classified: Secondary | ICD-10-CM | POA: Diagnosis not present

## 2018-06-07 DIAGNOSIS — R262 Difficulty in walking, not elsewhere classified: Secondary | ICD-10-CM | POA: Diagnosis not present

## 2018-06-07 DIAGNOSIS — M6281 Muscle weakness (generalized): Secondary | ICD-10-CM | POA: Diagnosis not present

## 2018-06-12 DIAGNOSIS — R339 Retention of urine, unspecified: Secondary | ICD-10-CM | POA: Diagnosis not present

## 2018-06-14 DIAGNOSIS — M6281 Muscle weakness (generalized): Secondary | ICD-10-CM | POA: Diagnosis not present

## 2018-06-14 DIAGNOSIS — R262 Difficulty in walking, not elsewhere classified: Secondary | ICD-10-CM | POA: Diagnosis not present

## 2018-06-19 DIAGNOSIS — R262 Difficulty in walking, not elsewhere classified: Secondary | ICD-10-CM | POA: Diagnosis not present

## 2018-06-19 DIAGNOSIS — M6281 Muscle weakness (generalized): Secondary | ICD-10-CM | POA: Diagnosis not present

## 2018-06-26 DIAGNOSIS — R262 Difficulty in walking, not elsewhere classified: Secondary | ICD-10-CM | POA: Diagnosis not present

## 2018-06-26 DIAGNOSIS — M6281 Muscle weakness (generalized): Secondary | ICD-10-CM | POA: Diagnosis not present

## 2018-07-04 DIAGNOSIS — M6281 Muscle weakness (generalized): Secondary | ICD-10-CM | POA: Diagnosis not present

## 2018-07-04 DIAGNOSIS — M25562 Pain in left knee: Secondary | ICD-10-CM | POA: Diagnosis not present

## 2018-07-04 DIAGNOSIS — R262 Difficulty in walking, not elsewhere classified: Secondary | ICD-10-CM | POA: Diagnosis not present

## 2018-07-10 DIAGNOSIS — M6281 Muscle weakness (generalized): Secondary | ICD-10-CM | POA: Diagnosis not present

## 2018-07-10 DIAGNOSIS — R262 Difficulty in walking, not elsewhere classified: Secondary | ICD-10-CM | POA: Diagnosis not present

## 2018-07-11 ENCOUNTER — Encounter: Payer: Self-pay | Admitting: Family Medicine

## 2018-07-11 NOTE — Telephone Encounter (Signed)
Please get her scheduled for a virtual visit for UTI sxs

## 2018-07-13 ENCOUNTER — Other Ambulatory Visit: Payer: Self-pay

## 2018-07-13 ENCOUNTER — Telehealth (INDEPENDENT_AMBULATORY_CARE_PROVIDER_SITE_OTHER): Payer: BC Managed Care – PPO | Admitting: Family Medicine

## 2018-07-13 ENCOUNTER — Encounter: Payer: Self-pay | Admitting: Family Medicine

## 2018-07-13 VITALS — Ht 67.0 in | Wt 240.0 lb

## 2018-07-13 DIAGNOSIS — N390 Urinary tract infection, site not specified: Secondary | ICD-10-CM

## 2018-07-13 DIAGNOSIS — R399 Unspecified symptoms and signs involving the genitourinary system: Secondary | ICD-10-CM

## 2018-07-13 MED ORDER — SULFAMETHOXAZOLE-TRIMETHOPRIM 800-160 MG PO TABS: 1.0000 | ORAL_TABLET | Freq: Two times a day (BID) | ORAL | 0 refills | Status: DC

## 2018-07-13 NOTE — Assessment & Plan Note (Signed)
U/A micro showing "many" bacteria, awaiting culture. Will treat with bactrim while waiting given her hx and sxs consistent with her usual infections. Discussed following up with urology sooner than July if still consistently having these infections

## 2018-07-13 NOTE — Progress Notes (Signed)
Ht 5\' 7"  (1.702 m)   Wt 240 lb (108.9 kg)   BMI 37.59 kg/m    Subjective:    Patient ID: Christy Cox, female    DOB: Feb 25, 1976, 43 y.o.   MRN: [id number]  HPI: Christy Cox is a 43 y.o. female  Chief Complaint  Patient presents with  . Urinary Frequency    Ongoing approximately 2 weeks  . Nocturia  . Urine Discloration    . This visit was completed via telephone due to the restrictions of the COVID-19 pandemic. All issues as above were discussed and addressed but no physical exam was performed. If it was felt that the patient should be evaluated in the office, they were directed there. The patient verbally consented to this visit. Patient was unable to complete an audio/visual visit due to Technical difficulties,Lack of internet. Due to the catastrophic nature of the COVID-19 pandemic, this visit was done through audio contact only. . Location of the patient: home . Location of the provider: home . Those involved with this call:  . Provider: Merrie Roof, PA-C . CMA: Merilyn Baba, [place] . Front Desk/Registration: Jill Side  . Time spent on call: 20 minutes on the phone discussing health concerns. 5 minutes total spent in review of patient's record and preparation of their chart. I verified patient identity using two factors (patient name and date of birth). Patient consents verbally to being seen via telemedicine visit today.   Lower abdominal cramping, nocturia, and falls x 2 weeks. States this is always how her UTIs present. Gets them often due to MS related bladder issues. Followed by Urology. Has been getting botox bladder injections for years, not scheduled for botox injection. Used to be on daily preventative abx for her recurrent UTIs but has not been in over a year. Frequent infections the past 6 months or so. Denies fevers, chills, N/V, dysuria. Not trying anything OTC.   Relevant past medical, surgical, family and social history reviewed and updated  as indicated. Interim medical history since our last visit reviewed. Allergies and medications reviewed and updated.  Review of Systems  Per HPI unless specifically indicated above     Objective:    Ht 5\' 7"  (1.702 m)   Wt 240 lb (108.9 kg)   BMI 37.59 kg/m   Wt Readings from Last 3 Encounters:  07/13/18 240 lb (108.9 kg)  05/03/18 244 lb (110.7 kg)  03/02/18 250 lb 6.4 oz (113.6 kg)    Physical Exam  Unable to perform PE due to technical difficulties with video technology  Results for orders placed or performed in visit on 07/13/18  Microscopic Examination  Result Value Ref Range   WBC, UA 0-5 0 - 5 /hpf   RBC 0-2 0 - 2 /hpf   Epithelial Cells (non renal) 0-10 0 - 10 /hpf   Bacteria, UA Many (A) None seen/Few  Urine Culture, Reflex  Result Value Ref Range   Urine Culture, Routine WILL FOLLOW   UA/M w/rflx Culture, Routine  Result Value Ref Range   Specific Gravity, UA 1.020 1.005 - 1.030   pH, UA 5.0 5.0 - 7.5   Color, UA Yellow Yellow   Appearance Ur Clear Clear   Leukocytes,UA Negative Negative   Protein,UA Negative Negative/Trace   Glucose, UA Negative Negative   Ketones, UA Negative Negative   RBC, UA Trace (A) Negative   Bilirubin, UA Negative Negative   Urobilinogen, Ur 0.2 0.2 - 1.0 mg/dL   Nitrite, UA Negative  Negative   Microscopic Examination See below:    Urinalysis Reflex Comment       Assessment & Plan:   Problem List Items Addressed This Visit      Genitourinary   Recurrent UTI - Primary    U/A micro showing "many" bacteria, awaiting culture. Will treat with bactrim while waiting given her hx and sxs consistent with her usual infections. Discussed following up with urology sooner than July if still consistently having these infections      Relevant Medications   sulfamethoxazole-trimethoprim (BACTRIM DS) 800-160 MG tablet   Other Relevant Orders   UA/M w/rflx Culture, Routine (Completed)    Other Visit Diagnoses    UTI symptoms            Follow up plan: Return for as scheduled.

## 2018-07-16 LAB — UA/M W/RFLX CULTURE, ROUTINE
Bilirubin, UA: NEGATIVE
Glucose, UA: NEGATIVE
Ketones, UA: NEGATIVE
Leukocytes,UA: NEGATIVE
Nitrite, UA: NEGATIVE
Protein,UA: NEGATIVE
Specific Gravity, UA: 1.02 (ref 1.005–1.030)
Urobilinogen, Ur: 0.2 mg/dL (ref 0.2–1.0)
pH, UA: 5 (ref 5.0–7.5)

## 2018-07-16 LAB — MICROSCOPIC EXAMINATION

## 2018-07-16 LAB — URINE CULTURE, REFLEX

## 2018-07-17 DIAGNOSIS — R339 Retention of urine, unspecified: Secondary | ICD-10-CM | POA: Diagnosis not present

## 2018-08-08 ENCOUNTER — Encounter: Payer: Self-pay | Admitting: Family Medicine

## 2018-08-10 ENCOUNTER — Encounter: Payer: Self-pay | Admitting: Family Medicine

## 2018-08-10 ENCOUNTER — Ambulatory Visit (INDEPENDENT_AMBULATORY_CARE_PROVIDER_SITE_OTHER): Payer: BC Managed Care – PPO | Admitting: Family Medicine

## 2018-08-10 ENCOUNTER — Other Ambulatory Visit: Payer: Self-pay

## 2018-08-10 DIAGNOSIS — R062 Wheezing: Secondary | ICD-10-CM

## 2018-08-10 MED ORDER — BUDESONIDE-FORMOTEROL FUMARATE 160-4.5 MCG/ACT IN AERO: 2.0000 | INHALATION_SPRAY | Freq: Two times a day (BID) | RESPIRATORY_TRACT | 3 refills | Status: DC

## 2018-08-10 MED ORDER — MONTELUKAST SODIUM 10 MG PO TABS: 10.0000 mg | ORAL_TABLET | Freq: Every day | ORAL | 3 refills | Status: DC

## 2018-08-10 NOTE — Progress Notes (Signed)
There were no vitals taken for this visit.   Subjective:    Patient ID: Christy Cox, female    DOB: 1975/04/12, 43 y.o.   MRN: [id number]  HPI: Christy Cox is a 43 y.o. female  Chief Complaint  Patient presents with  . Cough    x 1.5; dry.   . Wheezing    Constantly; Had RX for symbicort    . This visit was completed via WebEx due to the restrictions of the COVID-19 pandemic. All issues as above were discussed and addressed. Physical exam was done as above through visual confirmation on WebEx. If it was felt that the patient should be evaluated in the office, they were directed there. The patient verbally consented to this visit. . Location of the patient: home . Location of the provider: work . Those involved with this call:  . Provider: Merrie Roof, PA-C . CMA: Gerda Diss, CMA . Front Desk/Registration: Jill Side  . Time spent on call: 15 minutes with patient face to face via video conference. More than 50% of this time was spent in counseling and coordination of care. 5 minutes total spent in review of patient's record and preparation of their chart. I verified patient identity using two factors (patient name and date of birth). Patient consents verbally to being seen via telemedicine visit today.   Presenting today with about 2 weeks of dry cough and wheezing. Denies fevers, congestion, SOB, sore throat, sick contacts, recent travel. Chest muscles are sore from coughing so hard. Had some leftover symbicort from the last episode such as this she dealt with which was helping but she's run out. Also using dayquil and tessalon which do help. Hx of allergic rhinitis not currently on medications.   Relevant past medical, surgical, family and social history reviewed and updated as indicated. Interim medical history since our last visit reviewed. Allergies and medications reviewed and updated.  Review of Systems  Per HPI unless specifically indicated above      Objective:    There were no vitals taken for this visit.  Wt Readings from Last 3 Encounters:  07/13/18 240 lb (108.9 kg)  05/03/18 244 lb (110.7 kg)  03/02/18 250 lb 6.4 oz (113.6 kg)    Physical Exam Vitals signs and nursing note reviewed.  Constitutional:      General: She is not in acute distress.    Appearance: Normal appearance.  HENT:     Head: Atraumatic.     Right Ear: External ear normal.     Left Ear: External ear normal.     Nose: Nose normal. No congestion.     Mouth/Throat:     Mouth: Mucous membranes are moist.     Pharynx: Oropharynx is clear. Posterior oropharyngeal erythema (posteriorly) present.  Eyes:     Extraocular Movements: Extraocular movements intact.     Conjunctiva/sclera: Conjunctivae normal.  Neck:     Musculoskeletal: Normal range of motion.  Cardiovascular:     Comments: Unable to assess via virtual visit Pulmonary:     Effort: Pulmonary effort is normal. No respiratory distress.  Musculoskeletal: Normal range of motion.  Skin:    General: Skin is dry.     Findings: No erythema.  Neurological:     Mental Status: She is alert and oriented to person, place, and time.  Psychiatric:        Mood and Affect: Mood normal.        Thought Content: Thought content normal.  Judgment: Judgment normal.     Results for orders placed or performed in visit on 07/13/18  Microscopic Examination   URINE  Result Value Ref Range   WBC, UA 0-5 0 - 5 /hpf   RBC 0-2 0 - 2 /hpf   Epithelial Cells (non renal) 0-10 0 - 10 /hpf   Bacteria, UA Many (A) None seen/Few  Urine Culture, Reflex   URINE  Result Value Ref Range   Urine Culture, Routine Final report    Organism ID, Bacteria Comment   UA/M w/rflx Culture, Routine   Specimen: Urine   URINE  Result Value Ref Range   Specific Gravity, UA 1.020 1.005 - 1.030   pH, UA 5.0 5.0 - 7.5   Color, UA Yellow Yellow   Appearance Ur Clear Clear   Leukocytes,UA Negative Negative   Protein,UA Negative  Negative/Trace   Glucose, UA Negative Negative   Ketones, UA Negative Negative   RBC, UA Trace (A) Negative   Bilirubin, UA Negative Negative   Urobilinogen, Ur 0.2 0.2 - 1.0 mg/dL   Nitrite, UA Negative Negative   Microscopic Examination See below:    Urinalysis Reflex Comment       Assessment & Plan:   Problem List Items Addressed This Visit    None    Visit Diagnoses    Wheezing    -  Primary   Suspect reactive airway flare, refill symbicort, start singulair. Continue tessalon prn. CXR if worsening or not improving   Relevant Orders   DG Chest 2 View       Follow up plan: Return for as scheduled.

## 2018-08-13 ENCOUNTER — Other Ambulatory Visit: Payer: Self-pay | Admitting: Family Medicine

## 2018-08-13 ENCOUNTER — Encounter: Payer: Self-pay | Admitting: Family Medicine

## 2018-08-13 MED ORDER — AMOXICILLIN-POT CLAVULANATE 875-125 MG PO TABS: 1.0000 | ORAL_TABLET | Freq: Two times a day (BID) | ORAL | 0 refills | Status: DC

## 2018-08-20 ENCOUNTER — Encounter: Payer: Self-pay | Admitting: Family Medicine

## 2018-08-21 ENCOUNTER — Ambulatory Visit
Admission: RE | Admit: 2018-08-21 | Discharge: 2018-08-21 | Disposition: A | Discharge status: Home / Self Care | Payer: BC Managed Care – PPO | Source: Ambulatory Visit | Attending: Family Medicine | Admitting: Family Medicine

## 2018-08-21 ENCOUNTER — Other Ambulatory Visit: Payer: Self-pay

## 2018-08-21 DIAGNOSIS — R062 Wheezing: Secondary | ICD-10-CM | POA: Insufficient documentation

## 2018-08-21 DIAGNOSIS — R05 Cough: Secondary | ICD-10-CM | POA: Diagnosis not present

## 2018-08-22 ENCOUNTER — Telehealth: Payer: Self-pay | Admitting: Family Medicine

## 2018-08-22 MED ORDER — DOXYCYCLINE HYCLATE 100 MG PO TABS: 100.0000 mg | ORAL_TABLET | Freq: Two times a day (BID) | ORAL | 0 refills | Status: DC

## 2018-08-22 NOTE — Telephone Encounter (Signed)
Just released results via mychart, ok to call and tell her those and to let her know I sent over another course of abx  Copied from [place] [phone]. Topic: General - Other >> Aug 22, 2018 10:01 AM Ivar Drape wrote: Reason for CRM:  Patient would like the results of the chest Xray she had yesterday.

## 2018-08-22 NOTE — Telephone Encounter (Signed)
Message relayed to patient. Verbalized understanding and denied questions.   

## 2018-08-23 DIAGNOSIS — R339 Retention of urine, unspecified: Secondary | ICD-10-CM | POA: Diagnosis not present

## 2018-09-12 DIAGNOSIS — N390 Urinary tract infection, site not specified: Secondary | ICD-10-CM | POA: Diagnosis not present

## 2018-09-14 DIAGNOSIS — R339 Retention of urine, unspecified: Secondary | ICD-10-CM | POA: Diagnosis not present

## 2018-09-18 ENCOUNTER — Encounter: Payer: Self-pay | Admitting: Family Medicine

## 2018-09-21 ENCOUNTER — Ambulatory Visit: Payer: Medicare Other | Admitting: Family Medicine

## 2018-09-21 ENCOUNTER — Encounter: Payer: Self-pay | Admitting: Family Medicine

## 2018-09-21 ENCOUNTER — Ambulatory Visit (INDEPENDENT_AMBULATORY_CARE_PROVIDER_SITE_OTHER): Payer: BC Managed Care – PPO | Admitting: Family Medicine

## 2018-09-21 ENCOUNTER — Other Ambulatory Visit: Payer: Self-pay

## 2018-09-21 VITALS — BP 110/77 | HR 80 | Temp 97.8°F | Ht 67.0 in | Wt 250.0 lb

## 2018-09-21 DIAGNOSIS — G35 Multiple sclerosis: Secondary | ICD-10-CM

## 2018-09-21 NOTE — Progress Notes (Signed)
BP 110/77   Pulse 80   Temp 97.8 F (36.6 C) (Oral)   Ht 5\' 7"  (1.702 m)   Wt 250 lb (113.4 kg)   SpO2 98%   BMI 39.16 kg/m    Subjective:    Patient ID: Christy Cox, female    DOB: 08/29/1975, 43 y.o.   MRN: [id number]  HPI: Christy Cox is a 43 y.o. female  Chief Complaint  Patient presents with  . Follow-up    paperwork for work   Patient here today with forms for work regarding some accommodations due to her MS. 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 She would also like to discuss a handicap placard because walking long distances is very difficult for her and she fears falling.   Relevant past medical, surgical, family and social history reviewed and updated as indicated. Interim medical history since our last visit reviewed. Allergies and medications reviewed and updated.  Review of Systems  Per HPI unless specifically indicated above     Objective:    BP 110/77   Pulse 80   Temp 97.8 F (36.6 C) (Oral)   Ht 5\' 7"  (1.702 m)   Wt 250 lb (113.4 kg)   SpO2 98%   BMI 39.16 kg/m   Wt Readings from Last 3 Encounters:  09/21/18 250 lb (113.4 kg)  07/13/18 240 lb (108.9 kg)  05/03/18 244 lb (110.7 kg)    Physical Exam Vitals signs and nursing note reviewed.  Constitutional:      Appearance: Normal appearance. She is not ill-appearing.  HENT:     Head: Atraumatic.  Eyes:     Extraocular Movements: Extraocular movements intact.     Conjunctiva/sclera: Conjunctivae normal.  Neck:     Musculoskeletal: Normal range of motion and neck supple.  Cardiovascular:     Rate and Rhythm: Normal rate and regular rhythm.     Heart sounds: Normal heart sounds.  Pulmonary:     Effort: Pulmonary effort is normal.     Breath sounds: Normal breath sounds.   Musculoskeletal: Normal range of motion.  Skin:    General: Skin is warm and dry.  Neurological:     Mental Status: She is alert and oriented to person, place, and time.  Psychiatric:        Mood and Affect: Mood normal.        Thought Content: Thought content normal.        Judgment: Judgment normal.     Results for orders placed or performed in visit on 07/13/18  Microscopic Examination   URINE  Result Value Ref Range   WBC, UA 0-5 0 - 5 /hpf   RBC 0-2 0 - 2 /hpf   Epithelial Cells (non renal) 0-10 0 - 10 /hpf   Bacteria, UA Many (A) None seen/Few  Urine Culture, Reflex   URINE  Result Value Ref Range   Urine Culture, Routine Final report    Organism ID, Bacteria Comment   UA/M w/rflx Culture, Routine   Specimen: Urine   URINE  Result Value Ref Range   Specific Gravity, UA 1.020 1.005 - 1.030   pH, UA 5.0 5.0 - 7.5   Color, UA Yellow Yellow   Appearance Ur Clear Clear   Leukocytes,UA Negative Negative  Protein,UA Negative Negative/Trace   Glucose, UA Negative Negative   Ketones, UA Negative Negative   RBC, UA Trace (A) Negative   Bilirubin, UA Negative Negative   Urobilinogen, Ur 0.2 0.2 - 1.0 mg/dL   Nitrite, UA Negative Negative   Microscopic Examination See below:    Urinalysis Reflex Comment       Assessment & Plan:   Problem List Items Addressed This Visit      Nervous and Auditory   Multiple sclerosis ([place]) - Primary (Chronic)    Will complete forms to allot 4 hours weekly of intermittent excused absence due to complications related to her MS. Will also provide handicap sticker due to leg weakness          Follow up plan: Return if symptoms worsen or fail to improve.

## 2018-09-24 NOTE — Assessment & Plan Note (Addendum)
Will complete forms to allot 4 hours weekly of intermittent excused absence due to complications related to her MS. Will also provide handicap sticker due to leg weakness

## 2018-10-25 DIAGNOSIS — R339 Retention of urine, unspecified: Secondary | ICD-10-CM | POA: Diagnosis not present

## 2018-11-13 ENCOUNTER — Encounter: Payer: Self-pay | Admitting: Family Medicine

## 2018-11-21 ENCOUNTER — Other Ambulatory Visit: Payer: Self-pay

## 2018-11-21 ENCOUNTER — Ambulatory Visit (INDEPENDENT_AMBULATORY_CARE_PROVIDER_SITE_OTHER): Payer: BC Managed Care – PPO | Admitting: Family Medicine

## 2018-11-21 ENCOUNTER — Encounter: Payer: Self-pay | Admitting: Family Medicine

## 2018-11-21 VITALS — BP 87/60 | HR 74 | Temp 97.8°F | Ht 67.0 in | Wt 250.0 lb

## 2018-11-21 DIAGNOSIS — N390 Urinary tract infection, site not specified: Secondary | ICD-10-CM | POA: Diagnosis not present

## 2018-11-21 DIAGNOSIS — G4762 Sleep related leg cramps: Secondary | ICD-10-CM | POA: Diagnosis not present

## 2018-11-21 MED ORDER — SULFAMETHOXAZOLE-TRIMETHOPRIM 800-160 MG PO TABS: 1.0000 | ORAL_TABLET | Freq: Two times a day (BID) | ORAL | 0 refills | Status: DC

## 2018-11-21 MED ORDER — GABAPENTIN 300 MG PO CAPS: 300.0000 mg | ORAL_CAPSULE | Freq: Every day | ORAL | 2 refills | Status: DC

## 2018-11-21 NOTE — Progress Notes (Signed)
BP (!) 87/60 (BP Location: Left Arm, Patient Position: Sitting, Cuff Size: Normal)   Pulse 74   Temp 97.8 F (36.6 C) (Oral)   Ht 5\' 7"  (1.702 m)   Wt 250 lb (113.4 kg)   LMP 07/30/2015 (Approximate)   SpO2 100%   BMI 39.16 kg/m    Subjective:    Patient ID: Christy Cox, female    DOB: [birth year], 43 y.o.   MRN: [id number]  HPI: Christy Cox is a 43 y.o. female  Chief Complaint  Patient presents with  . Leg Cramps    Patient having terrible leg cramps at night. Ongoing several months.   Marland Kitchen UTI Symptoms    Urine odor and discoloration.    Patient presenting today for urinary odor and darkness. 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 This is followed by Urology and she receives botox injections 2x/year which help a lot. Does not get typical sxs from UTIs due to her neuropathy so no dysuria, hematuria, abdominal pain, N/V/D. Not trying anything OTC for sxs.  Dealing with worsening b/l LE cramps overnight. Feel like charlie horses but go all over legs at different points. Has tried numerous solutions without relief. Uses heated blanket and hot compresses, stretches, walking without relief. Happens most nights. Does have a hx of neuropathy though does not feel this discomfort as pins and needles or burning pain. Denies redness, point tenderness, injury.   Relevant past medical, surgical, family and social history reviewed and updated as indicated. Interim medical history since our last visit reviewed. Allergies and medications reviewed and updated.  Review of Systems  Per HPI unless specifically indicated above     Objective:    BP (!) 87/60 (BP Location: Left Arm, Patient Position: Sitting, Cuff Size: Normal)   Pulse 74   Temp 97.8 F (36.6 C) (Oral)   Ht 5\' 7"  (1.702 m)   Wt 250 lb (113.4 kg)   LMP 07/30/2015 (Approximate)   SpO2 100%   BMI 39.16 kg/m   Wt Readings from Last 3 Encounters:  11/21/18 250 lb  (113.4 kg)  09/21/18 250 lb (113.4 kg)  07/13/18 240 lb (108.9 kg)    Physical Exam Vitals signs and nursing note reviewed.  Constitutional:      Appearance: Normal appearance. She is not ill-appearing.  HENT:     Head: Atraumatic.  Eyes:     Extraocular Movements: Extraocular movements intact.     Conjunctiva/sclera: Conjunctivae normal.  Neck:     Musculoskeletal: Normal range of motion and neck supple.  Cardiovascular:     Rate and Rhythm: Normal rate and regular rhythm.     Pulses: Normal pulses.     Heart sounds: Normal heart sounds.  Pulmonary:     Effort: Pulmonary effort is normal.     Breath sounds: Normal breath sounds.  Abdominal:     General: Bowel sounds are normal.     Palpations: Abdomen is soft.     Tenderness: There is no abdominal tenderness. There is no right CVA tenderness, left CVA tenderness or guarding.  Musculoskeletal: Normal range of motion.        General: No swelling or tenderness.     Comments: - homan's sign and squeeze test b/l LEs  Skin:    General: Skin is warm and dry.     Findings: No erythema.  Neurological:     Mental Status: She is alert and oriented to person, place, and  time.  Psychiatric:        Mood and Affect: Mood normal.        Thought Content: Thought content normal.        Judgment: Judgment normal.     Results for orders placed or performed in visit on 07/13/18  Microscopic Examination   URINE  Result Value Ref Range   WBC, UA 0-5 0 - 5 /hpf   RBC 0-2 0 - 2 /hpf   Epithelial Cells (non renal) 0-10 0 - 10 /hpf   Bacteria, UA Many (A) None seen/Few  Urine Culture, Reflex   URINE  Result Value Ref Range   Urine Culture, Routine Final report    Organism ID, Bacteria Comment   UA/M w/rflx Culture, Routine   Specimen: Urine   URINE  Result Value Ref Range   Specific Gravity, UA 1.020 1.005 - 1.030   pH, UA 5.0 5.0 - 7.5   Color, UA Yellow Yellow   Appearance Ur Clear Clear   Leukocytes,UA Negative Negative    Protein,UA Negative Negative/Trace   Glucose, UA Negative Negative   Ketones, UA Negative Negative   RBC, UA Trace (A) Negative   Bilirubin, UA Negative Negative   Urobilinogen, Ur 0.2 0.2 - 1.0 mg/dL   Nitrite, UA Negative Negative   Microscopic Examination See below:    Urinalysis Reflex Comment       Assessment & Plan:   Problem List Items Addressed This Visit    None    Visit Diagnoses    Acute lower UTI    -  Primary   Tx with bactrim, await urine culture. Push fluids, probiotics. F/u if sxs not improving. Continue following with Urologist   Relevant Medications   sulfamethoxazole-trimethoprim (BACTRIM DS) 800-160 MG tablet   Other Relevant Orders   UA/M w/rflx Culture, Routine   Nocturnal leg cramps       Likely neuropathic. Trial gabapentin in addition to leg cramp supplements and continued supportive care with fluids, stretches, heat       Follow up plan: Return for as scheduled.

## 2018-11-23 LAB — UA/M W/RFLX CULTURE, ROUTINE
Bilirubin, UA: NEGATIVE
Glucose, UA: NEGATIVE
Ketones, UA: NEGATIVE
Nitrite, UA: POSITIVE — AB
Protein,UA: NEGATIVE
RBC, UA: NEGATIVE
Specific Gravity, UA: 1.015 (ref 1.005–1.030)
Urobilinogen, Ur: 0.2 mg/dL (ref 0.2–1.0)
pH, UA: 7 (ref 5.0–7.5)

## 2018-11-23 LAB — MICROSCOPIC EXAMINATION: RBC, Urine: NONE SEEN /hpf (ref 0–2)

## 2018-11-23 LAB — URINE CULTURE, REFLEX

## 2018-11-29 DIAGNOSIS — R339 Retention of urine, unspecified: Secondary | ICD-10-CM | POA: Diagnosis not present

## 2018-12-03 DIAGNOSIS — R300 Dysuria: Secondary | ICD-10-CM | POA: Diagnosis not present

## 2018-12-10 NOTE — Telephone Encounter (Signed)
Needs appt

## 2018-12-17 ENCOUNTER — Encounter: Payer: Self-pay | Admitting: Family Medicine

## 2018-12-17 ENCOUNTER — Ambulatory Visit (INDEPENDENT_AMBULATORY_CARE_PROVIDER_SITE_OTHER): Payer: BC Managed Care – PPO | Admitting: Family Medicine

## 2018-12-17 ENCOUNTER — Other Ambulatory Visit: Payer: Self-pay

## 2018-12-17 VITALS — Wt 250.0 lb

## 2018-12-17 DIAGNOSIS — Z6839 Body mass index (BMI) 39.0-39.9, adult: Secondary | ICD-10-CM

## 2018-12-17 DIAGNOSIS — E6609 Other obesity due to excess calories: Secondary | ICD-10-CM

## 2018-12-17 MED ORDER — SAXENDA 18 MG/3ML ~~LOC~~ SOPN: PEN_INJECTOR | SUBCUTANEOUS | 0 refills | Status: AC

## 2018-12-17 MED ORDER — PEN NEEDLES 32G X 6 MM MISC: 1.0000 [IU] | Freq: Every day | 0 refills | Status: DC

## 2018-12-17 NOTE — Assessment & Plan Note (Signed)
Will trial saxenda in addition to diet plans and increasing activity as tolerated. F/u in 1 month for recheck. Risks and precautions reviewed

## 2018-12-17 NOTE — Progress Notes (Signed)
Wt 250 lb (113.4 kg)   LMP 07/30/2015 (Approximate)   BMI 39.16 kg/m    Subjective:    Patient ID: Christy Cox, female    DOB: March 20, 1975, 43 y.o.   MRN: [id number]  HPI: Christy Cox is a 43 y.o. female  Chief Complaint  Patient presents with  . Obesity    Patient would like to start on a Saxenda    . This visit was completed via WebEx due to the restrictions of the COVID-19 pandemic. All issues as above were discussed and addressed. Physical exam was done as above through visual confirmation on WebEx. If it was felt that the patient should be evaluated in the office, they were directed there. The patient verbally consented to this visit. . Location of the patient: home . Location of the provider: work . Those involved with this call:  . Provider: Merrie Roof, PA-C . CMA: Tiffany Reel, CMA . Front Desk/Registration: Jill Side  . Time spent on call: 15 minutes with patient face to face via video conference. More than 50% of this time was spent in counseling and coordination of care. 5 minutes total spent in review of patient's record and preparation of their chart. I verified patient identity using two factors (patient name and date of birth). Patient consents verbally to being seen via telemedicine visit today.   Patient presenting today to discuss weight loss. Has tried belviq and phentermine in the past and is currently on keto which previously had worked well. Unable to exercise the way she would like due to leg weakness and coordination issues related to her MS. Interested in [place].   Relevant past medical, surgical, family and social history reviewed and updated as indicated. Interim medical history since our last visit reviewed. Allergies and medications reviewed and updated.  Review of Systems  Per HPI unless specifically indicated above     Objective:    Wt 250 lb (113.4 kg)   LMP 07/30/2015 (Approximate)   BMI 39.16 kg/m   Wt Readings  from Last 3 Encounters:  12/17/18 250 lb (113.4 kg)  11/21/18 250 lb (113.4 kg)  09/21/18 250 lb (113.4 kg)    Physical Exam Vitals signs and nursing note reviewed.  Constitutional:      General: She is not in acute distress.    Appearance: Normal appearance.  HENT:     Head: Atraumatic.     Right Ear: External ear normal.     Left Ear: External ear normal.     Nose: Nose normal. No congestion.     Mouth/Throat:     Mouth: Mucous membranes are moist.     Pharynx: Oropharynx is clear. No posterior oropharyngeal erythema.  Eyes:     Extraocular Movements: Extraocular movements intact.     Conjunctiva/sclera: Conjunctivae normal.  Neck:     Musculoskeletal: Normal range of motion.  Cardiovascular:     Comments: Unable to assess via virtual visit Pulmonary:     Effort: Pulmonary effort is normal. No respiratory distress.  Musculoskeletal: Normal range of motion.  Skin:    General: Skin is dry.     Findings: No erythema.  Neurological:     Mental Status: She is alert and oriented to person, place, and time.  Psychiatric:        Mood and Affect: Mood normal.        Thought Content: Thought content normal.        Judgment: Judgment normal.     Results  for orders placed or performed in visit on 11/21/18  Microscopic Examination   URINE  Result Value Ref Range   WBC, UA 6-10 (A) 0 - 5 /hpf   RBC None seen 0 - 2 /hpf   Epithelial Cells (non renal) 0-10 0 - 10 /hpf   Bacteria, UA Many (A) None seen/Few  Urine Culture, Reflex   URINE  Result Value Ref Range   Urine Culture, Routine Final report (A)    Organism ID, Bacteria Escherichia coli (A)    Antimicrobial Susceptibility Comment   UA/M w/rflx Culture, Routine   Specimen: Urine   URINE  Result Value Ref Range   Specific Gravity, UA 1.015 1.005 - 1.030   pH, UA 7.0 5.0 - 7.5   Color, UA Yellow Yellow   Appearance Ur Clear Clear   Leukocytes,UA Trace (A) Negative   Protein,UA Negative Negative/Trace   Glucose, UA  Negative Negative   Ketones, UA Negative Negative   RBC, UA Negative Negative   Bilirubin, UA Negative Negative   Urobilinogen, Ur 0.2 0.2 - 1.0 mg/dL   Nitrite, UA Positive (A) Negative   Microscopic Examination See below:    Urinalysis Reflex Comment       Assessment & Plan:   Problem List Items Addressed This Visit      Other   Obesity - Primary    Will trial saxenda in addition to diet plans and increasing activity as tolerated. F/u in 1 month for recheck. Risks and precautions reviewed      Relevant Medications   Liraglutide -Weight Management (SAXENDA) 18 MG/3ML SOPN   Other Relevant Orders   Comprehensive metabolic panel       Follow up plan: Return in about 4 weeks (around 01/14/2019) for weight check - pt will call to schedule.

## 2019-01-02 ENCOUNTER — Ambulatory Visit (INDEPENDENT_AMBULATORY_CARE_PROVIDER_SITE_OTHER): Payer: BC Managed Care – PPO | Admitting: Family Medicine

## 2019-01-02 ENCOUNTER — Other Ambulatory Visit: Payer: Self-pay

## 2019-01-02 ENCOUNTER — Encounter: Payer: Self-pay | Admitting: Family Medicine

## 2019-01-02 VITALS — BP 89/61 | HR 74 | Temp 97.8°F

## 2019-01-02 DIAGNOSIS — Z0184 Encounter for antibody response examination: Secondary | ICD-10-CM

## 2019-01-02 DIAGNOSIS — R1030 Lower abdominal pain, unspecified: Secondary | ICD-10-CM | POA: Diagnosis not present

## 2019-01-02 DIAGNOSIS — E6609 Other obesity due to excess calories: Secondary | ICD-10-CM

## 2019-01-02 DIAGNOSIS — Z6839 Body mass index (BMI) 39.0-39.9, adult: Secondary | ICD-10-CM

## 2019-01-02 DIAGNOSIS — R197 Diarrhea, unspecified: Secondary | ICD-10-CM

## 2019-01-02 MED ORDER — SULFAMETHOXAZOLE-TRIMETHOPRIM 800-160 MG PO TABS: 1.0000 | ORAL_TABLET | Freq: Two times a day (BID) | ORAL | 0 refills | Status: DC

## 2019-01-02 NOTE — Assessment & Plan Note (Signed)
Has not yet started the saxenda due to these sxs, will hold off until feeling in her usual state of health and then start it. Knows to f/u 1 month into taking it

## 2019-01-02 NOTE — Progress Notes (Signed)
BP (!) 89/61   Pulse 74   Temp 97.8 F (36.6 C) (Oral)   LMP 07/30/2015 (Approximate)   SpO2 96%    Subjective:    Patient ID: Christy Cox, female    DOB: 18-Aug-1975, 43 y.o.   MRN: [id number]  HPI: Christy Cox is a 43 y.o. female  Chief Complaint  Patient presents with  . Abdominal Pain    pt states she has been having lower abdominal pain for the past 2 weeks. States she thinks she has a UTI    2 weeks of feeling "off" - nauseated, shaky feeling on the inside, more anxious than usual, lower abdominal cramping and pain centrally. Had some diarrhea initially but that has cleared up. Tolerating PO well, no vomiting, melena, fevers, chills, sweats, recent medicine changes, diet changes, travel. Gets frequent UTIs with atypical sxs and thinks she may have one now.   Relevant past medical, surgical, family and social history reviewed and updated as indicated. Interim medical history since our last visit reviewed. Allergies and medications reviewed and updated.  Review of Systems  Per HPI unless specifically indicated above     Objective:    BP (!) 89/61   Pulse 74   Temp 97.8 F (36.6 C) (Oral)   LMP 07/30/2015 (Approximate)   SpO2 96%   Wt Readings from Last 3 Encounters:  12/17/18 250 lb (113.4 kg)  11/21/18 250 lb (113.4 kg)  09/21/18 250 lb (113.4 kg)    Physical Exam Vitals signs and nursing note reviewed.  Constitutional:      Appearance: Normal appearance. She is not ill-appearing.  HENT:     Head: Atraumatic.  Eyes:     Extraocular Movements: Extraocular movements intact.     Conjunctiva/sclera: Conjunctivae normal.  Neck:     Musculoskeletal: Normal range of motion and neck supple.  Cardiovascular:     Rate and Rhythm: Normal rate and regular rhythm.     Heart sounds: Normal heart sounds.  Pulmonary:     Effort: Pulmonary effort is normal.     Breath sounds: Normal breath sounds.  Abdominal:     General: Bowel sounds are normal.  There is no distension.     Palpations: Abdomen is soft. There is no mass.     Tenderness: There is abdominal tenderness (suprapubic ttp). There is no right CVA tenderness, left CVA tenderness or guarding.  Musculoskeletal: Normal range of motion.  Skin:    General: Skin is warm and dry.  Neurological:     Mental Status: She is alert and oriented to person, place, and time.  Psychiatric:        Mood and Affect: Mood normal.        Thought Content: Thought content normal.        Judgment: Judgment normal.     Results for orders placed or performed in visit on 11/21/18  Microscopic Examination   URINE  Result Value Ref Range   WBC, UA 6-10 (A) 0 - 5 /hpf   RBC None seen 0 - 2 /hpf   Epithelial Cells (non renal) 0-10 0 - 10 /hpf   Bacteria, UA Many (A) None seen/Few  Urine Culture, Reflex   URINE  Result Value Ref Range   Urine Culture, Routine Final report (A)    Organism ID, Bacteria Escherichia coli (A)    Antimicrobial Susceptibility Comment   UA/M w/rflx Culture, Routine   Specimen: Urine   URINE  Result Value Ref Range  Specific Gravity, UA 1.015 1.005 - 1.030   pH, UA 7.0 5.0 - 7.5   Color, UA Yellow Yellow   Appearance Ur Clear Clear   Leukocytes,UA Trace (A) Negative   Protein,UA Negative Negative/Trace   Glucose, UA Negative Negative   Ketones, UA Negative Negative   RBC, UA Negative Negative   Bilirubin, UA Negative Negative   Urobilinogen, Ur 0.2 0.2 - 1.0 mg/dL   Nitrite, UA Positive (A) Negative   Microscopic Examination See below:    Urinalysis Reflex Comment       Assessment & Plan:   Problem List Items Addressed This Visit      Other   Obesity    Has not yet started the saxenda due to these sxs, will hold off until feeling in her usual state of health and then start it. Knows to f/u 1 month into taking it       Other Visit Diagnoses    Lower abdominal pain    -  Primary   Will obtain some labs for r/o, suspect possibly a stomach virus  initially that is now UTI. Tx UTI and monitor closely for improvement   Relevant Orders   UA/M w/rflx Culture, Routine   Diarrhea, unspecified type       Resolved, check labs, push fluids   Relevant Orders   CBC with Differential/Platelet   Comprehensive metabolic panel   Lipase   Immunity status testing       Requesting testing post stem cell transplant as she was told she would lose all past immunities   Relevant Orders   Varicella zoster antibody, IgG   Measles/Mumps/Rubella Immunity   Hepatitis B Surface AntiBODY       Follow up plan: Return in about 4 weeks (around 01/30/2019) for weight check.

## 2019-01-03 LAB — COMPREHENSIVE METABOLIC PANEL
ALT: 12 IU/L (ref 0–32)
AST: 13 IU/L (ref 0–40)
Albumin/Globulin Ratio: 1.7 (ref 1.2–2.2)
Albumin: 4.4 g/dL (ref 3.8–4.8)
Alkaline Phosphatase: 105 IU/L (ref 39–117)
BUN/Creatinine Ratio: 15 (ref 9–23)
BUN: 11 mg/dL (ref 6–24)
Bilirubin Total: 0.6 mg/dL (ref 0.0–1.2)
CO2: 24 mmol/L (ref 20–29)
Calcium: 9.4 mg/dL (ref 8.7–10.2)
Chloride: 101 mmol/L (ref 96–106)
Creatinine, Ser: 0.74 mg/dL (ref 0.57–1.00)
GFR calc Af Amer: 115 mL/min/{1.73_m2} (ref >59)
GFR calc non Af Amer: 100 mL/min/{1.73_m2} (ref >59)
Globulin, Total: 2.6 g/dL (ref 1.5–4.5)
Glucose: 78 mg/dL (ref 65–99)
Potassium: 4.2 mmol/L (ref 3.5–5.2)
Sodium: 141 mmol/L (ref 134–144)
Total Protein: 7 g/dL (ref 6.0–8.5)

## 2019-01-03 LAB — CBC WITH DIFFERENTIAL/PLATELET
Basophils Absolute: 0 10*3/uL (ref 0.0–0.2)
Basos: 1 %
EOS (ABSOLUTE): 0.4 10*3/uL (ref 0.0–0.4)
Eos: 7 %
Hematocrit: 37.1 % (ref 34.0–46.6)
Hemoglobin: 12.4 g/dL (ref 11.1–15.9)
Immature Grans (Abs): 0 10*3/uL (ref 0.0–0.1)
Immature Granulocytes: 0 %
Lymphocytes Absolute: 1.8 10*3/uL (ref 0.7–3.1)
Lymphs: 31 %
MCH: 29.8 pg (ref 26.6–33.0)
MCHC: 33.4 g/dL (ref 31.5–35.7)
MCV: 89 fL (ref 79–97)
Monocytes Absolute: 0.4 10*3/uL (ref 0.1–0.9)
Monocytes: 7 %
Neutrophils Absolute: 3.2 10*3/uL (ref 1.4–7.0)
Neutrophils: 54 %
Platelets: 147 10*3/uL — ABNORMAL LOW (ref 150–450)
RBC: 4.16 x10E6/uL (ref 3.77–5.28)
RDW: 14.3 % (ref 11.7–15.4)
WBC: 5.9 10*3/uL (ref 3.4–10.8)

## 2019-01-03 LAB — MEASLES/MUMPS/RUBELLA IMMUNITY
MUMPS ABS, IGG: 98 AU/mL (ref >10.9)
RUBEOLA AB, IGG: >300 AU/mL (ref >16.4)
Rubella Antibodies, IGG: 2.58 index (ref >0.99)

## 2019-01-03 LAB — VARICELLA ZOSTER ANTIBODY, IGG: Varicella zoster IgG: 1894 index (ref >165)

## 2019-01-03 LAB — HEPATITIS B SURFACE ANTIBODY,QUALITATIVE: Hep B Surface Ab, Qual: NONREACTIVE

## 2019-01-03 LAB — LIPASE: Lipase: 28 U/L (ref 14–72)

## 2019-01-05 LAB — UA/M W/RFLX CULTURE, ROUTINE
Bilirubin, UA: NEGATIVE
Glucose, UA: NEGATIVE
Ketones, UA: NEGATIVE
Nitrite, UA: POSITIVE — AB
Protein,UA: NEGATIVE
RBC, UA: NEGATIVE
Specific Gravity, UA: 1.02 (ref 1.005–1.030)
Urobilinogen, Ur: 0.2 mg/dL (ref 0.2–1.0)
pH, UA: 7 (ref 5.0–7.5)

## 2019-01-05 LAB — MICROSCOPIC EXAMINATION: RBC, Urine: NONE SEEN /hpf (ref 0–2)

## 2019-01-05 LAB — URINE CULTURE, REFLEX

## 2019-01-08 ENCOUNTER — Other Ambulatory Visit: Payer: Self-pay | Admitting: Family Medicine

## 2019-01-08 DIAGNOSIS — R1030 Lower abdominal pain, unspecified: Secondary | ICD-10-CM

## 2019-01-08 MED ORDER — SERTRALINE HCL 50 MG PO TABS: 50.0000 mg | ORAL_TABLET | Freq: Every day | ORAL | 0 refills | Status: DC

## 2019-01-14 ENCOUNTER — Ambulatory Visit: Payer: BC Managed Care – PPO | Admitting: Gastroenterology

## 2019-01-14 ENCOUNTER — Encounter: Payer: Self-pay | Admitting: Gastroenterology

## 2019-01-18 LAB — HM PAP SMEAR: HM Pap smear: NEGATIVE

## 2019-01-21 ENCOUNTER — Other Ambulatory Visit: Payer: Self-pay | Admitting: Obstetrics and Gynecology

## 2019-01-21 DIAGNOSIS — Z1231 Encounter for screening mammogram for malignant neoplasm of breast: Secondary | ICD-10-CM

## 2019-01-22 ENCOUNTER — Encounter: Payer: Self-pay | Admitting: Family Medicine

## 2019-01-22 DIAGNOSIS — Z20822 Contact with and (suspected) exposure to covid-19: Secondary | ICD-10-CM

## 2019-01-22 DIAGNOSIS — Z20828 Contact with and (suspected) exposure to other viral communicable diseases: Secondary | ICD-10-CM

## 2019-01-23 NOTE — Telephone Encounter (Signed)
Please schedule virtual early next week for COVID exposure.

## 2019-01-30 ENCOUNTER — Other Ambulatory Visit: Payer: Self-pay | Admitting: Family Medicine

## 2019-01-30 NOTE — Telephone Encounter (Signed)
Called pt she states that she is not taking this medication.

## 2019-01-30 NOTE — Telephone Encounter (Signed)
Due for 1 month f/u since this was a new start medicine. Will give 1 month supply to bridge but needs appt. Can be virtual

## 2019-01-30 NOTE — Telephone Encounter (Signed)
Requested medication (s) are due for refill today: yes  Requested medication (s) are on the active medication list: yes  Last refill:  01/08/2019  Future visit scheduled: no  Notes to clinic:  Requesting a 90 day supply instead of 30 tabs   Requested Prescriptions  Pending Prescriptions Disp Refills   sertraline (ZOLOFT) 50 MG tablet [Pharmacy Med Name: SERTRALINE HCL 50 MG TABLET] 90 tablet 1    Sig: TAKE 1 TABLET BY MOUTH EVERY DAY     Psychiatry:  Antidepressants - SSRI Failed - 01/30/2019  1:30 PM      Failed - Completed PHQ-2 or PHQ-9 in the last 360 days.      Passed - Valid encounter within last 6 months    Recent Outpatient Visits          4 weeks ago Lower abdominal pain   [facility] Merrie Roof [place], [place]   1 month ago Class 2 obesity due to excess calories without serious comorbidity with body mass index (BMI) of 39.0 to 39.9 in adult   [facility], [place], [place]   2 months ago Acute lower UTI   [place], [place], [place]   4 months ago Multiple sclerosis [facility])   [place], [place], [place]   5 months ago [place], [place], [place]

## 2019-01-31 ENCOUNTER — Encounter: Payer: Self-pay | Admitting: Family Medicine

## 2019-02-01 ENCOUNTER — Other Ambulatory Visit: Payer: Self-pay | Admitting: Family Medicine

## 2019-02-01 MED ORDER — CIPROFLOXACIN HCL 250 MG PO TABS: 250.0000 mg | ORAL_TABLET | Freq: Two times a day (BID) | ORAL | 0 refills | Status: DC

## 2019-02-28 DIAGNOSIS — S99912A Unspecified injury of left ankle, initial encounter: Secondary | ICD-10-CM | POA: Diagnosis not present

## 2019-02-28 DIAGNOSIS — W010XXA Fall on same level from slipping, tripping and stumbling without subsequent striking against object, initial encounter: Secondary | ICD-10-CM | POA: Diagnosis not present

## 2019-03-03 DIAGNOSIS — R339 Retention of urine, unspecified: Secondary | ICD-10-CM | POA: Diagnosis not present

## 2019-03-06 DIAGNOSIS — G35 Multiple sclerosis: Secondary | ICD-10-CM | POA: Diagnosis not present

## 2019-03-06 DIAGNOSIS — S93492D Sprain of other ligament of left ankle, subsequent encounter: Secondary | ICD-10-CM | POA: Diagnosis not present

## 2019-03-06 DIAGNOSIS — M79605 Pain in left leg: Secondary | ICD-10-CM | POA: Diagnosis not present

## 2019-04-02 ENCOUNTER — Encounter: Payer: Self-pay | Admitting: Family Medicine

## 2019-04-03 NOTE — Telephone Encounter (Signed)
Pt stated she no longer needed this apt, and to disregard message.

## 2019-04-29 ENCOUNTER — Other Ambulatory Visit: Payer: Self-pay

## 2019-04-29 ENCOUNTER — Encounter: Payer: Self-pay | Admitting: Family Medicine

## 2019-04-29 ENCOUNTER — Ambulatory Visit: Payer: BC Managed Care – PPO | Admitting: Family Medicine

## 2019-04-29 ENCOUNTER — Ambulatory Visit (INDEPENDENT_AMBULATORY_CARE_PROVIDER_SITE_OTHER): Payer: BC Managed Care – PPO | Admitting: Family Medicine

## 2019-04-29 VITALS — BP 91/65 | HR 79 | Temp 97.6°F | Ht 67.0 in | Wt 270.6 lb

## 2019-04-29 DIAGNOSIS — N390 Urinary tract infection, site not specified: Secondary | ICD-10-CM | POA: Diagnosis not present

## 2019-04-29 DIAGNOSIS — R399 Unspecified symptoms and signs involving the genitourinary system: Secondary | ICD-10-CM | POA: Diagnosis not present

## 2019-04-29 DIAGNOSIS — Z6841 Body Mass Index (BMI) 40.0 and over, adult: Secondary | ICD-10-CM

## 2019-04-29 MED ORDER — SULFAMETHOXAZOLE-TRIMETHOPRIM 800-160 MG PO TABS: 1.0000 | ORAL_TABLET | Freq: Two times a day (BID) | ORAL | 1 refills | Status: DC

## 2019-04-29 MED ORDER — TOPIRAMATE 25 MG PO TABS: 25.0000 mg | ORAL_TABLET | Freq: Two times a day (BID) | ORAL | 0 refills | Status: DC

## 2019-04-29 NOTE — Progress Notes (Signed)
BP 91/65 (BP Location: Left Arm, Patient Position: Sitting, Cuff Size: Normal)   Pulse 79   Temp 97.6 F (36.4 C) (Oral)   Ht 5\' 7"  (1.702 m)   Wt 270 lb 9.6 oz (122.7 kg)   LMP 07/30/2015 (Approximate)   SpO2 97%   BMI 42.38 kg/m    Subjective:    Patient ID: Christy Cox, female    DOB: March 24, 1975, 44 y.o.   MRN: [id number]  HPI: Christy Cox is a 44 y.o. female  Chief Complaint  Patient presents with  . UTI Symptoms    Ongoing appx 2 weeks   Started about 2 weeks ago having urinary accidents in the night and urinating frequently. Denies fevers, chills, N/V, abdominal pain, hematuria. 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 Gets botox bladder injections through Urology but most recent one was delayed so overdue now.   Was unable to get saxenda due to cost. Plans to restart keto diet which she was successful on last year but also wanting to try a medication.   Relevant past medical, surgical, family and social history reviewed and updated as indicated. Interim medical history since our last visit reviewed. Allergies and medications reviewed and updated.  Review of Systems  Per HPI unless specifically indicated above     Objective:    BP 91/65 (BP Location: Left Arm, Patient Position: Sitting, Cuff Size: Normal)   Pulse 79   Temp 97.6 F (36.4 C) (Oral)   Ht 5\' 7"  (1.702 m)   Wt 270 lb 9.6 oz (122.7 kg)   LMP 07/30/2015 (Approximate)   SpO2 97%   BMI 42.38 kg/m   Wt Readings from Last 3 Encounters:  04/29/19 270 lb 9.6 oz (122.7 kg)  12/17/18 250 lb (113.4 kg)  11/21/18 250 lb (113.4 kg)    Physical Exam Vitals and nursing note reviewed.  Constitutional:      Appearance: Normal appearance. She is not ill-appearing.  HENT:     Head: Atraumatic.  Eyes:     Extraocular Movements: Extraocular movements intact.     Conjunctiva/sclera: Conjunctivae normal.  Cardiovascular:     Rate and Rhythm: Normal rate and regular  rhythm.     Heart sounds: Normal heart sounds.  Pulmonary:     Effort: Pulmonary effort is normal.     Breath sounds: Normal breath sounds.  Abdominal:     General: Bowel sounds are normal. There is no distension.     Palpations: Abdomen is soft. There is no mass.     Tenderness: There is no guarding.  Musculoskeletal:        General: Normal range of motion.     Cervical back: Normal range of motion and neck supple.  Skin:    General: Skin is warm and dry.  Neurological:     Mental Status: She is alert and oriented to person, place, and time.  Psychiatric:        Mood and Affect: Mood normal.        Thought Content: Thought content normal.        Judgment: Judgment normal.     Results for orders placed or performed in visit on 01/02/19  Microscopic Examination   URINE  Result Value Ref Range   WBC, UA 6-10 (A) 0 - 5 /hpf   RBC None seen 0 - 2 /hpf   Epithelial Cells (non renal) 0-10 0 - 10 /hpf   Bacteria, UA Many (A) None seen/Few  Urine Culture, Reflex   URINE  Result Value Ref Range   Urine Culture, Routine Final report (A)    Organism ID, Bacteria Escherichia coli (A)    Antimicrobial Susceptibility Comment   UA/M w/rflx Culture, Routine   Specimen: Urine   URINE  Result Value Ref Range   Specific Gravity, UA 1.020 1.005 - 1.030   pH, UA 7.0 5.0 - 7.5   Color, UA Yellow Yellow   Appearance Ur Clear Clear   Leukocytes,UA 1+ (A) Negative   Protein,UA Negative Negative/Trace   Glucose, UA Negative Negative   Ketones, UA Negative Negative   RBC, UA Negative Negative   Bilirubin, UA Negative Negative   Urobilinogen, Ur 0.2 0.2 - 1.0 mg/dL   Nitrite, UA Positive (A) Negative   Microscopic Examination See below:    Urinalysis Reflex Comment   Comprehensive metabolic panel  Result Value Ref Range   Glucose 78 65 - 99 mg/dL   BUN 11 6 - 24 mg/dL   Creatinine, Ser 0.74 0.57 - 1.00 mg/dL   GFR calc non Af Amer 100 >59 mL/min/1.73   GFR calc Af Amer 115 >59  mL/min/1.73   BUN/Creatinine Ratio 15 9 - 23   Sodium 141 134 - 144 mmol/L   Potassium 4.2 3.5 - 5.2 mmol/L   Chloride 101 96 - 106 mmol/L   CO2 24 20 - 29 mmol/L   Calcium 9.4 8.7 - 10.2 mg/dL   Total Protein 7.0 6.0 - 8.5 g/dL   Albumin 4.4 3.8 - 4.8 g/dL   Globulin, Total 2.6 1.5 - 4.5 g/dL   Albumin/Globulin Ratio 1.7 1.2 - 2.2   Bilirubin Total 0.6 0.0 - 1.2 mg/dL   Alkaline Phosphatase 105 39 - 117 IU/L   AST 13 0 - 40 IU/L   ALT 12 0 - 32 IU/L  CBC with Differential/Platelet  Result Value Ref Range   WBC 5.9 3.4 - 10.8 x10E3/uL   RBC 4.16 3.77 - 5.28 x10E6/uL   Hemoglobin 12.4 11.1 - 15.9 g/dL   Hematocrit 37.1 34.0 - 46.6 %   MCV 89 79 - 97 fL   MCH 29.8 26.6 - 33.0 pg   MCHC 33.4 31.5 - 35.7 g/dL   RDW 14.3 11.7 - 15.4 %   Platelets 147 (L) 150 - 450 x10E3/uL   Neutrophils 54 Not Estab. %   Lymphs 31 Not Estab. %   Monocytes 7 Not Estab. %   Eos 7 Not Estab. %   Basos 1 Not Estab. %   Neutrophils Absolute 3.2 1.4 - 7.0 x10E3/uL   Lymphocytes Absolute 1.8 0.7 - 3.1 x10E3/uL   Monocytes Absolute 0.4 0.1 - 0.9 x10E3/uL   EOS (ABSOLUTE) 0.4 0.0 - 0.4 x10E3/uL   Basophils Absolute 0.0 0.0 - 0.2 x10E3/uL   Immature Granulocytes 0 Not Estab. %   Immature Grans (Abs) 0.0 0.0 - 0.1 x10E3/uL  Lipase  Result Value Ref Range   Lipase 28 14 - 72 U/L  Varicella zoster antibody, IgG  Result Value Ref Range   Varicella zoster IgG 1,894 Immune >165 index  Measles/Mumps/Rubella Immunity  Result Value Ref Range   Rubella Antibodies, IGG 2.58 Immune >0.99 index   RUBEOLA AB, IGG >300.0 Immune >16.4 AU/mL   MUMPS ABS, IGG 98.0 Immune >10.9 AU/mL  Hepatitis B Surface AntiBODY  Result Value Ref Range   Hep B Surface Ab, Qual Non Reactive       Assessment & Plan:   Problem List Items Addressed This Visit  Genitourinary   Recurrent UTI    U/a + for UTI, tx with bactrim and monitor for improvement. F/u with Urology for botox injections as scheduled      Relevant  Medications   sulfamethoxazole-trimethoprim (BACTRIM DS) 800-160 MG tablet     Other   Obesity    Will trial topamax, risks and precautions reviewed. Continue working on diet and exercise changes       Other Visit Diagnoses    UTI symptoms    -  Primary   Relevant Orders   UA/M w/rflx Culture, Routine       Follow up plan: Return in about 4 weeks (around 05/27/2019) for weight f/u.

## 2019-04-29 NOTE — Assessment & Plan Note (Signed)
Will trial topamax, risks and precautions reviewed. Continue working on diet and exercise changes

## 2019-04-29 NOTE — Assessment & Plan Note (Signed)
U/a + for UTI, tx with bactrim and monitor for improvement. F/u with Urology for botox injections as scheduled

## 2019-05-01 ENCOUNTER — Ambulatory Visit: Payer: BC Managed Care – PPO | Admitting: Family Medicine

## 2019-05-05 LAB — UA/M W/RFLX CULTURE, ROUTINE
Bilirubin, UA: NEGATIVE
Glucose, UA: NEGATIVE
Ketones, UA: NEGATIVE
Nitrite, UA: POSITIVE — AB
Protein,UA: NEGATIVE
RBC, UA: NEGATIVE
Specific Gravity, UA: 1.02 (ref 1.005–1.030)
Urobilinogen, Ur: 0.2 mg/dL (ref 0.2–1.0)
pH, UA: 5.5 (ref 5.0–7.5)

## 2019-05-05 LAB — MICROSCOPIC EXAMINATION: RBC, Urine: NONE SEEN /hpf (ref 0–2)

## 2019-05-05 LAB — URINE CULTURE, REFLEX

## 2019-05-15 LAB — UA/M W/RFLX CULTURE, ROUTINE
Bilirubin, UA: NEGATIVE
Glucose, UA: NEGATIVE
Leukocytes,UA: NEGATIVE
Nitrite, UA: NEGATIVE
Protein,UA: NEGATIVE
RBC, UA: NEGATIVE
Specific Gravity, UA: 1.014 (ref 1.005–1.030)
Urobilinogen, Ur: 0.2 mg/dL (ref 0.2–1.0)
pH, UA: 5.5 (ref 5.0–7.5)

## 2019-05-15 LAB — MICROSCOPIC EXAMINATION
Bacteria, UA: NONE SEEN
Casts: NONE SEEN /lpf
Epithelial Cells (non renal): NONE SEEN /hpf (ref 0–10)
RBC, Urine: NONE SEEN /hpf (ref 0–2)
WBC, UA: NONE SEEN /hpf (ref 0–5)

## 2019-05-21 ENCOUNTER — Other Ambulatory Visit: Payer: Self-pay | Admitting: Family Medicine

## 2019-05-21 NOTE — Telephone Encounter (Signed)
LOV: 04/29/2019, NOV: 05/27/2019

## 2019-05-21 NOTE — Telephone Encounter (Signed)
Requested medication (s) are due for refill today: yes  Requested medication (s) are on the active medication list: yes  Last refill: 08/10/2018  Future visit scheduled: yes  Notes to clinic:  historical provider    Requested Prescriptions  Pending Prescriptions Disp Refills   SYMBICORT 160-4.5 MCG/ACT inhaler [Pharmacy Med Name: SYMBICORT 160-4.5 MCG INHALER] 30.6 Inhaler 1    Sig: TAKE 2 PUFFS BY MOUTH TWICE A DAY      Pulmonology:  Combination Products Passed - 05/21/2019 11:17 AM      Passed - Valid encounter within last 12 months    Recent Outpatient Visits           3 weeks ago UTI symptoms   [facility] [place], [place]   4 months ago Lower abdominal pain   [place], [place], [place]   5 months ago Class 2 obesity due to excess calories without serious comorbidity with body mass index (BMI) of 39.0 to 39.9 in adult   [facility], [place], [place]   6 months ago Acute lower UTI   [facility], [place], [place]   8 months ago Multiple sclerosis [facility])   [place], [place], [place]       Future Appointments             In 6 days [facility], [place], [place], [place]

## 2019-05-27 ENCOUNTER — Encounter: Payer: Self-pay | Admitting: Family Medicine

## 2019-05-27 ENCOUNTER — Ambulatory Visit (INDEPENDENT_AMBULATORY_CARE_PROVIDER_SITE_OTHER): Payer: BC Managed Care – PPO | Admitting: Family Medicine

## 2019-05-27 ENCOUNTER — Other Ambulatory Visit: Payer: Self-pay

## 2019-05-27 VITALS — BP 95/68 | HR 67 | Temp 97.6°F | Wt 257.0 lb

## 2019-05-27 DIAGNOSIS — Z6841 Body Mass Index (BMI) 40.0 and over, adult: Secondary | ICD-10-CM

## 2019-05-27 MED ORDER — TOPIRAMATE 50 MG PO TABS: 50.0000 mg | ORAL_TABLET | Freq: Two times a day (BID) | ORAL | 2 refills | Status: DC

## 2019-05-27 NOTE — Assessment & Plan Note (Signed)
Congratulated success with 15 lb weight loss in first month. Discussed increasing topamax to 50 mg BID and monitoring closely for benefit and tolerance. Continue working on diet and exercise changes

## 2019-05-27 NOTE — Progress Notes (Signed)
BP 95/68   Pulse 67   Temp 97.6 F (36.4 C) (Oral)   Wt 257 lb (116.6 kg)   LMP 07/30/2015 (Approximate)   SpO2 99%   BMI 40.25 kg/m    Subjective:    Patient ID: Christy Cox, female    DOB: March 22, 1975, 44 y.o.   MRN: [id number]  HPI: Christy Cox is a 44 y.o. female  Chief Complaint  Patient presents with  . Weight Check   Patient presenting today for weight f/u after starting on topamax for weight control. Notes excellent tolerance and about 15 lb weight loss so far. Also notes she's no longer getting the few migraine headaches she would get weekly. Feels the medicine is incredibly helpful as far as controlling her appetite.   Relevant past medical, surgical, family and social history reviewed and updated as indicated. Interim medical history since our last visit reviewed. Allergies and medications reviewed and updated.  Review of Systems  Per HPI unless specifically indicated above     Objective:    BP 95/68   Pulse 67   Temp 97.6 F (36.4 C) (Oral)   Wt 257 lb (116.6 kg)   LMP 07/30/2015 (Approximate)   SpO2 99%   BMI 40.25 kg/m   Wt Readings from Last 3 Encounters:  05/27/19 257 lb (116.6 kg)  04/29/19 270 lb 9.6 oz (122.7 kg)  12/17/18 250 lb (113.4 kg)    Physical Exam Vitals and nursing note reviewed.  Constitutional:      Appearance: Normal appearance. She is not ill-appearing.  HENT:     Head: Atraumatic.  Eyes:     Extraocular Movements: Extraocular movements intact.     Conjunctiva/sclera: Conjunctivae normal.  Cardiovascular:     Rate and Rhythm: Normal rate and regular rhythm.     Heart sounds: Normal heart sounds.  Pulmonary:     Effort: Pulmonary effort is normal.     Breath sounds: Normal breath sounds.  Musculoskeletal:        General: Normal range of motion.     Cervical back: Normal range of motion and neck supple.  Skin:    General: Skin is warm and dry.  Neurological:     Mental Status: She is alert and  oriented to person, place, and time.  Psychiatric:        Mood and Affect: Mood normal.        Thought Content: Thought content normal.        Judgment: Judgment normal.     Results for orders placed or performed in visit on 04/29/19  Microscopic Examination   URINE  Result Value Ref Range   WBC, UA 6-10 (A) 0 - 5 /hpf   RBC None seen 0 - 2 /hpf   Epithelial Cells (non renal) 0-10 0 - 10 /hpf   Bacteria, UA Many (A) None seen/Few  Urine Culture, Reflex   URINE  Result Value Ref Range   Urine Culture, Routine Final report (A)    Organism ID, Bacteria Comment (A)    Antimicrobial Susceptibility Comment   Microscopic Examination  Result Value Ref Range   WBC, UA None seen 0 - 5 /hpf   RBC None seen 0 - 2 /hpf   Epithelial Cells (non renal) None seen 0 - 10 /hpf   Casts None seen None seen /lpf   Bacteria, UA None seen None seen/Few  UA/M w/rflx Culture, Routine   Specimen: Urine   URINE  Result Value Ref Range  Specific Gravity, UA 1.020 1.005 - 1.030   pH, UA 5.5 5.0 - 7.5   Color, UA Yellow Yellow   Appearance Ur Clear Clear   Leukocytes,UA 2+ (A) Negative   Protein,UA Negative Negative/Trace   Glucose, UA Negative Negative   Ketones, UA Negative Negative   RBC, UA Negative Negative   Bilirubin, UA Negative Negative   Urobilinogen, Ur 0.2 0.2 - 1.0 mg/dL   Nitrite, UA Positive (A) Negative   Microscopic Examination See below:    Urinalysis Reflex Comment   UA/M w/rflx Culture, Routine  Result Value Ref Range   Specific Gravity, UA 1.014 1.005 - 1.030   pH, UA 5.5 5.0 - 7.5   Color, UA Yellow Yellow   Appearance Ur Clear Clear   Leukocytes,UA Negative Negative   Protein,UA Negative Negative/Trace   Glucose, UA Negative Negative   Ketones, UA 1+ (A) Negative   RBC, UA Negative Negative   Bilirubin, UA Negative Negative   Urobilinogen, Ur 0.2 0.2 - 1.0 mg/dL   Nitrite, UA Negative Negative   Microscopic Examination Comment    Microscopic Examination See  below:    Urinalysis Reflex Comment       Assessment & Plan:   Problem List Items Addressed This Visit      Other   Obesity - Primary    Congratulated success with 15 lb weight loss in first month. Discussed increasing topamax to 50 mg BID and monitoring closely for benefit and tolerance. Continue working on diet and exercise changes          Follow up plan: Return in about 3 months (around 08/27/2019) for Weight check.

## 2019-05-29 ENCOUNTER — Ambulatory Visit
Admission: RE | Admit: 2019-05-29 | Discharge: 2019-05-29 | Disposition: A | Discharge status: Home / Self Care | Payer: BC Managed Care – PPO | Source: Ambulatory Visit | Attending: Obstetrics and Gynecology | Admitting: Obstetrics and Gynecology

## 2019-05-29 DIAGNOSIS — Z1231 Encounter for screening mammogram for malignant neoplasm of breast: Secondary | ICD-10-CM | POA: Diagnosis present

## 2019-05-30 DIAGNOSIS — R350 Frequency of micturition: Secondary | ICD-10-CM | POA: Diagnosis not present

## 2019-06-16 ENCOUNTER — Other Ambulatory Visit: Payer: Self-pay | Admitting: Family Medicine

## 2019-06-16 NOTE — Telephone Encounter (Signed)
Requested medication (s) are due for refill today: n/a  Requested medication (s) are on the active medication list: no  Last refill:  05/22/19  Future visit scheduled: yes  Notes to clinic:  dose changed to 50 mg twice daily. Medication not delegated to approve/refuse   Requested Prescriptions  Pending Prescriptions Disp Refills   topiramate (TOPAMAX) 25 MG tablet [Pharmacy Med Name: TOPIRAMATE 25 MG TABLET] 60 tablet 0    Sig: TAKE 1 TABLET BY MOUTH TWICE A DAY      Not Delegated - Neurology: Anticonvulsants - topiramate & zonisamide Failed - 06/16/2019  9:31 AM      Failed - This refill cannot be delegated      Passed - Cr in normal range and within 360 days    Creatinine, Ser  Date Value Ref Range Status  01/02/2019 0.74 0.57 - 1.00 mg/dL Final          Passed - CO2 in normal range and within 360 days    CO2  Date Value Ref Range Status  01/02/2019 24 20 - 29 mmol/L Final          Passed - Valid encounter within last 12 months    Recent Outpatient Visits           2 weeks ago Class 3 severe obesity due to excess calories without serious comorbidity with body mass index (BMI) of 40.0 to 44.9 in adult [facility])   [facility] Volney American, PA-C   1 month ago UTI symptoms   [place], [place], [place]   5 months ago Lower abdominal pain   [place], [place], [place]   6 months ago Class 2 obesity due to excess calories without serious comorbidity with body mass index (BMI) of 39.0 to 39.9 in adult   [facility], [place], [place]   6 months ago Acute lower UTI   [facility], [place]ilia Arg[place], [place]       Future Appointments             In 2 months Orene Desanctis, Lilia Argue, [place], [place]

## 2019-06-17 NOTE — Telephone Encounter (Signed)
Routing to provider  

## 2019-06-18 ENCOUNTER — Telehealth: Payer: Self-pay | Admitting: Family Medicine

## 2019-06-18 ENCOUNTER — Other Ambulatory Visit: Payer: Self-pay | Admitting: Family Medicine

## 2019-06-18 NOTE — Telephone Encounter (Deleted)
Second request from pharmacy for the wrong dose of medication for the pt. Med was refused once and received another request for the same dose.  Dose changed and LRF 05/27/19 #30 with 2 RF. Last filled : 05/27/19 #30 with 2 RF

## 2019-06-18 NOTE — Telephone Encounter (Signed)
No note from billing department   Copied from [place] [id number]. Topic: Quick Communication - See Telephone Encounter >> Jun 18, 2019 10:14 AM Loma [place] wrote: CRM for notification. See Telephone encounter for: 06/18/19. Pt states that she has been told by billing department that the billing issue was on the provider side, relaying issue to office with any FU

## 2019-06-28 NOTE — Telephone Encounter (Signed)
Spoke with patient regarding billing issue.  Pt will contact [facility] [employer] due to insurance timely filing time expiring in 2018 for one claim and 2019 for the other. Additional documentation noted in Account Notes.

## 2019-07-10 DIAGNOSIS — R339 Retention of urine, unspecified: Secondary | ICD-10-CM | POA: Diagnosis not present

## 2019-07-12 ENCOUNTER — Other Ambulatory Visit: Payer: Self-pay

## 2019-07-12 ENCOUNTER — Encounter: Payer: Self-pay | Admitting: Family Medicine

## 2019-07-12 ENCOUNTER — Ambulatory Visit (INDEPENDENT_AMBULATORY_CARE_PROVIDER_SITE_OTHER): Payer: BLUE CROSS/BLUE SHIELD | Admitting: Family Medicine

## 2019-07-12 VITALS — BP 94/69 | HR 77 | Temp 98.1°F | Ht 65.75 in | Wt 255.2 lb

## 2019-07-12 DIAGNOSIS — N390 Urinary tract infection, site not specified: Secondary | ICD-10-CM

## 2019-07-12 DIAGNOSIS — R300 Dysuria: Secondary | ICD-10-CM | POA: Diagnosis not present

## 2019-07-12 NOTE — Progress Notes (Signed)
BP 94/69   Pulse 77   Temp 98.1 F (36.7 C)   Ht 5' 5.75" (1.67 m)   Wt 255 lb 4 oz (115.8 kg)   LMP 07/30/2015 (Approximate)   SpO2 100%   BMI 41.51 kg/m    Subjective:    Patient ID: Christy Cox, female    DOB: September 26, 1975, 44 y.o.   MRN: [id number]  HPI: Christy Cox is a 44 y.o. female  Chief Complaint  Patient presents with  . Urinary Tract Infection   Leg weakness, tripping the past month or so. This is her typical pattern when she has a UTI with her hx of MS. Followed by Urology, receiving botox injections to bladder about every 4 months which typically helps with her chronic bladder sxs and recurrent UTIs but does not feel like she's benefited the last few times.  Denies fever, chills, dysuria, hematuria.     Relevant past medical, surgical, family and social history reviewed and updated as indicated. Interim medical history since our last visit reviewed. Allergies and medications reviewed and updated.  Review of Systems  Per HPI unless specifically indicated above     Objective:    BP 94/69   Pulse 77   Temp 98.1 F (36.7 C)   Ht 5' 5.75" (1.67 m)   Wt 255 lb 4 oz (115.8 kg)   LMP 07/30/2015 (Approximate)   SpO2 100%   BMI 41.51 kg/m   Wt Readings from Last 3 Encounters:  07/12/19 255 lb 4 oz (115.8 kg)  05/27/19 257 lb (116.6 kg)  04/29/19 270 lb 9.6 oz (122.7 kg)    Physical Exam Vitals and nursing note reviewed.  Constitutional:      Appearance: Normal appearance. She is not ill-appearing.  HENT:     Head: Atraumatic.  Eyes:     Extraocular Movements: Extraocular movements intact.     Conjunctiva/sclera: Conjunctivae normal.  Cardiovascular:     Rate and Rhythm: Normal rate and regular rhythm.     Heart sounds: Normal heart sounds.  Pulmonary:     Effort: Pulmonary effort is normal.     Breath sounds: Normal breath sounds.  Abdominal:     General: Bowel sounds are normal. There is no distension.     Palpations:  Abdomen is soft.     Tenderness: There is no abdominal tenderness. There is no right CVA tenderness, left CVA tenderness or guarding.  Musculoskeletal:        General: Normal range of motion.     Cervical back: Normal range of motion and neck supple.  Skin:    General: Skin is warm and dry.  Neurological:     Mental Status: She is alert and oriented to person, place, and time.  Psychiatric:        Mood and Affect: Mood normal.        Thought Content: Thought content normal.        Judgment: Judgment normal.     Results for orders placed or performed in visit on 07/12/19  Microscopic Examination   URINE  Result Value Ref Range   WBC, UA 11-30 (A) 0 - 5 /hpf   RBC 3-10 (A) 0 - 2 /hpf   Epithelial Cells (non renal) 0-10 0 - 10 /hpf   Bacteria, UA Many (A) None seen/Few  Urine Culture, Reflex   URINE  Result Value Ref Range   Urine Culture, Routine WILL FOLLOW   UA/M w/rflx Culture, Routine (STAT)   Specimen:  Urine   URINE  Result Value Ref Range   Specific Gravity, UA 1.025 1.005 - 1.030   pH, UA 5.0 5.0 - 7.5   Color, UA Yellow Yellow   Appearance Ur Clear Clear   Leukocytes,UA Trace (A) Negative   Protein,UA Negative Negative/Trace   Glucose, UA Negative Negative   Ketones, UA 2+ (A) Negative   RBC, UA Trace (A) Negative   Bilirubin, UA Negative Negative   Urobilinogen, Ur 0.2 0.2 - 1.0 mg/dL   Nitrite, UA Negative Negative   Microscopic Examination See below:    Urinalysis Reflex Comment       Assessment & Plan:   Problem List Items Addressed This Visit    None    Visit Diagnoses    Acute lower UTI    -  Primary   Trace leukocytes in urine, but given hx of recurrent UTIs and sxs will start abx and monitor for improvement. Fu with Urology if not better   Relevant Medications   sulfamethoxazole-trimethoprim (BACTRIM DS) 800-160 MG tablet   Other Relevant Orders   UA/M w/rflx Culture, Routine (STAT) (Completed)       Follow up plan: Return if symptoms  worsen or fail to improve.

## 2019-07-15 ENCOUNTER — Encounter: Payer: Self-pay | Admitting: Family Medicine

## 2019-07-15 LAB — UA/M W/RFLX CULTURE, ROUTINE
Bilirubin, UA: NEGATIVE
Glucose, UA: NEGATIVE
Nitrite, UA: NEGATIVE
Protein,UA: NEGATIVE
Specific Gravity, UA: 1.025 (ref 1.005–1.030)
Urobilinogen, Ur: 0.2 mg/dL (ref 0.2–1.0)
pH, UA: 5 (ref 5.0–7.5)

## 2019-07-15 LAB — MICROSCOPIC EXAMINATION

## 2019-07-15 LAB — URINE CULTURE, REFLEX

## 2019-07-15 MED ORDER — SULFAMETHOXAZOLE-TRIMETHOPRIM 800-160 MG PO TABS: 1.0000 | ORAL_TABLET | Freq: Two times a day (BID) | ORAL | 0 refills | Status: DC

## 2019-07-16 DIAGNOSIS — Z03818 Encounter for observation for suspected exposure to other biological agents ruled out: Secondary | ICD-10-CM | POA: Diagnosis not present

## 2019-08-07 DIAGNOSIS — R339 Retention of urine, unspecified: Secondary | ICD-10-CM | POA: Diagnosis not present

## 2019-08-10 ENCOUNTER — Other Ambulatory Visit: Payer: Self-pay | Admitting: Family Medicine

## 2019-08-10 NOTE — Telephone Encounter (Signed)
Requested medication (s) are due for refill today: yes  Requested medication (s) are on the active medication list: yes  Last refill:  05/27/19  Future visit scheduled: yes  Notes to clinic:  med not delegated to refill or refuse   Requested Prescriptions  Pending Prescriptions Disp Refills   topiramate (TOPAMAX) 50 MG tablet [Pharmacy Med Name: TOPIRAMATE 50 MG TABLET] 60 tablet 2    Sig: TAKE 1 TABLET BY MOUTH TWICE A DAY      Not Delegated - Neurology: Anticonvulsants - topiramate & zonisamide Failed - 08/10/2019  8:34 AM      Failed - This refill cannot be delegated      Passed - Cr in normal range and within 360 days    Creatinine, Ser  Date Value Ref Range Status  01/02/2019 0.74 0.57 - 1.00 mg/dL Final          Passed - CO2 in normal range and within 360 days    CO2  Date Value Ref Range Status  01/02/2019 24 20 - 29 mmol/L Final          Passed - Valid encounter within last 12 months    Recent Outpatient Visits           4 weeks ago Acute lower UTI   [facility] Volney American, PA-C   2 months ago Class 3 severe obesity due to excess calories without serious comorbidity with body mass index (BMI) of 40.0 to 44.9 in adult [facility])   [facility] Volney American, [place]   3 months ago UTI symptoms   [place], [place], [place]   7 months ago Lower abdominal pain   [place], [place], [place]   7 months ago Class 2 obesity due to excess calories without serious comorbidity with body mass index (BMI) of 39.0 to 39.9 in adult   [facility], [place], [place]       Future Appointments             In 2 weeks Orene Desanctis, [place], PA-C [facility], [place]

## 2019-08-12 NOTE — Telephone Encounter (Signed)
Routing to provider  

## 2019-08-27 ENCOUNTER — Other Ambulatory Visit: Payer: Self-pay | Admitting: Family Medicine

## 2019-08-27 NOTE — Telephone Encounter (Signed)
Medication called in to pharmacy.

## 2019-08-27 NOTE — Telephone Encounter (Signed)
Requested medication (s) are due for refill today: no  Requested medication (s) are on the active medication list: no  Last refill:  Noted to be discontinued on 05/27/19 due to dosage change  Future visit scheduled: no  Notes to clinic:  Please review for refill. Not delegated per protocol    Requested Prescriptions  Pending Prescriptions Disp Refills   topiramate (TOPAMAX) 25 MG tablet [Pharmacy Med Name: TOPIRAMATE 25 MG TABLET] 60 tablet 0    Sig: TAKE 1 TABLET BY MOUTH TWICE A DAY      Not Delegated - Neurology: Anticonvulsants - topiramate & zonisamide Failed - 08/27/2019 12:47 PM      Failed - This refill cannot be delegated      Passed - Cr in normal range and within 360 days    Creatinine, Ser  Date Value Ref Range Status  01/02/2019 0.74 0.57 - 1.00 mg/dL Final          Passed - CO2 in normal range and within 360 days    CO2  Date Value Ref Range Status  01/02/2019 24 20 - 29 mmol/L Final          Passed - Valid encounter within last 12 months    Recent Outpatient Visits           1 month ago Acute lower UTI   [facility] Volney American, PA-C   3 months ago Class 3 severe obesity due to excess calories without serious comorbidity with body mass index (BMI) of 40.0 to 44.9 in adult [facility])   [facility] Volney American, [place]   4 months ago UTI symptoms   [place], [place], [place]   7 months ago Lower abdominal pain   [place], [place], [place]   8 months ago Class 2 obesity due to excess calories without serious comorbidity with body mass index (BMI) of 39.0 to 39.9 in adult   [facility], [place], [place]

## 2019-08-28 ENCOUNTER — Ambulatory Visit: Payer: Medicare Other | Admitting: Family Medicine

## 2019-09-09 ENCOUNTER — Encounter: Payer: Self-pay | Admitting: Family Medicine

## 2019-09-10 NOTE — Telephone Encounter (Signed)
Needs appointment to discuss; please have her bring another form to appt

## 2019-09-11 ENCOUNTER — Encounter: Payer: Self-pay | Admitting: Family Medicine

## 2019-09-11 ENCOUNTER — Telehealth (INDEPENDENT_AMBULATORY_CARE_PROVIDER_SITE_OTHER): Payer: BLUE CROSS/BLUE SHIELD | Admitting: Family Medicine

## 2019-09-11 VITALS — Wt 255.0 lb

## 2019-09-11 DIAGNOSIS — G35 Multiple sclerosis: Secondary | ICD-10-CM

## 2019-09-11 NOTE — Telephone Encounter (Signed)
Called pt, straight to VM

## 2019-09-11 NOTE — Progress Notes (Signed)
Wt 255 lb (115.7 kg)   LMP 07/30/2015 (Approximate)   BMI 41.47 kg/m    Subjective:    Patient ID: Christy Cox, female    DOB: Apr 19, 1975, 44 y.o.   MRN: [id number]  HPI: Christy Cox is a 44 y.o. female  Chief Complaint  Patient presents with  . Paperwork    intermittent absences for work    . This visit was completed via MyChart due to the restrictions of the COVID-19 pandemic. All issues as above were discussed and addressed. Physical exam was done as above through visual confirmation on MyChart. If it was felt that the patient should be evaluated in the office, they were directed there. The patient verbally consented to this visit. . Location of the patient: home . Location of the provider: work . Those involved with this call:  . Provider: Merrie Roof, PA-C . CMA: Lesle Chris, [place] . Front Desk/Registration: Don Perking  . Time spent on call: 10 minutes with patient face to face via video conference. More than 50% of this time was spent in counseling and coordination of care. 5 minutes total spent in review of patient's record and preparation of their chart. I verified patient identity using two factors (patient name and date of birth). Patient consents verbally to being seen via telemedicine visit today.   Here today to renew her FMLA documentation for her MS. Condition is stable and followed by Neurology loosely s/p stem cell transplant several years ago. Overall doing very well, still having numerous bladder issues and receiving botox injections and prn UTI tx for this. Falling at times but not often and no injuries recently with falls. No new concerns.   Relevant past medical, surgical, family and social history reviewed and updated as indicated. Interim medical history since our last visit reviewed. Allergies and medications reviewed and updated.  Review of Systems  Per HPI unless specifically indicated above     Objective:    Wt 255 lb  (115.7 kg)   LMP 07/30/2015 (Approximate)   BMI 41.47 kg/m   Wt Readings from Last 3 Encounters:  09/11/19 255 lb (115.7 kg)  07/12/19 255 lb 4 oz (115.8 kg)  05/27/19 257 lb (116.6 kg)    Physical Exam Vitals and nursing note reviewed.  Constitutional:      General: She is not in acute distress.    Appearance: Normal appearance.  HENT:     Head: Atraumatic.     Right Ear: External ear normal.     Left Ear: External ear normal.     Nose: Nose normal. No congestion.     Mouth/Throat:     Mouth: Mucous membranes are moist.     Pharynx: Oropharynx is clear. No posterior oropharyngeal erythema.  Eyes:     Extraocular Movements: Extraocular movements intact.     Conjunctiva/sclera: Conjunctivae normal.  Cardiovascular:     Comments: Unable to assess via virtual visit Pulmonary:     Effort: Pulmonary effort is normal. No respiratory distress.  Musculoskeletal:        General: Normal range of motion.     Cervical back: Normal range of motion.  Skin:    General: Skin is dry.     Findings: No erythema.  Neurological:     Mental Status: She is alert and oriented to person, place, and time.  Psychiatric:        Mood and Affect: Mood normal.        Thought Content: Thought content  normal.        Judgment: Judgment normal.     Results for orders placed or performed in visit on 07/12/19  Microscopic Examination   URINE  Result Value Ref Range   WBC, UA 11-30 (A) 0 - 5 /hpf   RBC 3-10 (A) 0 - 2 /hpf   Epithelial Cells (non renal) 0-10 0 - 10 /hpf   Bacteria, UA Many (A) None seen/Few  Urine Culture, Reflex   URINE  Result Value Ref Range   Urine Culture, Routine Final report (A)    Organism ID, Bacteria Escherichia coli (A)    Antimicrobial Susceptibility Comment   UA/M w/rflx Culture, Routine (STAT)   Specimen: Urine   URINE  Result Value Ref Range   Specific Gravity, UA 1.025 1.005 - 1.030   pH, UA 5.0 5.0 - 7.5   Color, UA Yellow Yellow   Appearance Ur Clear Clear    Leukocytes,UA Trace (A) Negative   Protein,UA Negative Negative/Trace   Glucose, UA Negative Negative   Ketones, UA 2+ (A) Negative   RBC, UA Trace (A) Negative   Bilirubin, UA Negative Negative   Urobilinogen, Ur 0.2 0.2 - 1.0 mg/dL   Nitrite, UA Negative Negative   Microscopic Examination See below:    Urinalysis Reflex Comment       Assessment & Plan:   Problem List Items Addressed This Visit      Nervous and Auditory   Multiple sclerosis (HCC) - Primary (Chronic)    Stable, no change since last year. Continue per Neurology recommendations. FMLA form completed and sent in           Follow up plan: Return for as scheduled.

## 2019-09-12 NOTE — Telephone Encounter (Signed)
Leave of absence form faxed over to the fax number in the form

## 2019-09-16 NOTE — Assessment & Plan Note (Signed)
Stable, no change since last year. Continue per Neurology recommendations. FMLA form completed and sent in

## 2019-09-24 ENCOUNTER — Other Ambulatory Visit: Payer: Self-pay

## 2019-09-24 ENCOUNTER — Encounter: Payer: Self-pay | Admitting: Family Medicine

## 2019-09-24 ENCOUNTER — Ambulatory Visit (INDEPENDENT_AMBULATORY_CARE_PROVIDER_SITE_OTHER): Payer: BLUE CROSS/BLUE SHIELD | Admitting: Family Medicine

## 2019-09-24 VITALS — BP 107/75 | HR 69 | Temp 97.8°F | Ht 66.0 in | Wt 254.8 lb

## 2019-09-24 DIAGNOSIS — N390 Urinary tract infection, site not specified: Secondary | ICD-10-CM

## 2019-09-24 MED ORDER — SULFAMETHOXAZOLE-TRIMETHOPRIM 800-160 MG PO TABS: 1.0000 | ORAL_TABLET | Freq: Two times a day (BID) | ORAL | 0 refills | Status: DC

## 2019-09-24 MED ORDER — TOPIRAMATE 100 MG PO TABS
100.0000 mg | ORAL_TABLET | Freq: Two times a day (BID) | ORAL | 1 refills | Status: DC
Start: 2019-09-24 — End: 2019-11-18

## 2019-09-24 NOTE — Progress Notes (Signed)
BP 107/75 (BP Location: Left Arm, Patient Position: Sitting, Cuff Size: Normal)   Pulse 69   Temp 97.8 F (36.6 C) (Oral)   Ht 5\' 6"  (1.676 m)   Wt (!) 254 lb 12.8 oz (115.6 kg)   LMP 07/30/2015 (Approximate)   SpO2 99%   BMI 41.13 kg/m    Subjective:    Patient ID: Christy Cox, female    DOB: 1975/08/26, 44 y.o.   MRN: [id number]  HPI: Christy Cox is a 44 y.o. female  Chief Complaint  Patient presents with  . Urinary Tract Infection   Here today with ongoing bladder concerns. Following with Urology for bladder botox related to her neurogenic bladder dysfunction from [place] but feeling this is not helping at this time. Urinating constantly, can't sleep and having accidents at times. Denies fever, chills, abdominal pain, N/V/D. Long hx of recurrent UTIs which often present with leg weakness, falls.   Relevant past medical, surgical, family and social history reviewed and updated as indicated. Interim medical history since our last visit reviewed. Allergies and medications reviewed and updated.  Review of Systems  Per HPI unless specifically indicated above     Objective:    BP 107/75 (BP Location: Left Arm, Patient Position: Sitting, Cuff Size: Normal)   Pulse 69   Temp 97.8 F (36.6 C) (Oral)   Ht 5\' 6"  (1.676 m)   Wt (!) 254 lb 12.8 oz (115.6 kg)   LMP 07/30/2015 (Approximate)   SpO2 99%   BMI 41.13 kg/m   Wt Readings from Last 3 Encounters:  09/24/19 (!) 254 lb 12.8 oz (115.6 kg)  09/11/19 255 lb (115.7 kg)  07/12/19 255 lb 4 oz (115.8 kg)    Physical Exam Vitals and nursing note reviewed.  Constitutional:      Appearance: Normal appearance. She is not ill-appearing.  HENT:     Head: Atraumatic.  Eyes:     Extraocular Movements: Extraocular movements intact.     Conjunctiva/sclera: Conjunctivae normal.  Cardiovascular:     Rate and Rhythm: Normal rate and regular rhythm.     Heart sounds: Normal heart sounds.  Pulmonary:     Effort:  Pulmonary effort is normal.     Breath sounds: Normal breath sounds.  Abdominal:     General: Bowel sounds are normal. There is no distension.     Palpations: Abdomen is soft.     Tenderness: There is no abdominal tenderness. There is no right CVA tenderness, left CVA tenderness or guarding.  Musculoskeletal:        General: Normal range of motion.     Cervical back: Normal range of motion and neck supple.  Skin:    General: Skin is warm and dry.  Neurological:     Mental Status: She is alert and oriented to person, place, and time.  Psychiatric:        Mood and Affect: Mood normal.        Thought Content: Thought content normal.        Judgment: Judgment normal.     Results for orders placed or performed in visit on 09/24/19  Microscopic Examination  Result Value Ref Range   WBC, UA None seen 0 - 5 /hpf   RBC None seen 0 - 2 /hpf   Epithelial Cells (non renal) 0-10 0 - 10 /hpf   Casts None seen None seen /lpf   Bacteria, UA None seen None seen/Few  UA/M w/rflx Culture, Routine   Specimen:  Urine  Result Value Ref Range   Specific Gravity, UA 1.019 1.005 - 1.030   pH, UA 5.5 5.0 - 7.5   Color, UA Yellow Yellow   Appearance Ur Clear Clear   Leukocytes,UA Negative Negative   Protein,UA Negative Negative/Trace   Glucose, UA Negative Negative   Ketones, UA Negative Negative   RBC, UA Negative Negative   Bilirubin, UA Negative Negative   Urobilinogen, Ur 0.2 0.2 - 1.0 mg/dL   Nitrite, UA Negative Negative   Microscopic Examination Comment    Microscopic Examination See below:    Urinalysis Reflex Comment       Assessment & Plan:   Problem List Items Addressed This Visit      Genitourinary   Infection of urinary tract - Primary   Relevant Medications   sulfamethoxazole-trimethoprim (BACTRIM DS) 800-160 MG tablet   Other Relevant Orders   UA/M w/rflx Culture, Routine (Completed)       Follow up plan: Return if symptoms worsen or fail to improve.

## 2019-09-25 LAB — UA/M W/RFLX CULTURE, ROUTINE
Bilirubin, UA: NEGATIVE
Glucose, UA: NEGATIVE
Ketones, UA: NEGATIVE
Leukocytes,UA: NEGATIVE
Nitrite, UA: NEGATIVE
Protein,UA: NEGATIVE
RBC, UA: NEGATIVE
Specific Gravity, UA: 1.019 (ref 1.005–1.030)
Urobilinogen, Ur: 0.2 mg/dL (ref 0.2–1.0)
pH, UA: 5.5 (ref 5.0–7.5)

## 2019-09-25 LAB — MICROSCOPIC EXAMINATION
Bacteria, UA: NONE SEEN
Casts: NONE SEEN /lpf
RBC, Urine: NONE SEEN /hpf (ref 0–2)
WBC, UA: NONE SEEN /hpf (ref 0–5)

## 2019-10-03 DIAGNOSIS — R350 Frequency of micturition: Secondary | ICD-10-CM | POA: Diagnosis not present

## 2019-10-03 DIAGNOSIS — N390 Urinary tract infection, site not specified: Secondary | ICD-10-CM | POA: Diagnosis not present

## 2019-10-03 DIAGNOSIS — Z792 Long term (current) use of antibiotics: Secondary | ICD-10-CM | POA: Diagnosis not present

## 2019-10-15 DIAGNOSIS — R339 Retention of urine, unspecified: Secondary | ICD-10-CM | POA: Diagnosis not present

## 2019-10-17 ENCOUNTER — Telehealth: Payer: Self-pay | Admitting: Family Medicine

## 2019-10-17 NOTE — Telephone Encounter (Signed)
Requested medication (s) are due for refill today: no  Requested medication (s) are on the active medication list: yes  Last refill:  09/24/2019  Future visit scheduled: yes  Notes to clinic: this refill cannot be delegated   Requested Prescriptions  Pending Prescriptions Disp Refills   topiramate (TOPAMAX) 100 MG tablet [Pharmacy Med Name: TOPIRAMATE 100 MG TABLET] 60 tablet 1    Sig: TAKE 1 TABLET BY MOUTH TWICE A DAY      Not Delegated - Neurology: Anticonvulsants - topiramate & zonisamide Failed - 10/17/2019  8:31 AM      Failed - This refill cannot be delegated      Passed - Cr in normal range and within 360 days    Creatinine, Ser  Date Value Ref Range Status  01/02/2019 0.74 0.57 - 1.00 mg/dL Final          Passed - CO2 in normal range and within 360 days    CO2  Date Value Ref Range Status  01/02/2019 24 20 - 29 mmol/L Final          Passed - Valid encounter within last 12 months    Recent Outpatient Visits           3 weeks ago Urinary tract infection without hematuria, site unspecified   [place], [place], PA-C   1 month ago Multiple sclerosis [facility])   [place], [place], [place]   3 months ago Acute lower UTI   [facility], [place], [place]   4 months ago Class 3 severe obesity due to excess calories without serious comorbidity with body mass index (BMI) of 40.0 to 44.9 in adult [facility])   [facility] [place], [place]   5 months ago UTI symptoms   [place], Lilia Argue, [place]       Future Appointments             In 2 weeks Wynetta Emery, Barb Merino, DO [employer], [facility]

## 2019-10-17 NOTE — Telephone Encounter (Signed)
Pt stated she was not due for refills and is unsure why [facility] rent in the request, Pt stated she would call if she needed refills. Pt stated she still had enough medication. Pt verbalize and confirmed understanding.

## 2019-10-25 DIAGNOSIS — N390 Urinary tract infection, site not specified: Secondary | ICD-10-CM | POA: Diagnosis not present

## 2019-11-05 ENCOUNTER — Ambulatory Visit: Payer: Medicare Other | Admitting: Family Medicine

## 2019-11-06 DIAGNOSIS — Z9484 Stem cells transplant status: Secondary | ICD-10-CM | POA: Diagnosis not present

## 2019-11-06 DIAGNOSIS — G35 Multiple sclerosis: Secondary | ICD-10-CM | POA: Diagnosis not present

## 2019-11-07 DIAGNOSIS — G35 Multiple sclerosis: Secondary | ICD-10-CM | POA: Diagnosis not present

## 2019-11-11 DIAGNOSIS — R339 Retention of urine, unspecified: Secondary | ICD-10-CM | POA: Diagnosis not present

## 2019-11-16 ENCOUNTER — Other Ambulatory Visit: Payer: Self-pay | Admitting: Family Medicine

## 2019-11-16 NOTE — Telephone Encounter (Signed)
Requested medication (s) are due for refill today: yes  Requested medication (s) are on the active medication list: yes  Last refill:  09/24/19  Future visit scheduled: no  Notes to clinic:  med not delegated to NT to RF   Requested Prescriptions  Pending Prescriptions Disp Refills   topiramate (TOPAMAX) 100 MG tablet [Pharmacy Med Name: TOPIRAMATE 100 MG TABLET] 60 tablet 1    Sig: TAKE 1 TABLET BY MOUTH TWICE A DAY      Not Delegated - Neurology: Anticonvulsants - topiramate & zonisamide Failed - 11/16/2019 12:34 PM      Failed - This refill cannot be delegated      Passed - Cr in normal range and within 360 days    Creatinine, Ser  Date Value Ref Range Status  01/02/2019 0.74 0.57 - 1.00 mg/dL Final          Passed - CO2 in normal range and within 360 days    CO2  Date Value Ref Range Status  01/02/2019 24 20 - 29 mmol/L Final          Passed - Valid encounter within last 12 months    Recent Outpatient Visits           1 month ago Urinary tract infection without hematuria, site unspecified   [place], [place], [place]   2 months ago Multiple sclerosis [facility])   [place], [place], [place]   4 months ago Acute lower UTI   [facility], [place], [place]   5 months ago Class 3 severe obesity due to excess calories without serious comorbidity with body mass index (BMI) of 40.0 to 44.9 in adult [facility])   [facility] [place], [place]   6 months ago UTI symptoms   [facility] Mer[place] Roof [place], [place]

## 2019-11-18 NOTE — Telephone Encounter (Signed)
Called patient. She states that she does need this medication as she is completely out. Pharmacy verified with patient.

## 2019-11-18 NOTE — Telephone Encounter (Signed)
Please call patient and ask if she truly needs refill - per last telephone note she had enough of this medication and would call if she needed refill

## 2019-11-21 DIAGNOSIS — G35 Multiple sclerosis: Secondary | ICD-10-CM | POA: Diagnosis not present

## 2019-11-21 DIAGNOSIS — Z7689 Persons encountering health services in other specified circumstances: Secondary | ICD-10-CM | POA: Diagnosis not present

## 2019-11-21 DIAGNOSIS — R339 Retention of urine, unspecified: Secondary | ICD-10-CM | POA: Diagnosis not present

## 2019-11-21 DIAGNOSIS — Z9484 Stem cells transplant status: Secondary | ICD-10-CM | POA: Diagnosis not present

## 2019-12-19 ENCOUNTER — Encounter: Payer: Self-pay | Admitting: Family Medicine

## 2019-12-19 ENCOUNTER — Other Ambulatory Visit: Payer: Self-pay

## 2019-12-19 DIAGNOSIS — R339 Retention of urine, unspecified: Secondary | ICD-10-CM | POA: Diagnosis not present

## 2019-12-19 MED ORDER — TOPIRAMATE 100 MG PO TABS
100.0000 mg | ORAL_TABLET | Freq: Two times a day (BID) | ORAL | 0 refills | Status: DC
Start: 2019-12-19 — End: 2019-12-25

## 2019-12-25 ENCOUNTER — Other Ambulatory Visit: Payer: Self-pay

## 2019-12-25 ENCOUNTER — Ambulatory Visit (INDEPENDENT_AMBULATORY_CARE_PROVIDER_SITE_OTHER): Payer: BLUE CROSS/BLUE SHIELD | Admitting: Family Medicine

## 2019-12-25 ENCOUNTER — Encounter: Payer: Self-pay | Admitting: Family Medicine

## 2019-12-25 LAB — URINALYSIS, ROUTINE W REFLEX MICROSCOPIC
Bilirubin, UA: NEGATIVE
Glucose, UA: NEGATIVE
Ketones, UA: NEGATIVE
Nitrite, UA: NEGATIVE
Protein,UA: NEGATIVE
RBC, UA: NEGATIVE
Specific Gravity, UA: 1.015 (ref 1.005–1.030)
Urobilinogen, Ur: 0.2 mg/dL (ref 0.2–1.0)
pH, UA: 7 (ref 5.0–7.5)

## 2019-12-25 LAB — MICROSCOPIC EXAMINATION
Bacteria, UA: NONE SEEN
RBC, Urine: NONE SEEN /hpf (ref 0–2)

## 2019-12-25 LAB — BAYER DCA HB A1C WAIVED: HB A1C (BAYER DCA - WAIVED): 4.7 % (ref <7.0)

## 2019-12-25 MED ORDER — TOPIRAMATE 100 MG PO TABS
100.0000 mg | ORAL_TABLET | Freq: Two times a day (BID) | ORAL | 1 refills | Status: DC
Start: 2019-12-25 — End: 2020-08-19

## 2019-12-25 NOTE — Progress Notes (Signed)
BP 90/62   Pulse 80   Temp 97.8 F (36.6 C) (Oral)   Wt 257 lb 12.8 oz (116.9 kg)   LMP 07/30/2015 (Approximate)   SpO2 98%   BMI 41.61 kg/m    Subjective:    Patient ID: Christy Cox, female    DOB: 1975-03-11, 44 y.o.   MRN: [id number]  HPI: Christy Cox is a 44 y.o. female  Chief Complaint  Patient presents with  . Obesity   OBESITY- working with bariatrics, but needs to be seen monthly to document weight loss Duration: chronic Previous attempts at weight loss: topamax, keto, decreased sugar, weight watchers Complications of obesity: none Peak weight: 280lbs Weight loss goal: 180lbs Weight loss to date: 23lbs Requesting obesity pharmacotherapy: yes Current weight loss supplements/medications: yes Previous weight loss supplements/meds: yes  Relevant past medical, surgical, family and social history reviewed and updated as indicated. Interim medical history since our last visit reviewed. Allergies and medications reviewed and updated.  Review of Systems  Constitutional: Negative.   Respiratory: Negative.   Cardiovascular: Negative.   Gastrointestinal: Negative.   Musculoskeletal: Positive for arthralgias. Negative for back pain, gait problem, joint swelling, myalgias, neck pain and neck stiffness.  Neurological: Negative.   Psychiatric/Behavioral: Negative.     Per HPI unless specifically indicated above     Objective:    BP 90/62   Pulse 80   Temp 97.8 F (36.6 C) (Oral)   Wt 257 lb 12.8 oz (116.9 kg)   LMP 07/30/2015 (Approximate)   SpO2 98%   BMI 41.61 kg/m   Wt Readings from Last 3 Encounters:  12/25/19 257 lb 12.8 oz (116.9 kg)  09/24/19 (!) 254 lb 12.8 oz (115.6 kg)  09/11/19 255 lb (115.7 kg)    Physical Exam  Results for orders placed or performed in visit on 09/24/19  Microscopic Examination  Result Value Ref Range   WBC, UA None seen 0 - 5 /hpf   RBC None seen 0 - 2 /hpf   Epithelial Cells (non renal) 0-10 0 - 10  /hpf   Casts None seen None seen /lpf   Bacteria, UA None seen None seen/Few  UA/M w/rflx Culture, Routine   Specimen: Urine  Result Value Ref Range   Specific Gravity, UA 1.019 1.005 - 1.030   pH, UA 5.5 5.0 - 7.5   Color, UA Yellow Yellow   Appearance Ur Clear Clear   Leukocytes,UA Negative Negative   Protein,UA Negative Negative/Trace   Glucose, UA Negative Negative   Ketones, UA Negative Negative   RBC, UA Negative Negative   Bilirubin, UA Negative Negative   Urobilinogen, Ur 0.2 0.2 - 1.0 mg/dL   Nitrite, UA Negative Negative   Microscopic Examination Comment    Microscopic Examination See below:    Urinalysis Reflex Comment       Assessment & Plan:   Problem List Items Addressed This Visit      Other   Morbid obesity ([place]) - Primary    Will continue topamax. To meet with dietician on 11/30- already scheduled. Will work on eating 3 small meals a day rather than 1 large meal in the evening. Goal of walking 15-20 minutes 3x a week. Continue to follow with bariatrics as scheduled. Checking labs today as screening labs have not been done in over 2 years. Await results. Treat as needed. Recheck 1 month.       Relevant Orders   CBC with Differential/Platelet   Comprehensive metabolic panel  Lipid Panel w/o Chol/HDL Ratio   TSH   Urinalysis, Routine w reflex microscopic   Bayer DCA Hb A1c Waived       Follow up plan: Return in about 4 weeks (around 01/22/2020).

## 2019-12-25 NOTE — Assessment & Plan Note (Signed)
Will continue topamax. To meet with dietician on 11/30- already scheduled. Will work on eating 3 small meals a day rather than 1 large meal in the evening. Goal of walking 15-20 minutes 3x a week. Continue to follow with bariatrics as scheduled. Checking labs today as screening labs have not been done in over 2 years. Await results. Treat as needed. Recheck 1 month.

## 2019-12-26 LAB — CBC WITH DIFFERENTIAL/PLATELET
Basophils Absolute: 0 10*3/uL (ref 0.0–0.2)
Basos: 1 %
EOS (ABSOLUTE): 0.2 10*3/uL (ref 0.0–0.4)
Eos: 5 %
Hematocrit: 38 % (ref 34.0–46.6)
Hemoglobin: 12.2 g/dL (ref 11.1–15.9)
Immature Grans (Abs): 0 10*3/uL (ref 0.0–0.1)
Immature Granulocytes: 0 %
Lymphocytes Absolute: 1.4 10*3/uL (ref 0.7–3.1)
Lymphs: 33 %
MCH: 30.5 pg (ref 26.6–33.0)
MCHC: 32.1 g/dL (ref 31.5–35.7)
MCV: 95 fL (ref 79–97)
Monocytes Absolute: 0.3 10*3/uL (ref 0.1–0.9)
Monocytes: 7 %
Neutrophils Absolute: 2.3 10*3/uL (ref 1.4–7.0)
Neutrophils: 54 %
Platelets: 134 10*3/uL — ABNORMAL LOW (ref 150–450)
RBC: 4 x10E6/uL (ref 3.77–5.28)
RDW: 14.3 % (ref 11.7–15.4)
WBC: 4.3 10*3/uL (ref 3.4–10.8)

## 2019-12-26 LAB — COMPREHENSIVE METABOLIC PANEL
ALT: 15 IU/L (ref 0–32)
AST: 18 IU/L (ref 0–40)
Albumin/Globulin Ratio: 1.8 (ref 1.2–2.2)
Albumin: 4.3 g/dL (ref 3.8–4.8)
Alkaline Phosphatase: 77 IU/L (ref 44–121)
BUN/Creatinine Ratio: 11 (ref 9–23)
BUN: 10 mg/dL (ref 6–24)
Bilirubin Total: 0.3 mg/dL (ref 0.0–1.2)
CO2: 21 mmol/L (ref 20–29)
Calcium: 9.3 mg/dL (ref 8.7–10.2)
Chloride: 108 mmol/L — ABNORMAL HIGH (ref 96–106)
Creatinine, Ser: 0.93 mg/dL (ref 0.57–1.00)
GFR calc Af Amer: 86 mL/min/{1.73_m2} (ref >59)
GFR calc non Af Amer: 75 mL/min/{1.73_m2} (ref >59)
Globulin, Total: 2.4 g/dL (ref 1.5–4.5)
Glucose: 70 mg/dL (ref 65–99)
Potassium: 4.2 mmol/L (ref 3.5–5.2)
Sodium: 143 mmol/L (ref 134–144)
Total Protein: 6.7 g/dL (ref 6.0–8.5)

## 2019-12-26 LAB — TSH: TSH: 4.45 u[IU]/mL (ref 0.450–4.500)

## 2019-12-26 LAB — LIPID PANEL W/O CHOL/HDL RATIO
Cholesterol, Total: 156 mg/dL (ref 100–199)
HDL: 45 mg/dL (ref >39)
LDL Chol Calc (NIH): 91 mg/dL (ref 0–99)
Triglycerides: 112 mg/dL (ref 0–149)
VLDL Cholesterol Cal: 20 mg/dL (ref 5–40)

## 2020-01-20 DIAGNOSIS — R339 Retention of urine, unspecified: Secondary | ICD-10-CM | POA: Diagnosis not present

## 2020-01-27 ENCOUNTER — Encounter: Payer: Self-pay | Admitting: Family Medicine

## 2020-01-27 ENCOUNTER — Ambulatory Visit (INDEPENDENT_AMBULATORY_CARE_PROVIDER_SITE_OTHER): Payer: BLUE CROSS/BLUE SHIELD | Admitting: Family Medicine

## 2020-01-27 ENCOUNTER — Other Ambulatory Visit: Payer: Self-pay

## 2020-01-27 NOTE — Assessment & Plan Note (Signed)
Weight up 1 lb from last visit. To see dietician tomorrow. Will continue working on eating 3 small meals a day rather than 1 large meal in the evening. Goal of walking 15-20 minutes 3x a week as was not able to do this comfortably. Goal of increasing fitness with comfort in walking 15 minutes. Continue to follow with bariatrics as scheduled. Labs normal last visit. Recheck 1 month.

## 2020-01-27 NOTE — Progress Notes (Signed)
BP 91/61   Pulse 77   Temp (!) 97.4 F (36.3 C) (Oral)   Ht 5' 6.46" (1.688 m)   Wt 258 lb 12.8 oz (117.4 kg)   LMP 07/30/2015 (Approximate)   SpO2 100%   BMI 41.20 kg/m    Subjective:    Patient ID: Christy Cox, female    DOB: [birth year], 44 y.o.   MRN: [id number]  HPI: Christy Cox is a 44 y.o. female  Chief Complaint  Patient presents with  . Obesity    wants toa have    Has had a bunch of birthdays and Thanksgiving this month. She feels like she hasn't been doing well with her diet. She is pleased that she has only gained 1lb and feels like that is reasonable given the month.  She has not changed her eating habits. 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 She has not figured out how to change her diet due to being very busy and not having time to cook. She is seeing the dietician tomorrow. Her cousin got her a blender, but she has not started using it yet. She is hoping that she can do some protein shakes in the AM.  She notes that her legs have been very tired and feeling really heavy. She was able to walk 3x a week for 3/4 weeks. This past week, her legs were really bothering her. She notes that she was feeling OK, she didn't feel great as she was doing it. She was able to walk about 15 minutes holding onto a stroller.  She is otherwise feeling well. No other concerns or complaints at this time.   Relevant past medical, surgical, family and social history reviewed and updated as indicated. Interim medical history since our last visit reviewed. Allergies and medications reviewed and updated.  Review of Systems  Constitutional: Negative.   Respiratory: Negative.   Cardiovascular: Negative.   Gastrointestinal: Negative.   Musculoskeletal: Negative.   Neurological: Negative.   Psychiatric/Behavioral: Negative.     Per HPI unless specifically indicated above     Objective:    BP 91/61   Pulse 77   Temp (!) 97.4 F (36.3 C)  (Oral)   Ht 5' 6.46" (1.688 m)   Wt 258 lb 12.8 oz (117.4 kg)   LMP 07/30/2015 (Approximate)   SpO2 100%   BMI 41.20 kg/m   Wt Readings from Last 3 Encounters:  01/27/20 258 lb 12.8 oz (117.4 kg)  12/25/19 257 lb 12.8 oz (116.9 kg)  09/24/19 (!) 254 lb 12.8 oz (115.6 kg)    Physical Exam Vitals and nursing note reviewed.  Constitutional:      General: She is not in acute distress.    Appearance: Normal appearance. She is obese. She is not ill-appearing, toxic-appearing or diaphoretic.  HENT:     Head: Normocephalic and atraumatic.     Right Ear: External ear normal.     Left Ear: External ear normal.     Nose: Nose normal.     Mouth/Throat:     Mouth: Mucous membranes are moist.     Pharynx: Oropharynx is clear.  Eyes:     General: No scleral icterus.       Right eye: No discharge.        Left eye: No discharge.     Extraocular Movements: Extraocular movements intact.     Conjunctiva/sclera: Conjunctivae normal.     Pupils: Pupils are equal, round, and reactive to  light.  Cardiovascular:     Rate and Rhythm: Normal rate and regular rhythm.     Pulses: Normal pulses.     Heart sounds: Normal heart sounds. No murmur heard.  No friction rub. No gallop.   Pulmonary:     Effort: Pulmonary effort is normal. No respiratory distress.     Breath sounds: Normal breath sounds. No stridor. No wheezing, rhonchi or rales.  Chest:     Chest wall: No tenderness.  Musculoskeletal:        General: Normal range of motion.     Cervical back: Normal range of motion and neck supple.  Skin:    General: Skin is warm and dry.     Capillary Refill: Capillary refill takes less than 2 seconds.     Coloration: Skin is not jaundiced or pale.     Findings: No bruising, erythema, lesion or rash.  Neurological:     General: No focal deficit present.     Mental Status: She is alert and oriented to person, place, and time. Mental status is at baseline.  Psychiatric:        Mood and Affect: Mood  normal.        Behavior: Behavior normal.        Thought Content: Thought content normal.        Judgment: Judgment normal.     Results for orders placed or performed in visit on 12/25/19  Microscopic Examination   Urine  Result Value Ref Range   WBC, UA 0-5 0 - 5 /hpf   RBC None seen 0 - 2 /hpf   Epithelial Cells (non renal) 0-10 0 - 10 /hpf   Bacteria, UA None seen None seen/Few  CBC with Differential/Platelet  Result Value Ref Range   WBC 4.3 3.4 - 10.8 x10E3/uL   RBC 4.00 3.77 - 5.28 x10E6/uL   Hemoglobin 12.2 11.1 - 15.9 g/dL   Hematocrit 38.0 34.0 - 46.6 %   MCV 95 79 - 97 fL   MCH 30.5 26.6 - 33.0 pg   MCHC 32.1 31 - 35 g/dL   RDW 14.3 11.7 - 15.4 %   Platelets 134 (L) 150 - 450 x10E3/uL   Neutrophils 54 Not Estab. %   Lymphs 33 Not Estab. %   Monocytes 7 Not Estab. %   Eos 5 Not Estab. %   Basos 1 Not Estab. %   Neutrophils Absolute 2.3 1.40 - 7.00 x10E3/uL   Lymphocytes Absolute 1.4 0 - 3 x10E3/uL   Monocytes Absolute 0.3 0 - 0 x10E3/uL   EOS (ABSOLUTE) 0.2 0.0 - 0.4 x10E3/uL   Basophils Absolute 0.0 0 - 0 x10E3/uL   Immature Granulocytes 0 Not Estab. %   Immature Grans (Abs) 0.0 0.0 - 0.1 x10E3/uL  Comprehensive metabolic panel  Result Value Ref Range   Glucose 70 65 - 99 mg/dL   BUN 10 6 - 24 mg/dL   Creatinine, Ser 0.93 0.57 - 1.00 mg/dL   GFR calc non Af Amer 75 >59 mL/min/1.73   GFR calc Af Amer 86 >59 mL/min/1.73   BUN/Creatinine Ratio 11 9 - 23   Sodium 143 134 - 144 mmol/L   Potassium 4.2 3.5 - 5.2 mmol/L   Chloride 108 (H) 96 - 106 mmol/L   CO2 21 20 - 29 mmol/L   Calcium 9.3 8.7 - 10.2 mg/dL   Total Protein 6.7 6.0 - 8.5 g/dL   Albumin 4.3 3.8 - 4.8 g/dL   Globulin, Total 2.4 1.5 -  4.5 g/dL   Albumin/Globulin Ratio 1.8 1.2 - 2.2   Bilirubin Total 0.3 0.0 - 1.2 mg/dL   Alkaline Phosphatase 77 44 - 121 IU/L   AST 18 0 - 40 IU/L   ALT 15 0 - 32 IU/L  Lipid Panel w/o Chol/HDL Ratio  Result Value Ref Range   Cholesterol, Total 156 100 - 199  mg/dL   Triglycerides 112 0 - 149 mg/dL   HDL 45 >39 mg/dL   VLDL Cholesterol Cal 20 5 - 40 mg/dL   LDL Chol Calc (NIH) 91 0 - 99 mg/dL  TSH  Result Value Ref Range   TSH 4.450 0.450 - 4.500 uIU/mL  Urinalysis, Routine w reflex microscopic  Result Value Ref Range   Specific Gravity, UA 1.015 1.005 - 1.030   pH, UA 7.0 5.0 - 7.5   Color, UA Yellow Yellow   Appearance Ur Clear Clear   Leukocytes,UA Trace (A) Negative   Protein,UA Negative Negative/Trace   Glucose, UA Negative Negative   Ketones, UA Negative Negative   RBC, UA Negative Negative   Bilirubin, UA Negative Negative   Urobilinogen, Ur 0.2 0.2 - 1.0 mg/dL   Nitrite, UA Negative Negative   Microscopic Examination See below:   Bayer DCA Hb A1c Waived  Result Value Ref Range   HB A1C (BAYER DCA - WAIVED) 4.7 <7.0 %      Assessment & Plan:   Problem List Items Addressed This Visit      Other   Morbid obesity ([place]) - Primary    Weight up 1 lb from last visit. To see dietician tomorrow. Will continue working on eating 3 small meals a day rather than 1 large meal in the evening. Goal of walking 15-20 minutes 3x a week as was not able to do this comfortably. Goal of increasing fitness with comfort in walking 15 minutes. Continue to follow with bariatrics as scheduled. Labs normal last visit. Recheck 1 month.           Follow up plan: Return in about 4 weeks (around 02/24/2020).

## 2020-02-17 DIAGNOSIS — R350 Frequency of micturition: Secondary | ICD-10-CM | POA: Diagnosis not present

## 2020-02-24 DIAGNOSIS — R339 Retention of urine, unspecified: Secondary | ICD-10-CM | POA: Diagnosis not present

## 2020-02-26 ENCOUNTER — Ambulatory Visit: Payer: BLUE CROSS/BLUE SHIELD | Admitting: Family Medicine

## 2020-02-27 ENCOUNTER — Ambulatory Visit (INDEPENDENT_AMBULATORY_CARE_PROVIDER_SITE_OTHER): Payer: BLUE CROSS/BLUE SHIELD | Admitting: Family Medicine

## 2020-02-27 ENCOUNTER — Other Ambulatory Visit: Payer: Self-pay

## 2020-02-27 ENCOUNTER — Encounter: Payer: Self-pay | Admitting: Family Medicine

## 2020-02-27 NOTE — Assessment & Plan Note (Signed)
Weight up 1lb since last month but also wasn't as committed to goals over the holidays. Will maintain same goals of eating 3 small meals a day rather than 1 large meal in the evening and walking 15-20 minutes 3x a week. Continue to follow with bariatrics and dietician. F/u in one month.

## 2020-02-27 NOTE — Progress Notes (Signed)
   SUBJECTIVE:   CHIEF COMPLAINT / HPI:   Patient Active Problem List   Diagnosis Date Noted  . Recurrent UTI 02/03/2018  . Strain of trapezius muscle 12/06/2017  . 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  . Morbid obesity (HCC) 09/30/2014  . Overactive bladder 05/24/2013  . Incomplete bladder emptying 05/24/2013  . Urge incontinence 05/24/2013  . Frequency of micturition 05/24/2013  . Urinary retention 05/24/2013  . Multiple sclerosis (HCC) 05/31/2012  . DS (disseminated sclerosis) (HCC) 05/31/2012   WEIGHT GAIN - following with Bariatric Surgery and dietician at [facility] - on topamax 100mg  BID, no issues.  - Previous goals: 3 small meals a day rather than 1 large meal in the evening. Goal of walking 15-20 minutes 3x a week - exercise difficult due to MS. Walks as much as she can.  - this month hasn't been as diligent due to holidays. Has had better efforts with eating smaller meals. Doesn't eat much meat. Likes fruits and vegetables.  - works at night for , difficult to keep regular meal times.   OBJECTIVE:   BP 93/67   Pulse 87   Temp 97.9 F (36.6 C)   Ht 5' 5.75" (1.67 m)   Wt 259 lb (117.5 kg)   LMP 07/30/2015 (Approximate)   SpO2 97%   BMI 42.12 kg/m   Gen: overweight, in NAD Card: RRR Lungs: CTAB Ext: WWP, no edema  ASSESSMENT/PLAN:   Morbid obesity (HCC) Weight up 1lb since last month but also wasn't as committed to goals over the holidays. Will maintain same goals of eating 3 small meals a day rather than 1 large meal in the evening and walking 15-20 minutes 3x a week. Continue to follow with bariatrics and dietician. F/u in one month.   09/29/2015, DO

## 2020-03-04 ENCOUNTER — Encounter: Payer: Self-pay | Admitting: Family Medicine

## 2020-03-04 ENCOUNTER — Ambulatory Visit (INDEPENDENT_AMBULATORY_CARE_PROVIDER_SITE_OTHER): Payer: BLUE CROSS/BLUE SHIELD | Admitting: Family Medicine

## 2020-03-04 ENCOUNTER — Other Ambulatory Visit: Payer: Self-pay

## 2020-03-04 VITALS — BP 114/64 | HR 67 | Temp 98.0°F | Wt 259.0 lb

## 2020-03-04 DIAGNOSIS — R300 Dysuria: Secondary | ICD-10-CM | POA: Diagnosis not present

## 2020-03-04 LAB — URINALYSIS, ROUTINE W REFLEX MICROSCOPIC
Bilirubin, UA: NEGATIVE
Glucose, UA: NEGATIVE
Ketones, UA: NEGATIVE
Leukocytes,UA: NEGATIVE
Nitrite, UA: NEGATIVE
Protein,UA: NEGATIVE
RBC, UA: NEGATIVE
Specific Gravity, UA: 1.02 (ref 1.005–1.030)
Urobilinogen, Ur: 0.2 mg/dL (ref 0.2–1.0)
pH, UA: 7 (ref 5.0–7.5)

## 2020-03-04 MED ORDER — CIPROFLOXACIN HCL 500 MG PO TABS: 500.0000 mg | ORAL_TABLET | Freq: Two times a day (BID) | ORAL | 0 refills | Status: DC

## 2020-03-04 NOTE — Progress Notes (Signed)
BP 114/64   Pulse 67   Temp 98 F (36.7 C)   Wt 259 lb (117.5 kg)   LMP 07/30/2015 (Approximate)   SpO2 97%   BMI 42.12 kg/m    Subjective:    Patient ID: Christy Cox, female    DOB: Jul 08, 1975, 45 y.o.   MRN: [id number]  HPI: Christy Cox is a 45 y.o. female  Chief Complaint  Patient presents with  . Urinary Tract Infection    Pt is having concerns for uti, frequent urination.    URINARY SYMPTOMS Duration: couple of days Dysuria: burning Urinary frequency: yes Urgency: yes Small volume voids: yes Symptom severity: moderate Urinary incontinence: yes Foul odor: yes Hematuria: no Abdominal pain: yes Back pain: no Suprapubic pain/pressure: yes Flank pain: no Fever:  no Vomiting: no Relief with cranberry juice: no Relief with pyridium: no Status: better/worse/stable Previous urinary tract infection: yes Recurrent urinary tract infection: yes Sexual activity: monogomous History of sexually transmitted disease: no Vaginal discharge: no Treatments attempted: antibiotics and increasing fluids    Relevant past medical, surgical, family and social history reviewed and updated as indicated. Interim medical history since our last visit reviewed. Allergies and medications reviewed and updated.  Review of Systems  Constitutional: Negative.   Respiratory: Negative.   Cardiovascular: Negative.   Gastrointestinal: Negative.   Genitourinary: Positive for dysuria, frequency and urgency. Negative for decreased urine volume, difficulty urinating, dyspareunia, enuresis, flank pain, genital sores, hematuria, menstrual problem, pelvic pain, vaginal bleeding, vaginal discharge and vaginal pain.  Musculoskeletal: Negative.   Psychiatric/Behavioral: Negative.     Per HPI unless specifically indicated above     Objective:    BP 114/64   Pulse 67   Temp 98 F (36.7 C)   Wt 259 lb (117.5 kg)   LMP 07/30/2015 (Approximate)   SpO2 97%   BMI 42.12 kg/m    Wt Readings from Last 3 Encounters:  03/04/20 259 lb (117.5 kg)  02/27/20 259 lb (117.5 kg)  01/27/20 258 lb 12.8 oz (117.4 kg)    Physical Exam Vitals and nursing note reviewed.  Constitutional:      General: She is not in acute distress.    Appearance: Normal appearance. She is not ill-appearing, toxic-appearing or diaphoretic.  HENT:     Head: Normocephalic and atraumatic.     Right Ear: External ear normal.     Left Ear: External ear normal.     Nose: Nose normal.     Mouth/Throat:     Mouth: Mucous membranes are moist.     Pharynx: Oropharynx is clear.  Eyes:     General: No scleral icterus.       Right eye: No discharge.        Left eye: No discharge.     Extraocular Movements: Extraocular movements intact.     Conjunctiva/sclera: Conjunctivae normal.     Pupils: Pupils are equal, round, and reactive to light.  Cardiovascular:     Rate and Rhythm: Normal rate and regular rhythm.     Pulses: Normal pulses.     Heart sounds: Normal heart sounds. No murmur heard. No friction rub. No gallop.   Pulmonary:     Effort: Pulmonary effort is normal. No respiratory distress.     Breath sounds: Normal breath sounds. No stridor. No wheezing, rhonchi or rales.  Chest:     Chest wall: No tenderness.  Musculoskeletal:        General: Normal range of motion.  Cervical back: Normal range of motion and neck supple.  Skin:    General: Skin is warm and dry.     Capillary Refill: Capillary refill takes less than 2 seconds.     Coloration: Skin is not jaundiced or pale.     Findings: No bruising, erythema, lesion or rash.  Neurological:     General: No focal deficit present.     Mental Status: She is alert and oriented to person, place, and time. Mental status is at baseline.  Psychiatric:        Mood and Affect: Mood normal.        Behavior: Behavior normal.        Thought Content: Thought content normal.        Judgment: Judgment normal.     Results for orders placed or  performed in visit on 03/04/20  Urinalysis, Routine w reflex microscopic  Result Value Ref Range   Specific Gravity, UA 1.020 1.005 - 1.030   pH, UA 7.0 5.0 - 7.5   Color, UA Yellow Yellow   Appearance Ur Clear Clear   Leukocytes,UA Negative Negative   Protein,UA Negative Negative/Trace   Glucose, UA Negative Negative   Ketones, UA Negative Negative   RBC, UA Negative Negative   Bilirubin, UA Negative Negative   Urobilinogen, Ur 0.2 0.2 - 1.0 mg/dL   Nitrite, UA Negative Negative      Assessment & Plan:   Problem List Items Addressed This Visit   None   Visit Diagnoses    Dysuria    -  Primary   ?Partially treated due to chronic bactrim use. Will treat with 3 days of cipro based on symptoms and check urine Cx. Await results. Call if not getting better.    Relevant Orders   Urinalysis, Routine w reflex microscopic (Completed)   Urine Culture       Follow up plan: Return if symptoms worsen or fail to improve.

## 2020-03-06 ENCOUNTER — Encounter: Payer: Self-pay | Admitting: Family Medicine

## 2020-03-06 LAB — URINE CULTURE

## 2020-03-27 ENCOUNTER — Other Ambulatory Visit: Payer: Self-pay

## 2020-03-27 ENCOUNTER — Ambulatory Visit (INDEPENDENT_AMBULATORY_CARE_PROVIDER_SITE_OTHER): Payer: BLUE CROSS/BLUE SHIELD | Admitting: Family Medicine

## 2020-03-27 ENCOUNTER — Encounter: Payer: Self-pay | Admitting: Family Medicine

## 2020-03-27 DIAGNOSIS — M778 Other enthesopathies, not elsewhere classified: Secondary | ICD-10-CM | POA: Diagnosis not present

## 2020-03-27 MED ORDER — DICLOFENAC SODIUM 1 % EX GEL: 4.0000 g | Freq: Four times a day (QID) | CUTANEOUS | 3 refills | Status: DC

## 2020-03-27 NOTE — Progress Notes (Signed)
BP 104/73   Pulse 80   Temp 98.1 F (36.7 C)   Ht 5' 6.69" (1.694 m)   Wt 258 lb 4 oz (117.1 kg)   LMP 07/30/2015 (Approximate)   SpO2 97%   BMI 40.82 kg/m    Subjective:    Patient ID: Christy Cox, female    DOB: 1976-01-21, 45 y.o.   MRN: [id number]  HPI: Christy Cox is a 45 y.o. female  Chief Complaint  Patient presents with  . Weight Check  . Wrist Pain    left   Here today for follow up on her weight due to planning on having bariatric surgery. This is month 4 that she has been seen to document her weight loss journey. She has lost 1lb since her last visit. She notes that she was out of town for an extended period and has had issues with the weather. She is joining a walking group at [employer]. Planning to walking in the mall 2x a week. She has been doing well. She has done a support group with the dietitian through the bariatrics. She is planning on doing more. She felt like it was very helpful. She is happy with how it's going. She is still working with bariatrics and is feeling well.   She notes that her L wrist has been hurting for about 2 weeks. She has been typing a lot. She notes that it's aching constantly in the on the top of her wrist and radiating into her forearm. She has trouble lifting and twisting. No numbness or tingling. No injuries. She is otherwise feeling well with no other concerns or complaints at this time.    Relevant past medical, surgical, family and social history reviewed and updated as indicated. Interim medical history since our last visit reviewed. Allergies and medications reviewed and updated.  Review of Systems  Constitutional: Negative.   Respiratory: Negative.   Cardiovascular: Negative.   Gastrointestinal: Negative.   Musculoskeletal: Positive for arthralgias. Negative for back pain, gait problem, joint swelling, myalgias, neck pain and neck stiffness.  Neurological: Negative.   Psychiatric/Behavioral: Negative.      Per HPI unless specifically indicated above     Objective:    BP 104/73   Pulse 80   Temp 98.1 F (36.7 C)   Ht 5' 6.69" (1.694 m)   Wt 258 lb 4 oz (117.1 kg)   LMP 07/30/2015 (Approximate)   SpO2 97%   BMI 40.82 kg/m   Wt Readings from Last 3 Encounters:  03/27/20 258 lb 4 oz (117.1 kg)  03/04/20 259 lb (117.5 kg)  02/27/20 259 lb (117.5 kg)    Physical Exam Vitals and nursing note reviewed.  Constitutional:      General: She is not in acute distress.    Appearance: Normal appearance. She is not ill-appearing, toxic-appearing or diaphoretic.  HENT:     Head: Normocephalic and atraumatic.     Right Ear: External ear normal.     Left Ear: External ear normal.     Nose: Nose normal.     Mouth/Throat:     Mouth: Mucous membranes are moist.     Pharynx: Oropharynx is clear.  Eyes:     General: No scleral icterus.       Right eye: No discharge.        Left eye: No discharge.     Extraocular Movements: Extraocular movements intact.     Conjunctiva/sclera: Conjunctivae normal.     Pupils: Pupils are  equal, round, and reactive to light.  Cardiovascular:     Rate and Rhythm: Normal rate and regular rhythm.     Pulses: Normal pulses.     Heart sounds: Normal heart sounds. No murmur heard. No friction rub. No gallop.   Pulmonary:     Effort: Pulmonary effort is normal. No respiratory distress.     Breath sounds: Normal breath sounds. No stridor. No wheezing, rhonchi or rales.  Chest:     Chest wall: No tenderness.  Musculoskeletal:        General: Tenderness present. Normal range of motion.     Cervical back: Normal range of motion and neck supple.     Comments: Tenderness along dorsal aspect of L wrist, FROM  Skin:    General: Skin is warm and dry.     Capillary Refill: Capillary refill takes less than 2 seconds.     Coloration: Skin is not jaundiced or pale.     Findings: No bruising, erythema, lesion or rash.  Neurological:     General: No focal deficit  present.     Mental Status: She is alert and oriented to person, place, and time. Mental status is at baseline.  Psychiatric:        Mood and Affect: Mood normal.        Behavior: Behavior normal.        Thought Content: Thought content normal.        Judgment: Judgment normal.     Results for orders placed or performed in visit on 03/04/20  Urine Culture   Specimen: Urine   UR  Result Value Ref Range   Urine Culture, Routine Final report    Organism ID, Bacteria Comment   Urinalysis, Routine w reflex microscopic  Result Value Ref Range   Specific Gravity, UA 1.020 1.005 - 1.030   pH, UA 7.0 5.0 - 7.5   Color, UA Yellow Yellow   Appearance Ur Clear Clear   Leukocytes,UA Negative Negative   Protein,UA Negative Negative/Trace   Glucose, UA Negative Negative   Ketones, UA Negative Negative   RBC, UA Negative Negative   Bilirubin, UA Negative Negative   Urobilinogen, Ur 0.2 0.2 - 1.0 mg/dL   Nitrite, UA Negative Negative      Assessment & Plan:   Problem List Items Addressed This Visit      Other   Morbid obesity ([place]) - Primary    Down 1 lb in the last month. Continuing to work with bariatrics. Will increase her exercise with goal of walking 2x a week in the mall. Continue to monitor. Call with any concerns.        Other Visit Diagnoses    Wrist tendonitis       Will treat with voltaren, stretches and brace. Call if not getting better or getting worse. Continue to monitor.        Follow up plan: Return in about 4 weeks (around 04/24/2020).

## 2020-03-27 NOTE — Patient Instructions (Signed)
Wrist Sprain Rehab Ask your health care provider which exercises are safe for you. Do exercises exactly as told by your health care provider and adjust them as directed. It is normal to feel mild stretching, pulling, tightness, or discomfort as you do these exercises. Stop right away if you feel sudden pain or your pain gets worse. Do not begin these exercises until told by your health care provider. Stretching and range-of-motion exercises These exercises warm up your muscles and joints and improve the movement and flexibility of your wrist. These exercises also help to relieve pain, numbness, and tingling. Wrist flexion range of motion 1. Bend your left / right elbow to a 90-degree angle (right angle) with your palm facing the floor. 2. Bend your wrist forward so your fingers point toward the floor (flexion). 3. Hold this position for __________ seconds. 4. Slowly return to the starting position. Repeat __________ times. Complete this exercise __________ times a day. Wrist extension range of motion 1. Bend your left / right elbow to a 90-degree angle (right angle) with your palm facing the floor. 2. Bend your wrist backward so your fingers point toward the ceiling (extension). 3. Hold this position for __________ seconds. 4. Slowly return to the starting position. Repeat __________ times. Complete this exercise __________ times a day. Ulnar deviation range of motion 1. Bend your left / right elbow to a 90-degree angle (right angle), and rest your forearm on a table with your palm facing down. 2. Keeping your hand flat on the table, bend your left / right wrist toward your small finger (pinkie). This is ulnar deviation. 3. Hold this position for __________ seconds. 4. Slowly return your palm to the starting position. Repeat __________ times. Complete this exercise __________ times a day. Radial deviation range of motion 1. Bend your left / right elbow to a 90-degree angle (right angle), and  rest your forearm on a table with your palm facing down. 2. Keeping your hand flat on the table, bend your left / right wrist toward your thumb. This is radial deviation. 3. Hold this position for __________ seconds. 4. Slowly return your palm to the starting position. Repeat __________ times. Complete this exercise __________ times a day. Dart-thrower's motion This exercise combines all four wrist motions--wrist flexion, wrist extension, ulnar deviation, and radial deviation--into one exercise. The motion is similar to throwing a dart. 1. Sit and rest your left / right elbow on a table. Start with your wrist straight. Keep your fingers relaxed during the exercise. 2. In the same motion, bend your wrist backward (extension) and toward your thumb (radial deviation). Move slowly and go as far as you can. 3. Then, in the same motion, bend your wrist forward (flexion) and toward your small (pinkie) finger (ulnar deviation). Move slowly and go as far as you can. 4. Slowly return to the starting position. Repeat __________ times. Complete this exercise __________ times a day. Wrist flexion stretch 1. Extend your left / right arm in front of you, and turn your palm down toward the floor. ? If told by your health care provider, bend your left / right elbow to a 90-degree angle (right angle) at your side. 2. Using your uninjured hand, gently press over the back of your left / right hand to bend your wrist and fingers toward the floor (flexion). Go as far as you can to feel a stretch without causing pain. 3. Hold this position for __________ seconds. 4. Slowly return to the starting position. Repeat  __________ times. Complete this exercise __________ times a day.   Wrist extension stretch 1. Extend your left / right arm in front of you and turn your palm up toward the ceiling. ? If told by your health care provider, bend your left / right elbow to a 90-degree angle (right angle) at your side. 2. Using  your uninjured hand, gently press over the palm of your left / right hand to bend your wrist and fingers toward the floor (extension). Go as far as you can to feel a stretch without causing pain. 3. Hold this position for __________ seconds. 4. Slowly return to the starting position. Repeat __________ times. Complete this exercise __________ times a day.   Ulnar deviation stretch 1. Bend your left / right elbow to a 90-degree angle (right angle). Rest your forearm, wrist, and hand on a table with your palm facing the floor. 2. Place your uninjured hand over the top of your left / right hand to keep it from moving during the exercise. 3. Gently move your left / right elbow away from your body. This will turn your wrist toward your small finger (pinkie). This is ulnar deviation. 4. Hold this position for __________ seconds. 5. Slowly return to the starting position. Repeat __________ times. Complete this exercise __________ times a day. Radial deviation stretch 1. Bend your left / right elbow to a 90-degree angle (right angle). Rest your forearm, wrist, and hand on a table with your palm facing the floor. 2. Place your uninjured hand over the top of your left / right hand to keep it from moving during the exercise. 3. Gently move your left / right elbow toward your body. This will turn your wrist toward your thumb (radial deviation). 4. Hold this position for __________ seconds. 5. Slowly return to the starting position. Repeat __________ times. Complete this exercise __________ times a day. Strengthening exercises These exercises build strength and endurance in your wrist. Endurance is the ability to use your muscles for a long time, even after they get tired. The first 4 exercises are isometric, which means creating tension in a muscle or group of muscles without moving the joint. Isometric wrist flexion 1. Bend your left / right elbow to a 90-degree angle (right angle). Rest your forearm,  wrist, and hand on a table with your palm facing the floor. 2. Without moving any joints (isometric), tighten your muscles as if you are trying to bend (flex) your wrist. ? Start with gentle effort and increase your effort as tolerated. 3. Hold this position for __________ seconds. Then relax your muscles. Repeat __________ times. Complete this exercise __________ times a day. Isometric wrist extension 1. Bend your left / right elbow to a 90-degree angle (right angle). Rest your forearm, wrist, and hand on a table with your palm facing the floor. Place your unaffected hand over the top of the left / right hand. 2. Without moving any joints (isometric), tighten your muscles to lift your left / right hand off the table (extension). Use the unaffected hand to keep the left / right hand on the table. ? Start with gentle effort and increase your effort as tolerated. 3. Hold this position for __________ seconds. Then relax your muscles. Repeat __________ times. Complete this exercise __________ times a day. Isometric ulnar deviation 1. Bend your left / right elbow to a 90-degree angle (right angle). Rest your forearm, wrist, and hand on a table with your thumb facing the ceiling (neutral position). Make a  light fist with your left / right fingers. 2. Without moving any joints (isometric), tighten your muscles to press the side of your left / right fist into the tabletop (ulnar deviation). ? Start with gentle effort and increase your effort as tolerated. 3. Hold this position for __________ seconds. Then relax your muscles. Repeat __________ times. Complete this exercise __________ times a day. Isometric radial deviation 1. Bend your left / right elbow to a 90-degree angle (right angle). Rest your forearm, wrist, and hand on a table with your thumb facing the ceiling (neutral position). 2. Make a light fist with your left / right fingers. Place your unaffected hand over the top of your fist. 3. Without  moving any joints (isometric), tighten your muscles to lift your left / right fist off the table (radial deviation). Use the unaffected hand to keep the left / right hand on the table. ? Start with gentle effort and increase your effort as tolerated. 4. Hold this position for __________ seconds. Then relax your muscles. Repeat __________ times. Complete this exercise __________ times a day. Wrist flexion 1. Sit with your left / right forearm supported on a table or other surface. Bend your elbow to a 90-degree angle (right angle), and rest your hand palm-up over the edge of the table. 2. Hold a __________ weight in your left / right hand. Or, hold an exercise band or tube in both hands, keeping your hands at the same level and hip distance apart. There should be a slight tension in the exercise band or tube. 3. Slowly curl your hand up toward the ceiling (flexion). 4. Hold this position for __________ seconds. 5. Slowly lower your hand back to the starting position. Repeat __________ times. Complete this exercise __________ times a day.   Wrist extension 1. Sit with your left / right forearm supported on a table or other surface. Bend your elbow to a 90-degree angle (right angle), and rest your hand palm-down over the edge of the table. 2. Hold a __________ weight in your left / right hand. Or, hold an exercise band or tube in both hands, keeping your hands at the same level and hip distance apart. There should be a slight tension in the exercise band or tube. 3. Slowly curl your hand up toward the ceiling (extension). 4. Hold this position for __________ seconds. 5. Slowly lower your hand back to the starting position. Repeat __________ times. Complete this exercise __________ times a day.   This information is not intended to replace advice given to you by your health care provider. Make sure you discuss any questions you have with your health care provider. Document Revised: 06/27/2019 Document  Reviewed: 06/27/2019 Elsevier Patient Education  [place].

## 2020-03-27 NOTE — Assessment & Plan Note (Signed)
Down 1 lb in the last month. Continuing to work with bariatrics. Will increase her exercise with goal of [place]. Continue to monitor. Call with any concerns.

## 2020-04-01 IMAGING — DX DG CHEST 2V
2 series · 2 of 2 positions shown · non-contrast
Comparison: 07/22/2008

CLINICAL DATA: Productive cough for 1 week

EXAM:
CHEST - 2 VIEW

[chest pa]
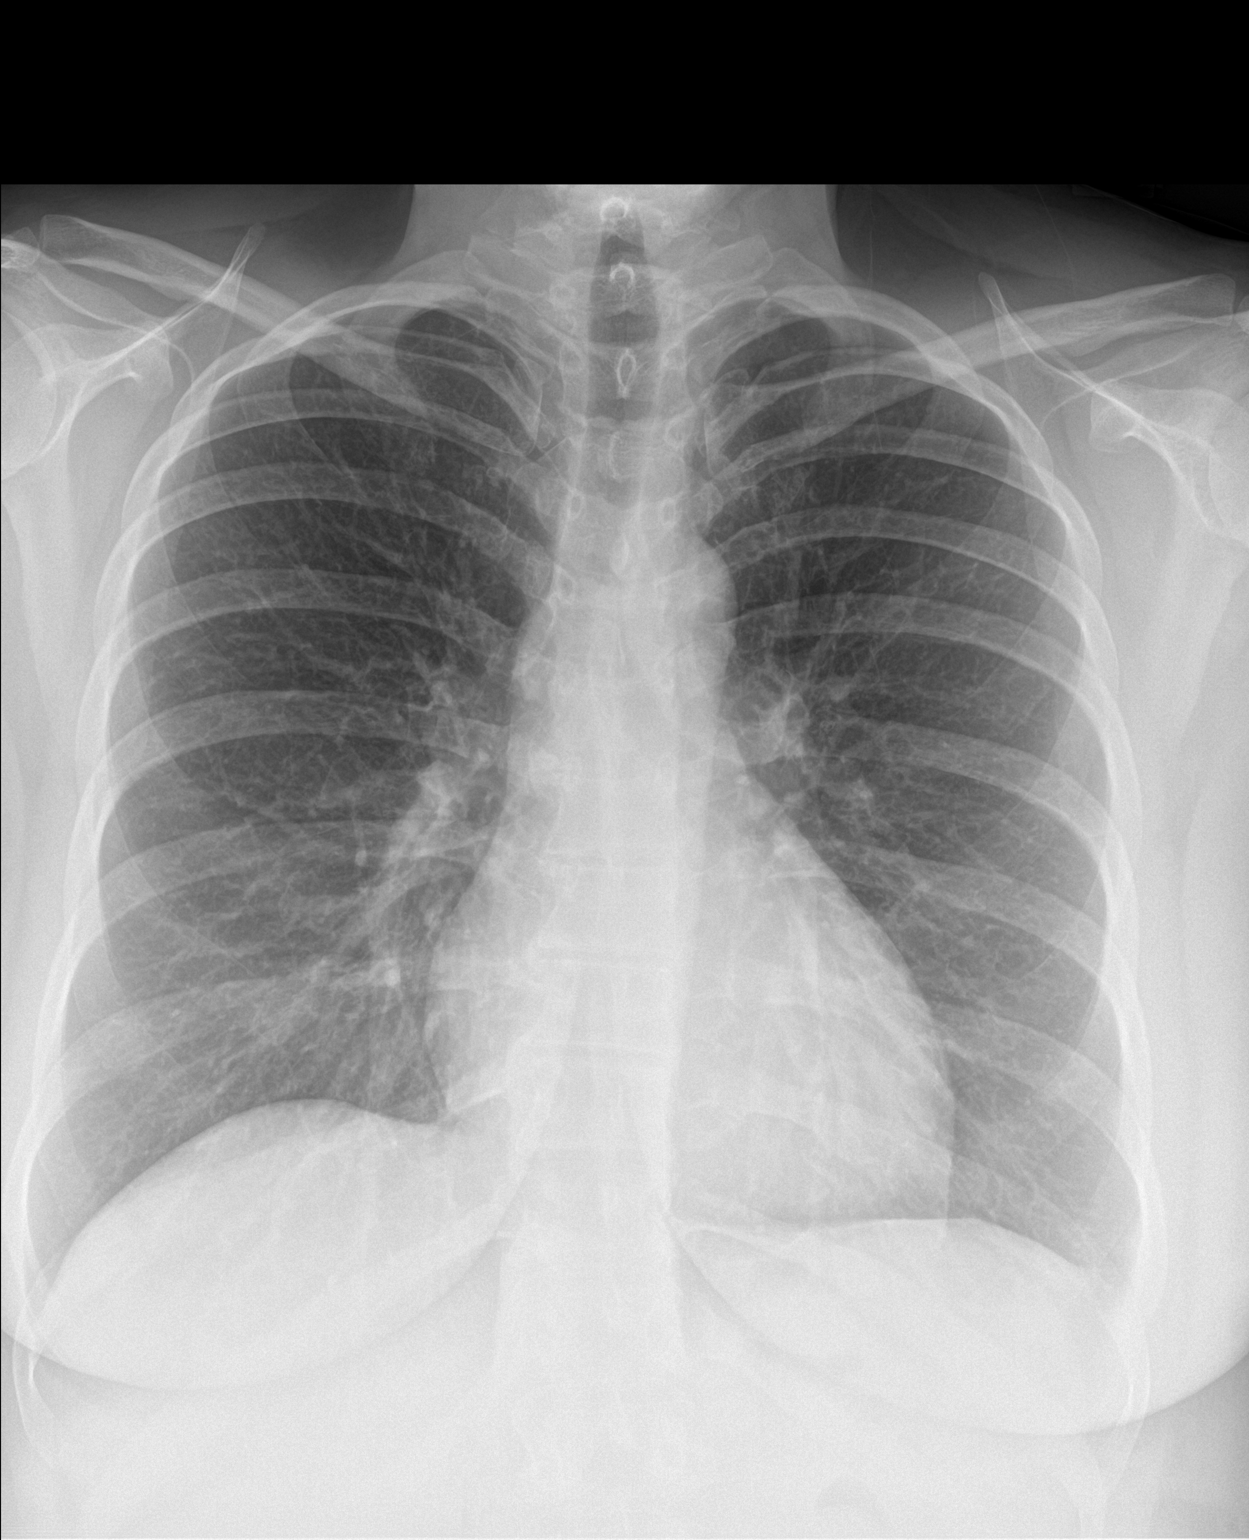

[chest lat]
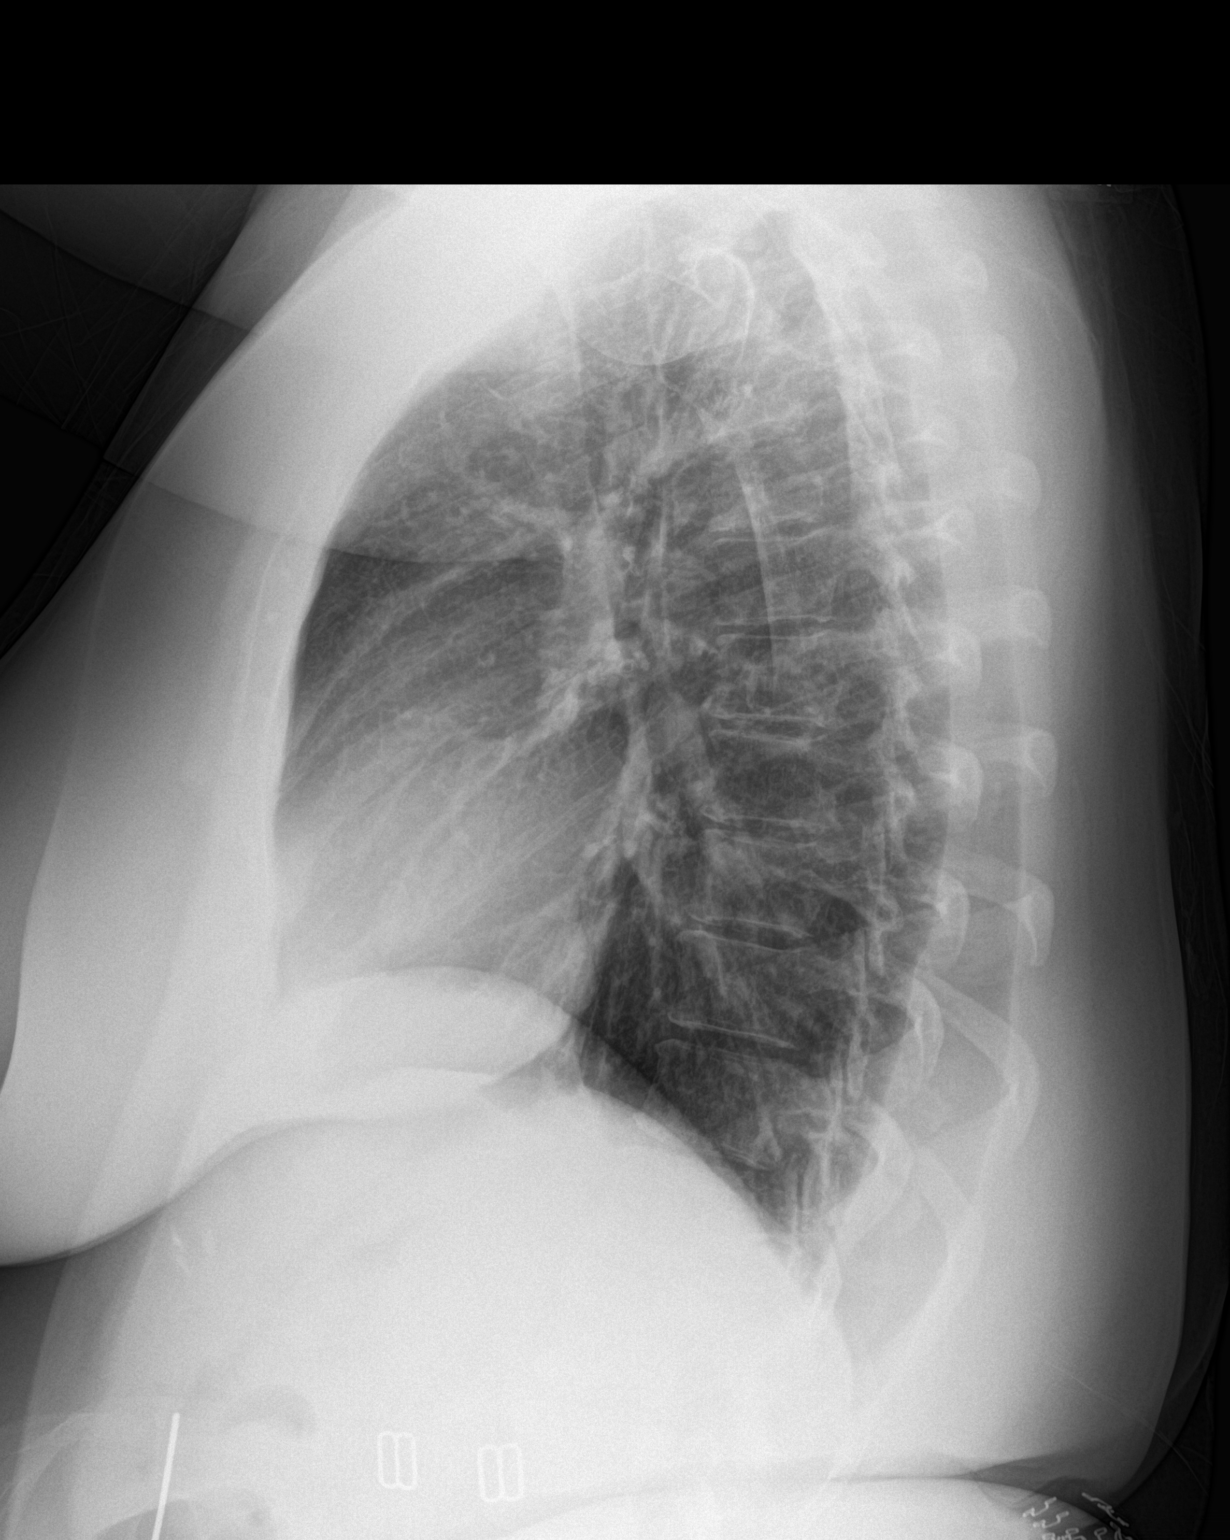

[2 of 2 positions shown; findings below may reference images not displayed]

FINDINGS: The heart size and mediastinal contours are within normal limits.
Both lungs are clear. The visualized skeletal structures are
unremarkable.
IMPRESSION: No active cardiopulmonary disease.

## 2020-04-27 ENCOUNTER — Encounter: Payer: Self-pay | Admitting: Family Medicine

## 2020-04-27 ENCOUNTER — Other Ambulatory Visit: Payer: Self-pay

## 2020-04-27 ENCOUNTER — Ambulatory Visit (INDEPENDENT_AMBULATORY_CARE_PROVIDER_SITE_OTHER): Payer: BLUE CROSS/BLUE SHIELD | Admitting: Family Medicine

## 2020-04-27 DIAGNOSIS — R300 Dysuria: Secondary | ICD-10-CM

## 2020-04-27 LAB — URINALYSIS, ROUTINE W REFLEX MICROSCOPIC
Bilirubin, UA: NEGATIVE
Glucose, UA: NEGATIVE
Ketones, UA: NEGATIVE
Leukocytes,UA: NEGATIVE
Nitrite, UA: NEGATIVE
Protein,UA: NEGATIVE
RBC, UA: NEGATIVE
Specific Gravity, UA: 1.025 (ref 1.005–1.030)
Urobilinogen, Ur: 0.2 mg/dL (ref 0.2–1.0)
pH, UA: 6.5 (ref 5.0–7.5)

## 2020-04-27 NOTE — Assessment & Plan Note (Signed)
Down another pound in the last month. Has been able to keep to her goal of eating 3 small meals a day and walking 52minutes 2x a week. Due to get some testing done with bariatrics. Call with any concerns. Recheck 1 month.

## 2020-04-27 NOTE — Progress Notes (Signed)
BP 93/64   Pulse 66   Temp 97.6 F (36.4 C)   Wt 257 lb (116.6 kg)   LMP 07/30/2015 (Approximate)   SpO2 100%   BMI 40.62 kg/m    Subjective:    Patient ID: Christy Cox, female    DOB: October 28, 1975, 45 y.o.   MRN: [id number]  HPI: Christy Cox is a 45 y.o. female  Chief Complaint  Patient presents with  . morbid obesity    Follow up    Following with bariatrics. She has 1 more month of monitored weight loss before she can discuss surgery. She has has some testing that needs to be done with bariatrics- a chest x-ray, EKG and some labs. She has been keeping well with her diet. She has been able to keep to eating 3 small meals a day. She has been able to do the walking 2x a week. Her legs have been very tired, but she has been able to keep to it.   URINARY SYMPTOMS Duration: chronic- worse in the past couple of days Dysuria: burning Urinary frequency: yes Urgency: yes Small volume voids: yes Symptom severity: mild Urinary incontinence: no Foul odor: no Hematuria: no Abdominal pain: no Back pain: no Suprapubic pain/pressure: yes Flank pain: no Fever:  no Vomiting: no Relief with cranberry juice: no Relief with pyridium: no Status: stable Previous urinary tract infection: yes Recurrent urinary tract infection: no History of sexually transmitted disease: no Vaginal discharge: no Treatments attempted: increasing fluids    Relevant past medical, surgical, family and social history reviewed and updated as indicated. Interim medical history since our last visit reviewed. Allergies and medications reviewed and updated.  Review of Systems  Constitutional: Negative.   Respiratory: Negative.   Cardiovascular: Negative.   Gastrointestinal: Negative.   Musculoskeletal: Negative.   Psychiatric/Behavioral: Negative.     Per HPI unless specifically indicated above     Objective:    BP 93/64   Pulse 66   Temp 97.6 F (36.4 C)   Wt 257 lb (116.6  kg)   LMP 07/30/2015 (Approximate)   SpO2 100%   BMI 40.62 kg/m   Wt Readings from Last 3 Encounters:  04/27/20 257 lb (116.6 kg)  03/27/20 258 lb 4 oz (117.1 kg)  03/04/20 259 lb (117.5 kg)    Physical Exam Vitals and nursing note reviewed.  Constitutional:      General: She is not in acute distress.    Appearance: Normal appearance. She is obese. She is not ill-appearing, toxic-appearing or diaphoretic.  HENT:     Head: Normocephalic and atraumatic.     Right Ear: External ear normal.     Left Ear: External ear normal.     Nose: Nose normal.     Mouth/Throat:     Mouth: Mucous membranes are moist.     Pharynx: Oropharynx is clear.  Eyes:     General: No scleral icterus.       Right eye: No discharge.        Left eye: No discharge.     Extraocular Movements: Extraocular movements intact.     Conjunctiva/sclera: Conjunctivae normal.     Pupils: Pupils are equal, round, and reactive to light.  Cardiovascular:     Rate and Rhythm: Normal rate and regular rhythm.     Pulses: Normal pulses.     Heart sounds: Normal heart sounds. No murmur heard. No friction rub. No gallop.   Pulmonary:     Effort: Pulmonary effort  is normal. No respiratory distress.     Breath sounds: Normal breath sounds. No stridor. No wheezing, rhonchi or rales.  Chest:     Chest wall: No tenderness.  Musculoskeletal:        General: Normal range of motion.     Cervical back: Normal range of motion and neck supple.  Skin:    General: Skin is warm and dry.     Capillary Refill: Capillary refill takes less than 2 seconds.     Coloration: Skin is not jaundiced or pale.     Findings: No bruising, erythema, lesion or rash.  Neurological:     General: No focal deficit present.     Mental Status: She is alert and oriented to person, place, and time. Mental status is at baseline.  Psychiatric:        Mood and Affect: Mood normal.        Behavior: Behavior normal.        Thought Content: Thought content  normal.        Judgment: Judgment normal.     Results for orders placed or performed in visit on 03/04/20  Urine Culture   Specimen: Urine   UR  Result Value Ref Range   Urine Culture, Routine Final report    Organism ID, Bacteria Comment   Urinalysis, Routine w reflex microscopic  Result Value Ref Range   Specific Gravity, UA 1.020 1.005 - 1.030   pH, UA 7.0 5.0 - 7.5   Color, UA Yellow Yellow   Appearance Ur Clear Clear   Leukocytes,UA Negative Negative   Protein,UA Negative Negative/Trace   Glucose, UA Negative Negative   Ketones, UA Negative Negative   RBC, UA Negative Negative   Bilirubin, UA Negative Negative   Urobilinogen, Ur 0.2 0.2 - 1.0 mg/dL   Nitrite, UA Negative Negative      Assessment & Plan:   Problem List Items Addressed This Visit      Other   Morbid obesity ([place]) - Primary    Down another pound in the last month. Has been able to keep to her goal of eating 3 small meals a day and walking 33minutes 2x a week. Due to get some testing done with bariatrics. Call with any concerns. Recheck 1 month.        Other Visit Diagnoses    Dysuria       Will check UA. Await results. Treat as needed.    Relevant Orders   Urinalysis, Routine w reflex microscopic       Follow up plan: Return in about 4 weeks (around 05/25/2020).

## 2020-05-25 ENCOUNTER — Other Ambulatory Visit: Payer: Self-pay

## 2020-05-25 ENCOUNTER — Ambulatory Visit (INDEPENDENT_AMBULATORY_CARE_PROVIDER_SITE_OTHER): Payer: BLUE CROSS/BLUE SHIELD | Admitting: Family Medicine

## 2020-05-25 ENCOUNTER — Encounter: Payer: Self-pay | Admitting: Family Medicine

## 2020-05-25 VITALS — BP 95/64 | HR 77 | Wt 259.0 lb

## 2020-05-25 DIAGNOSIS — R300 Dysuria: Secondary | ICD-10-CM

## 2020-05-25 NOTE — Assessment & Plan Note (Signed)
Up 2 pounds in the last month. Has been able to keep to her goal of eating 3 small meals a day, but her fridge has been broken and so she has been eating out significantly more. She has been able to keep to walking 68minutes 2x a week. She still needs to have testing done with bariatrics. Has completed 6 months of 1 month follow ups. Continue to follow with bariatrics. Call with any concerns.

## 2020-05-25 NOTE — Progress Notes (Signed)
BP 95/64   Pulse 77   Wt 259 lb (117.5 kg)   LMP 07/30/2015 (Approximate)   SpO2 97%   BMI 40.94 kg/m    Subjective:    Patient ID: Christy Cox, female    DOB: 1976-02-18, 45 y.o.   MRN: [id number]  HPI: Christy Cox is a 45 y.o. female  Chief Complaint  Patient presents with  . Morbid Obesity    Follow up    [name] presents today for follow up on her weight. She is in her last month of month of monitored weight loss prior to going for bariatric surgery. She feels like she has been doing OK, but not great. The fridge at her house is broken and she has not been able to get it fixed, so she has not been able to cook at home. She has been able to keep to eating 3 small meals a day. She has been able to do her walking 15 minutes 2x a week. She notes that her legs feel very tired when she does this, but she has been able to keep it up. She has not done her testing through the bariatric clinic yet and needs to schedule it. She has been feeling well.   She is to be having botox for her bladder on 4/8- needs to have a UA and culture before the procedure. Would like to have UA and culture here to avoid going to [facility]. She continues with issues with her bladder that urology is dealing with. No other concerns or complaints at this time.   Relevant past medical, surgical, family and social history reviewed and updated as indicated. Interim medical history since our last visit reviewed. Allergies and medications reviewed and updated.  Review of Systems  Constitutional: Negative.   Respiratory: Negative.   Cardiovascular: Negative.   Gastrointestinal: Negative.   Genitourinary: Positive for dysuria, frequency and urgency. Negative for decreased urine volume, difficulty urinating, dyspareunia, enuresis, flank pain, genital sores, hematuria, menstrual problem, pelvic pain, vaginal bleeding, vaginal discharge and vaginal pain.  Musculoskeletal: Negative.   Neurological:  Negative.   Psychiatric/Behavioral: Negative.     Per HPI unless specifically indicated above     Objective:    BP 95/64   Pulse 77   Wt 259 lb (117.5 kg)   LMP 07/30/2015 (Approximate)   SpO2 97%   BMI 40.94 kg/m   Wt Readings from Last 3 Encounters:  05/25/20 259 lb (117.5 kg)  04/27/20 257 lb (116.6 kg)  03/27/20 258 lb 4 oz (117.1 kg)    Physical Exam Vitals and nursing note reviewed.  Constitutional:      General: She is not in acute distress.    Appearance: Normal appearance. She is obese. She is not ill-appearing, toxic-appearing or diaphoretic.  HENT:     Head: Normocephalic and atraumatic.     Right Ear: External ear normal.     Left Ear: External ear normal.     Nose: Nose normal.     Mouth/Throat:     Mouth: Mucous membranes are moist.     Pharynx: Oropharynx is clear.  Eyes:     General: No scleral icterus.       Right eye: No discharge.        Left eye: No discharge.     Extraocular Movements: Extraocular movements intact.     Conjunctiva/sclera: Conjunctivae normal.     Pupils: Pupils are equal, round, and reactive to light.  Cardiovascular:  Rate and Rhythm: Normal rate and regular rhythm.     Pulses: Normal pulses.     Heart sounds: Normal heart sounds. No murmur heard. No friction rub. No gallop.   Pulmonary:     Effort: Pulmonary effort is normal. No respiratory distress.     Breath sounds: Normal breath sounds. No stridor. No wheezing, rhonchi or rales.  Chest:     Chest wall: No tenderness.  Musculoskeletal:        General: Normal range of motion.     Cervical back: Normal range of motion and neck supple.  Skin:    General: Skin is warm and dry.     Capillary Refill: Capillary refill takes less than 2 seconds.     Coloration: Skin is not jaundiced or pale.     Findings: No bruising, erythema, lesion or rash.  Neurological:     General: No focal deficit present.     Mental Status: She is alert and oriented to person, place, and time.  Mental status is at baseline.  Psychiatric:        Mood and Affect: Mood normal.        Behavior: Behavior normal.        Thought Content: Thought content normal.        Judgment: Judgment normal.     Results for orders placed or performed in visit on 04/27/20  Urinalysis, Routine w reflex microscopic  Result Value Ref Range   Specific Gravity, UA 1.025 1.005 - 1.030   pH, UA 6.5 5.0 - 7.5   Color, UA Yellow Yellow   Appearance Ur Clear Clear   Leukocytes,UA Negative Negative   Protein,UA Negative Negative/Trace   Glucose, UA Negative Negative   Ketones, UA Negative Negative   RBC, UA Negative Negative   Bilirubin, UA Negative Negative   Urobilinogen, Ur 0.2 0.2 - 1.0 mg/dL   Nitrite, UA Negative Negative      Assessment & Plan:   Problem List Items Addressed This Visit      Other   Morbid obesity ([place])    Up 2 pounds in the last month. Has been able to keep to her goal of eating 3 small meals a day, but her fridge has been broken and so she has been eating out significantly more. She has been able to keep to walking 55minutes 2x a week. She still needs to have testing done with bariatrics. Has completed 6 months of 1 month follow ups. Continue to follow with bariatrics. Call with any concerns.        Other Visit Diagnoses    Dysuria    -  Primary   Christy Cox needs a Urine culture prior to her botox through urology. Will collect today. Await results.    Relevant Orders   Urinalysis, Routine w reflex microscopic   Urine Culture       Follow up plan: Return in about 6 months (around 11/25/2020) for physical with PCP.

## 2020-05-26 LAB — URINALYSIS, ROUTINE W REFLEX MICROSCOPIC
Bilirubin, UA: NEGATIVE
Glucose, UA: NEGATIVE
Ketones, UA: NEGATIVE
Leukocytes,UA: NEGATIVE
Nitrite, UA: NEGATIVE
Protein,UA: NEGATIVE
RBC, UA: NEGATIVE
Specific Gravity, UA: >1.03 — ABNORMAL HIGH (ref 1.005–1.030)
Urobilinogen, Ur: 0.2 mg/dL (ref 0.2–1.0)
pH, UA: 6.5 (ref 5.0–7.5)

## 2020-05-27 LAB — URINE CULTURE: Organism ID, Bacteria: NO GROWTH

## 2020-06-11 DIAGNOSIS — N3281 Overactive bladder: Secondary | ICD-10-CM | POA: Diagnosis not present

## 2020-06-11 DIAGNOSIS — R350 Frequency of micturition: Secondary | ICD-10-CM | POA: Diagnosis not present

## 2020-06-19 DIAGNOSIS — R339 Retention of urine, unspecified: Secondary | ICD-10-CM | POA: Diagnosis not present

## 2020-06-25 DIAGNOSIS — I499 Cardiac arrhythmia, unspecified: Secondary | ICD-10-CM | POA: Diagnosis not present

## 2020-06-25 DIAGNOSIS — Z01818 Encounter for other preprocedural examination: Secondary | ICD-10-CM | POA: Diagnosis not present

## 2020-07-03 DIAGNOSIS — N3281 Overactive bladder: Secondary | ICD-10-CM | POA: Diagnosis not present

## 2020-07-16 ENCOUNTER — Other Ambulatory Visit: Payer: Self-pay | Admitting: Family Medicine

## 2020-07-16 DIAGNOSIS — Z1231 Encounter for screening mammogram for malignant neoplasm of breast: Secondary | ICD-10-CM

## 2020-07-21 ENCOUNTER — Other Ambulatory Visit: Payer: Self-pay

## 2020-07-21 ENCOUNTER — Ambulatory Visit
Admission: RE | Admit: 2020-07-21 | Discharge: 2020-07-21 | Disposition: A | Discharge status: Home / Self Care | Payer: BLUE CROSS/BLUE SHIELD | Source: Ambulatory Visit | Attending: Family Medicine | Admitting: Family Medicine

## 2020-07-21 DIAGNOSIS — Z1231 Encounter for screening mammogram for malignant neoplasm of breast: Secondary | ICD-10-CM | POA: Diagnosis present

## 2020-07-22 DIAGNOSIS — R339 Retention of urine, unspecified: Secondary | ICD-10-CM | POA: Diagnosis not present

## 2020-07-27 ENCOUNTER — Other Ambulatory Visit: Payer: Self-pay | Admitting: Family Medicine

## 2020-08-04 DIAGNOSIS — J209 Acute bronchitis, unspecified: Secondary | ICD-10-CM | POA: Diagnosis not present

## 2020-08-07 ENCOUNTER — Encounter: Payer: Self-pay | Admitting: Family Medicine

## 2020-08-07 ENCOUNTER — Telehealth: Payer: BLUE CROSS/BLUE SHIELD | Admitting: Family Medicine

## 2020-08-07 ENCOUNTER — Telehealth (INDEPENDENT_AMBULATORY_CARE_PROVIDER_SITE_OTHER): Payer: BLUE CROSS/BLUE SHIELD | Admitting: Family Medicine

## 2020-08-07 DIAGNOSIS — J209 Acute bronchitis, unspecified: Secondary | ICD-10-CM | POA: Diagnosis not present

## 2020-08-07 MED ORDER — BENZONATATE 200 MG PO CAPS: 200.0000 mg | ORAL_CAPSULE | Freq: Two times a day (BID) | ORAL | 0 refills | Status: DC | PRN

## 2020-08-07 MED ORDER — AZITHROMYCIN 250 MG PO TABS: ORAL_TABLET | ORAL | 0 refills | Status: AC

## 2020-08-07 MED ORDER — PREDNISONE 50 MG PO TABS: 50.0000 mg | ORAL_TABLET | Freq: Every day | ORAL | 0 refills | Status: DC

## 2020-08-07 NOTE — Progress Notes (Signed)
LMP 07/30/2015 (Approximate)    Subjective:    Patient ID: Christy Cox, female    DOB: 1975/11/19, 45 y.o.   MRN: [id number]  HPI: Christy Cox is a 45 y.o. female  Chief Complaint  Patient presents with   Cough    Patient states she has been coughing and been congested for about 2 weeks. Patient tested negative for COVID twice. [facility] and states she is on day 4 of prednisone and taking dayquil and nyquil every day.    UPPER RESPIRATORY TRACT INFECTION Duration: 2 weeks Worst symptom: cough and congestion Fever: no Cough: yes Shortness of breath: no Wheezing: yes Chest pain: yes, with cough Chest tightness: no Chest congestion: yes Nasal congestion: no Runny nose: no Post nasal drip: no Sneezing: no Sore throat: no Swollen glands: no Sinus pressure: no Headache: no Face pain: no Toothache: no Ear pain: no  Ear pressure: no  Eyes red/itching:no Eye drainage/crusting: no  Vomiting: no Rash: no Fatigue: yes Sick contacts: no Strep contacts: no  Context: stable Recurrent sinusitis: no Relief with OTC cold/cough medications: no  Treatments attempted: cold/sinus, mucinex, anti-histamine, pseudoephedrine, and cough syrup    Relevant past medical, surgical, family and social history reviewed and updated as indicated. Interim medical history since our last visit reviewed. Allergies and medications reviewed and updated.  Review of Systems  Constitutional:  Positive for fatigue. Negative for activity change, appetite change, chills, diaphoresis, fever and unexpected weight change.  HENT:  Negative for congestion, dental problem, drooling, ear discharge, ear pain, facial swelling, hearing loss, mouth sores, nosebleeds, postnasal drip, rhinorrhea, sinus pressure, sinus pain, sneezing, sore throat, tinnitus, trouble swallowing and voice change.   Respiratory:  Positive for cough, shortness of breath and wheezing. Negative for  apnea, choking, chest tightness and stridor.   Cardiovascular: Negative.   Gastrointestinal: Negative.   Skin: Negative.   Psychiatric/Behavioral: Negative.     Per HPI unless specifically indicated above     Objective:    LMP 07/30/2015 (Approximate)   Wt Readings from Last 3 Encounters:  05/25/20 259 lb (117.5 kg)  04/27/20 257 lb (116.6 kg)  03/27/20 258 lb 4 oz (117.1 kg)    Physical Exam Vitals and nursing note reviewed.  Pulmonary:     Effort: Pulmonary effort is normal. No respiratory distress.     Comments: Speaking in full sentences Neurological:     Mental Status: She is alert.  Psychiatric:        Mood and Affect: Mood normal.        Behavior: Behavior normal.        Thought Content: Thought content normal.        Judgment: Judgment normal.    Results for orders placed or performed in visit on 05/25/20  Urine Culture   Specimen: Urine   UR  Result Value Ref Range   Urine Culture, Routine Final report    Organism ID, Bacteria No growth   Urinalysis, Routine w reflex microscopic  Result Value Ref Range   Specific Gravity, UA >1.030 (H) 1.005 - 1.030   pH, UA 6.5 5.0 - 7.5   Color, UA Yellow Yellow   Appearance Ur Clear Clear   Leukocytes,UA Negative Negative   Protein,UA Negative Negative/Trace   Glucose, UA Negative Negative   Ketones, UA Negative Negative   RBC, UA Negative Negative   Bilirubin, UA Negative Negative   Urobilinogen, Ur 0.2 0.2 - 1.0 mg/dL  Nitrite, UA Negative Negative      Assessment & Plan:   Problem List Items Addressed This Visit   None Visit Diagnoses     Acute bronchitis, unspecified organism    -  Primary   Will treat with prednisone and tessalon. If not bettter by the end of the weekend, start azithromycin. Call with any concerns.         Follow up plan: Return if symptoms worsen or fail to improve.    This visit was completed via telephone due to the restrictions of the COVID-19 pandemic. All issues as above  were discussed and addressed but no physical exam was performed. If it was felt that the patient should be evaluated in the office, they were directed there. The patient verbally consented to this visit. Patient was unable to complete an audio/visual visit due to Lack of equipment. Due to the catastrophic nature of the COVID-19 pandemic, this visit was done through audio contact only. Location of the patient: home Location of the provider: work Those involved with this call:  Provider: Park Liter, DO CMA: Louanna Raw, [place] Desk/Registration: Jill Side  Time spent on call:  15 minutes on the phone discussing health concerns. 23 minutes total spent in review of patient's record and preparation of their chart.

## 2020-08-19 ENCOUNTER — Other Ambulatory Visit: Payer: Self-pay | Admitting: Family Medicine

## 2020-08-19 NOTE — Telephone Encounter (Signed)
Pt is scheduled for cpe 9/28 per last office note however she states that she has roughly 22-24 pills left.

## 2020-08-19 NOTE — Telephone Encounter (Signed)
Requested medication (s) are due for refill today: yes  Requested medication (s) are on the active medication list: yes   Last refill: 05/23/2020  Future visit scheduled: no  Notes to clinic: this refill cannot be delegated    Requested Prescriptions  Pending Prescriptions Disp Refills   topiramate (TOPAMAX) 100 MG tablet [Pharmacy Med Name: TOPIRAMATE 100 MG TABLET] 180 tablet 1    Sig: TAKE 1 TABLET BY MOUTH TWICE A DAY      Not Delegated - Neurology: Anticonvulsants - topiramate & zonisamide Failed - 08/19/2020 11:44 AM      Failed - This refill cannot be delegated      Passed - Cr in normal range and within 360 days    Creatinine, Ser  Date Value Ref Range Status  12/25/2019 0.93 0.57 - 1.00 mg/dL Final          Passed - CO2 in normal range and within 360 days    CO2  Date Value Ref Range Status  12/25/2019 21 20 - 29 mmol/L Final          Passed - Valid encounter within last 12 months    Recent Outpatient Visits           1 week ago Acute bronchitis, unspecified organism   [place], [place], DO   2 months ago [place], Megan P, DO   3 months ago Morbid obesity ([place])   [place], Megan P, DO   4 months ago Morbid obesity ([place])   [place], Megan P, DO   5 months ago [place], [place], DO

## 2020-08-21 ENCOUNTER — Encounter: Payer: Self-pay | Admitting: Family Medicine

## 2020-08-21 ENCOUNTER — Encounter: Payer: Self-pay | Admitting: Nurse Practitioner

## 2020-08-21 ENCOUNTER — Telehealth (INDEPENDENT_AMBULATORY_CARE_PROVIDER_SITE_OTHER): Payer: BLUE CROSS/BLUE SHIELD | Admitting: Nurse Practitioner

## 2020-08-21 ENCOUNTER — Other Ambulatory Visit: Payer: Self-pay

## 2020-08-21 DIAGNOSIS — R339 Retention of urine, unspecified: Secondary | ICD-10-CM | POA: Diagnosis not present

## 2020-08-21 NOTE — Progress Notes (Signed)
LMP 07/30/2015 (Approximate)    Subjective:    Patient ID: Kathrynn Ducking, female    DOB: 1975-08-29, 45 y.o.   MRN: [id number]  HPI: JARYIAH MEHLMAN is a 45 y.o. female  Chief Complaint  Patient presents with   Obesity    Needs a letter for bariatric clinic   Patient seen today to discuss letter that she will need stating she was seen by Dr. Park Liter for 6 consecutive months for weight loss.  Patient has tried [employer], [employer], Saxenda, and Topamax to aid in her weight loss journey without success.  Letter will need to be sent to her surgeron at [facility].   Denies HA, CP, SOB, dizziness, palpitations, visual changes, and lower extremity swelling.  Relevant past medical, surgical, family and social history reviewed and updated as indicated. Interim medical history since our last visit reviewed. Allergies and medications reviewed and updated.  Review of Systems  Eyes:  Negative for visual disturbance.  Respiratory:  Negative for cough, chest tightness and shortness of breath.   Cardiovascular:  Negative for chest pain, palpitations and leg swelling.  Neurological:  Negative for dizziness and headaches.   Per HPI unless specifically indicated above     Objective:    LMP 07/30/2015 (Approximate)   Wt Readings from Last 3 Encounters:  05/25/20 259 lb (117.5 kg)  04/27/20 257 lb (116.6 kg)  03/27/20 258 lb 4 oz (117.1 kg)    Physical Exam Vitals and nursing note reviewed.  HENT:     Head: Normocephalic.     Right Ear: Hearing normal.     Left Ear: Hearing normal.     Nose: Nose normal.  Eyes:     Pupils: Pupils are equal, round, and reactive to light.  Pulmonary:     Effort: Pulmonary effort is normal. No respiratory distress.  Neurological:     Mental Status: She is alert.  Psychiatric:        Mood and Affect: Mood normal.        Behavior: Behavior normal.        Thought Content: Thought content normal.        Judgment: Judgment normal.    Results for orders placed  or performed in visit on 05/25/20  Urine Culture   Specimen: Urine   UR  Result Value Ref Range   Urine Culture, Routine Final report    Organism ID, Bacteria No growth   Urinalysis, Routine w reflex microscopic  Result Value Ref Range   Specific Gravity, UA >1.030 (H) 1.005 - 1.030   pH, UA 6.5 5.0 - 7.5   Color, UA Yellow Yellow   Appearance Ur Clear Clear   Leukocytes,UA Negative Negative   Protein,UA Negative Negative/Trace   Glucose, UA Negative Negative   Ketones, UA Negative Negative   RBC, UA Negative Negative   Bilirubin, UA Negative Negative   Urobilinogen, Ur 0.2 0.2 - 1.0 mg/dL   Nitrite, UA Negative Negative      Assessment & Plan:   Problem List Items Addressed This Visit       Other   Morbid obesity ([place]) - Primary    Patient completed 6 consecutive months of appointments with Dr. Park Liter.  [facility] is requiring a letter to be written that states patient was seen at our office for these visits.  Letter should also include the therapies patient attempted to help her lose weight prior to bariatric surgery.  Letter will be written for patient.  Follow up plan: Return if symptoms worsen or fail to improve.   This visit was completed via MyChart due to the restrictions of the COVID-19 pandemic. All issues as above were discussed and addressed. Physical exam was done as above through visual confirmation on MyChart. If it was felt that the patient should be evaluated in the office, they were directed there. The patient verbally consented to this visit. Location of the patient: Home Location of the provider: Office Those involved with this call:  Provider: Langley Gauss CMA: Tiffany Reel, CMA Front Desk/Registration: Jill Side Time spent on call: 15 minutes with patient face to face via video conference. More than 50% of this time was spent in counseling and coordination of care. 20 minutes total spent in review of patient's record and  preparation of their chart.

## 2020-08-23 ENCOUNTER — Encounter: Payer: Self-pay | Admitting: Nurse Practitioner

## 2020-08-23 NOTE — Assessment & Plan Note (Signed)
Patient completed 6 consecutive months of appointments with Dr. Park Liter.  [facility] is requiring a letter to be written that states patient was seen at our office for these visits.  Letter should also include the therapies patient attempted to help her lose weight prior to bariatric surgery.  Letter will be written for patient.

## 2020-08-24 ENCOUNTER — Telehealth: Payer: BLUE CROSS/BLUE SHIELD | Admitting: Family Medicine

## 2020-08-26 DIAGNOSIS — L814 Other melanin hyperpigmentation: Secondary | ICD-10-CM | POA: Diagnosis not present

## 2020-08-26 DIAGNOSIS — X32XXXA Exposure to sunlight, initial encounter: Secondary | ICD-10-CM | POA: Diagnosis not present

## 2020-08-26 DIAGNOSIS — D225 Melanocytic nevi of trunk: Secondary | ICD-10-CM | POA: Diagnosis not present

## 2020-08-26 DIAGNOSIS — R208 Other disturbances of skin sensation: Secondary | ICD-10-CM | POA: Diagnosis not present

## 2020-08-26 DIAGNOSIS — D485 Neoplasm of uncertain behavior of skin: Secondary | ICD-10-CM | POA: Diagnosis not present

## 2020-08-26 DIAGNOSIS — L821 Other seborrheic keratosis: Secondary | ICD-10-CM | POA: Diagnosis not present

## 2020-08-26 DIAGNOSIS — D2262 Melanocytic nevi of left upper limb, including shoulder: Secondary | ICD-10-CM | POA: Diagnosis not present

## 2020-08-26 DIAGNOSIS — D2261 Melanocytic nevi of right upper limb, including shoulder: Secondary | ICD-10-CM | POA: Diagnosis not present

## 2020-08-26 DIAGNOSIS — R339 Retention of urine, unspecified: Secondary | ICD-10-CM | POA: Diagnosis not present

## 2020-08-26 DIAGNOSIS — D224 Melanocytic nevi of scalp and neck: Secondary | ICD-10-CM | POA: Diagnosis not present

## 2020-09-10 MED ORDER — BUDESONIDE-FORMOTEROL FUMARATE 160-4.5 MCG/ACT IN AERO: 2.0000 | INHALATION_SPRAY | Freq: Every day | RESPIRATORY_TRACT | 1 refills | Status: DC

## 2020-10-08 DIAGNOSIS — N390 Urinary tract infection, site not specified: Secondary | ICD-10-CM | POA: Diagnosis not present

## 2020-10-10 DIAGNOSIS — R339 Retention of urine, unspecified: Secondary | ICD-10-CM | POA: Diagnosis not present

## 2020-11-04 DIAGNOSIS — G35 Multiple sclerosis: Secondary | ICD-10-CM | POA: Diagnosis not present

## 2020-11-05 ENCOUNTER — Other Ambulatory Visit: Payer: Self-pay | Admitting: Nurse Practitioner

## 2020-11-05 DIAGNOSIS — G35 Multiple sclerosis: Secondary | ICD-10-CM | POA: Diagnosis not present

## 2020-11-10 DIAGNOSIS — Z01818 Encounter for other preprocedural examination: Secondary | ICD-10-CM | POA: Diagnosis not present

## 2020-11-20 ENCOUNTER — Other Ambulatory Visit: Payer: Self-pay | Admitting: Nurse Practitioner

## 2020-11-21 NOTE — Telephone Encounter (Signed)
Requested medications are due for refill today requesting early  Requested medications are on the active medication list yes  Last refill 9/9  Last visit do not see this addressed in OV note, did have phone call where diagnosis was questioned  Future visit scheduled no, canceled 11/25/20  Notes to clinic please assess.

## 2020-11-23 DIAGNOSIS — R339 Retention of urine, unspecified: Secondary | ICD-10-CM | POA: Diagnosis not present

## 2020-11-25 ENCOUNTER — Encounter: Payer: BLUE CROSS/BLUE SHIELD | Admitting: Nurse Practitioner

## 2020-11-26 ENCOUNTER — Telehealth: Payer: Self-pay

## 2020-11-26 NOTE — Telephone Encounter (Signed)
Transition Care Management Follow-up Telephone Call Date of discharge and from where: 11/24/2020  [facility] How have you been since you were released from the hospital? Doing fine Any questions or concerns? No  Items Reviewed: Did the pt receive and understand the discharge instructions provided? Yes  Medications obtained and verified? Yes  Other? No  Any new allergies since your discharge? No  Dietary orders reviewed? Yes Do you have support at home? Yes   Home Care and Equipment/Supplies: Were home health services ordered? no If so, what is the name of the agency?  Has the agency set up a time to come to the patient's home? not applicable Were any new equipment or medical supplies ordered?   What is the name of the medical supply agency?  Were you able to get the supplies/equipment? not applicable Do you have any questions related to the use of the equipment or supplies? No  Functional Questionnaire: (I = Independent and D = Dependent) ADLs: I  Bathing/Dressing- I  Meal Prep- I  Eating- I  Maintaining continence- I  Transferring/Ambulation- I  Managing Meds- I  Follow up appointments reviewed:  PCP Hospital f/u appt confirmed? No   Specialist Hospital f/u appt confirmed? Yes  Scheduled To go t [facility] 12/15/2020 Are transportation arrangements needed? No  If their condition worsens, is the pt aware to call PCP or go to the Emergency Dept.? Yes Was the patient provided with contact information for the PCP's office or ED? Yes Was to pt encouraged to call back with questions or concerns? Yes  Tomasa Rand, RN, BSN, CEN [facility] [employer] [phone]

## 2020-12-11 ENCOUNTER — Other Ambulatory Visit: Payer: Self-pay | Admitting: Nurse Practitioner

## 2020-12-11 NOTE — Telephone Encounter (Signed)
Requested Prescriptions  Pending Prescriptions Disp Refills  . SYMBICORT 160-4.5 MCG/ACT inhaler [Pharmacy Med Name: SYMBICORT 160-4.5 MCG INHALER] 10.2 each 2    Sig: INHALE 2 PUFFS BY MOUTH INTO THE LUNGS DAILY     Pulmonology:  Combination Products Passed - 12/11/2020  1:34 PM      Passed - Valid encounter within last 12 months    Recent Outpatient Visits          3 months ago Morbid obesity ([place])   [facility] Jon Billings, NP   4 months ago Acute bronchitis, unspecified organism   [facility], [place], DO   6 months ago [place], Megan P, DO   7 months ago Morbid obesity ([place])   [place], Megan P, DO   8 months ago Morbid obesity [facility])   [place], [place], DO

## 2020-12-15 DIAGNOSIS — Z9884 Bariatric surgery status: Secondary | ICD-10-CM | POA: Diagnosis not present

## 2020-12-15 DIAGNOSIS — Z713 Dietary counseling and surveillance: Secondary | ICD-10-CM | POA: Diagnosis not present

## 2020-12-15 DIAGNOSIS — Z48815 Encounter for surgical aftercare following surgery on the digestive system: Secondary | ICD-10-CM | POA: Diagnosis not present

## 2020-12-21 NOTE — Progress Notes (Signed)
LMP 07/30/2015 (Approximate)    Subjective:    Patient ID: Christy Cox, female    DOB: 02/14/1976, 45 y.o.   MRN: [id number]  HPI: Christy Cox is a 45 y.o. female  Chief Complaint  Patient presents with   Cough   Patient states she has had a cough after illness that has been going on for about 2 weeks.  Patient states she has been missing activities due to it.  She has tested for COVID and it is negative.  She recently had bariatric surgery.  States her other symptoms are resolving.   Relevant past medical, surgical, family and social history reviewed and updated as indicated. Interim medical history since our last visit reviewed. Allergies and medications reviewed and updated.  Review of Systems  Respiratory:  Positive for cough.    Per HPI unless specifically indicated above     Objective:    LMP 07/30/2015 (Approximate)   Wt Readings from Last 3 Encounters:  05/25/20 259 lb (117.5 kg)  04/27/20 257 lb (116.6 kg)  03/27/20 258 lb 4 oz (117.1 kg)    Physical Exam Vitals and nursing note reviewed.  HENT:     Head: Normocephalic.     Right Ear: Hearing normal.     Left Ear: Hearing normal.     Nose: Nose normal.  Eyes:     Pupils: Pupils are equal, round, and reactive to light.  Pulmonary:     Effort: Pulmonary effort is normal. No respiratory distress.  Neurological:     Mental Status: She is alert.  Psychiatric:        Mood and Affect: Mood normal.        Behavior: Behavior normal.        Thought Content: Thought content normal.        Judgment: Judgment normal.    Results for orders placed or performed in visit on 05/25/20  Urine Culture   Specimen: Urine   UR  Result Value Ref Range   Urine Culture, Routine Final report    Organism ID, Bacteria No growth   Urinalysis, Routine w reflex microscopic  Result Value Ref Range   Specific Gravity, UA >1.030 (H) 1.005 - 1.030   pH, UA 6.5 5.0 - 7.5   Color, UA Yellow Yellow   Appearance Ur Clear  Clear   Leukocytes,UA Negative Negative   Protein,UA Negative Negative/Trace   Glucose, UA Negative Negative   Ketones, UA Negative Negative   RBC, UA Negative Negative   Bilirubin, UA Negative Negative   Urobilinogen, Ur 0.2 0.2 - 1.0 mg/dL   Nitrite, UA Negative Negative      Assessment & Plan:   Problem List Items Addressed This Visit   None Visit Diagnoses     Acute cough    -  Primary   Discussed cough after viral illness. Will avoid steroids due to recent bariatric surgery. Will give albuterol and tessalon to help with symptoms.         Follow up plan: No follow-ups on file.   This visit was completed via MyChart due to the restrictions of the COVID-19 pandemic. All issues as above were discussed and addressed. Physical exam was done as above through visual confirmation on MyChart. If it was felt that the patient should be evaluated in the office, they were directed there. The patient verbally consented to this visit. Location of the patient: Car Location of the provider: Office Those involved with this call:  Provider: Santiago Glad  Mathis Dad, NP CMA: None Front Desk/Registration: Myrlene Broker This encounter was conducted via video.  I spent 15 dedicated to the care of this patient on the date of this encounter to include previsit review of 20, face to face time with the patient, and post visit ordering of testing.

## 2020-12-22 ENCOUNTER — Telehealth (INDEPENDENT_AMBULATORY_CARE_PROVIDER_SITE_OTHER): Payer: BLUE CROSS/BLUE SHIELD | Admitting: Nurse Practitioner

## 2020-12-22 ENCOUNTER — Encounter: Payer: Self-pay | Admitting: Nurse Practitioner

## 2020-12-22 DIAGNOSIS — R051 Acute cough: Secondary | ICD-10-CM

## 2020-12-22 MED ORDER — ALBUTEROL SULFATE HFA 108 (90 BASE) MCG/ACT IN AERS: 2.0000 | INHALATION_SPRAY | Freq: Four times a day (QID) | RESPIRATORY_TRACT | 0 refills | Status: DC | PRN

## 2020-12-22 MED ORDER — BENZONATATE 200 MG PO CAPS: 200.0000 mg | ORAL_CAPSULE | Freq: Two times a day (BID) | ORAL | 0 refills | Status: DC | PRN

## 2020-12-27 DIAGNOSIS — R051 Acute cough: Secondary | ICD-10-CM

## 2020-12-28 ENCOUNTER — Ambulatory Visit
Admission: RE | Admit: 2020-12-28 | Discharge: 2020-12-28 | Disposition: A | Discharge status: Home / Self Care | Payer: BLUE CROSS/BLUE SHIELD | Attending: Nurse Practitioner | Admitting: Nurse Practitioner

## 2020-12-28 ENCOUNTER — Ambulatory Visit
Admission: RE | Admit: 2020-12-28 | Discharge: 2020-12-28 | Disposition: A | Discharge status: Home / Self Care | Payer: BLUE CROSS/BLUE SHIELD | Source: Ambulatory Visit | Attending: Nurse Practitioner | Admitting: Nurse Practitioner

## 2020-12-28 DIAGNOSIS — R051 Acute cough: Secondary | ICD-10-CM | POA: Insufficient documentation

## 2020-12-30 NOTE — Progress Notes (Signed)
Hi Christy Cox.  Your chest xray was normal.  As discussed during your visit, it can take 4-6 weeks for a cough to resolve.

## 2021-02-04 DIAGNOSIS — R399 Unspecified symptoms and signs involving the genitourinary system: Secondary | ICD-10-CM | POA: Diagnosis not present

## 2021-02-04 DIAGNOSIS — R350 Frequency of micturition: Secondary | ICD-10-CM | POA: Diagnosis not present

## 2021-02-04 DIAGNOSIS — N3941 Urge incontinence: Secondary | ICD-10-CM | POA: Diagnosis not present

## 2021-02-04 DIAGNOSIS — N3281 Overactive bladder: Secondary | ICD-10-CM | POA: Diagnosis not present

## 2021-02-11 DIAGNOSIS — R339 Retention of urine, unspecified: Secondary | ICD-10-CM | POA: Diagnosis not present

## 2021-02-22 ENCOUNTER — Other Ambulatory Visit: Payer: Self-pay | Admitting: Nurse Practitioner

## 2021-02-23 NOTE — Telephone Encounter (Signed)
Requested medication (s) are due for refill today: yes  Requested medication (s) are on the active medication list: yes  Last refill:  08/19/20 #180 with 1 refill  Future visit scheduled: no  Notes to clinic:  Please review for refill. Refill not delegated per protocol.    Requested Prescriptions  Pending Prescriptions Disp Refills   topiramate (TOPAMAX) 100 MG tablet [Pharmacy Med Name: TOPIRAMATE 100 MG TABLET] 60 tablet 5    Sig: TAKE 1 TABLET BY MOUTH TWICE A DAY     Not Delegated - Neurology: Anticonvulsants - topiramate & zonisamide Failed - 02/22/2021  2:23 PM      Failed - This refill cannot be delegated      Failed - Cr in normal range and within 360 days    Creatinine, Ser  Date Value Ref Range Status  12/25/2019 0.93 0.57 - 1.00 mg/dL Final          Failed - CO2 in normal range and within 360 days    CO2  Date Value Ref Range Status  12/25/2019 21 20 - 29 mmol/L Final          Passed - Valid encounter within last 12 months    Recent Outpatient Visits           2 months ago Acute cough   [place], NP   6 months ago Morbid obesity ([place])   [facility] Jon Bill[place], NP   6 months ago Acute bronchitis, unspecified organism   [facility], [place], DO   9 months ago [place], Megan P, DO   10 months ago Morbid obesity [facility])   [place], [place], DO

## 2021-02-24 NOTE — Telephone Encounter (Signed)
Patient will need a visit for further refills

## 2021-02-26 DIAGNOSIS — R399 Unspecified symptoms and signs involving the genitourinary system: Secondary | ICD-10-CM | POA: Diagnosis not present

## 2021-03-03 DIAGNOSIS — K5900 Constipation, unspecified: Secondary | ICD-10-CM | POA: Diagnosis not present

## 2021-03-03 DIAGNOSIS — Z713 Dietary counseling and surveillance: Secondary | ICD-10-CM | POA: Diagnosis not present

## 2021-03-03 DIAGNOSIS — K912 Postsurgical malabsorption, not elsewhere classified: Secondary | ICD-10-CM | POA: Diagnosis not present

## 2021-03-03 DIAGNOSIS — Z48815 Encounter for surgical aftercare following surgery on the digestive system: Secondary | ICD-10-CM | POA: Diagnosis not present

## 2021-03-03 DIAGNOSIS — Z9884 Bariatric surgery status: Secondary | ICD-10-CM | POA: Diagnosis not present

## 2021-03-04 DIAGNOSIS — N3941 Urge incontinence: Secondary | ICD-10-CM | POA: Diagnosis not present

## 2021-03-04 DIAGNOSIS — N3281 Overactive bladder: Secondary | ICD-10-CM | POA: Diagnosis not present

## 2021-03-20 ENCOUNTER — Other Ambulatory Visit: Payer: Self-pay | Admitting: Nurse Practitioner

## 2021-03-20 NOTE — Telephone Encounter (Signed)
Requested medication (s) are due for refill today: yes  Requested medication (s) are on the active medication list: yes  Last refill:  #60  Future visit scheduled: yes  Notes to clinic:  med not delegated to NT to RF   Requested Prescriptions  Pending Prescriptions Disp Refills   topiramate (TOPAMAX) 100 MG tablet [Pharmacy Med Name: TOPIRAMATE 100 MG TABLET] 60 tablet 0    Sig: TAKE 1 TABLET BY MOUTH TWICE A DAY     Not Delegated - Neurology: Anticonvulsants - topiramate & zonisamide Failed - 03/20/2021 12:33 PM      Failed - This refill cannot be delegated      Failed - Cr in normal range and within 360 days    Creatinine, Ser  Date Value Ref Range Status  12/25/2019 0.93 0.57 - 1.00 mg/dL Final          Failed - CO2 in normal range and within 360 days    CO2  Date Value Ref Range Status  12/25/2019 21 20 - 29 mmol/L Final          Passed - Valid encounter within last 12 months    Recent Outpatient Visits           2 months ago Acute cough   [place], NP   7 months ago Morbid obesity ([place])   [facility] Jon Billings, NP   7 months ago Acute bronchitis, unspecified organism   [facility], [place], DO   9 months ago [place], Megan P, DO   10 months ago Morbid obesity [facility])   [place], [place], DO

## 2021-03-23 DIAGNOSIS — N3941 Urge incontinence: Secondary | ICD-10-CM | POA: Diagnosis not present

## 2021-04-09 DIAGNOSIS — R339 Retention of urine, unspecified: Secondary | ICD-10-CM | POA: Diagnosis not present

## 2021-04-26 ENCOUNTER — Other Ambulatory Visit: Payer: Self-pay | Admitting: Nurse Practitioner

## 2021-04-27 NOTE — Telephone Encounter (Signed)
Requested Prescriptions  Pending Prescriptions Disp Refills   budesonide-formoterol (SYMBICORT) 160-4.5 MCG/ACT inhaler [Pharmacy Med Name: BUDESONIDE-FORMOTEROL 160-4.5] 10.2 each 2    Sig: INHALE 2 PUFFS BY MOUTH INTO THE LUNGS DAILY     Pulmonology:  Combination Products Passed - 04/26/2021  9:32 AM      Passed - Valid encounter within last 12 months    Recent Outpatient Visits          4 months ago Acute cough   [place], NP   8 months ago Morbid obesity [facility])   [facility] Jon Billings, NP   8 months ago Acute bronchitis, unspecified organism   [facility], [place], DO   11 months ago [place], DO   1 year ago Morbid obesity [facility])   [place], [place], DO

## 2021-05-05 DIAGNOSIS — H524 Presbyopia: Secondary | ICD-10-CM | POA: Diagnosis not present

## 2021-05-14 DIAGNOSIS — R339 Retention of urine, unspecified: Secondary | ICD-10-CM | POA: Diagnosis not present

## 2021-06-18 ENCOUNTER — Other Ambulatory Visit: Payer: Self-pay | Admitting: Nurse Practitioner

## 2021-06-18 DIAGNOSIS — Z1231 Encounter for screening mammogram for malignant neoplasm of breast: Secondary | ICD-10-CM

## 2021-06-23 DIAGNOSIS — R339 Retention of urine, unspecified: Secondary | ICD-10-CM | POA: Diagnosis not present

## 2021-06-26 DIAGNOSIS — R339 Retention of urine, unspecified: Secondary | ICD-10-CM | POA: Diagnosis not present

## 2021-06-28 DIAGNOSIS — Z9884 Bariatric surgery status: Secondary | ICD-10-CM | POA: Diagnosis not present

## 2021-06-28 DIAGNOSIS — K912 Postsurgical malabsorption, not elsewhere classified: Secondary | ICD-10-CM | POA: Diagnosis not present

## 2021-06-28 DIAGNOSIS — Z6830 Body mass index (BMI) 30.0-30.9, adult: Secondary | ICD-10-CM | POA: Diagnosis not present

## 2021-06-28 DIAGNOSIS — Z48815 Encounter for surgical aftercare following surgery on the digestive system: Secondary | ICD-10-CM | POA: Diagnosis not present

## 2021-06-28 DIAGNOSIS — Z713 Dietary counseling and surveillance: Secondary | ICD-10-CM | POA: Diagnosis not present

## 2021-06-28 DIAGNOSIS — Z79899 Other long term (current) drug therapy: Secondary | ICD-10-CM | POA: Diagnosis not present

## 2021-06-28 DIAGNOSIS — Z8349 Family history of other endocrine, nutritional and metabolic diseases: Secondary | ICD-10-CM | POA: Diagnosis not present

## 2021-07-01 DIAGNOSIS — N3281 Overactive bladder: Secondary | ICD-10-CM | POA: Diagnosis not present

## 2021-07-01 DIAGNOSIS — N3941 Urge incontinence: Secondary | ICD-10-CM | POA: Diagnosis not present

## 2021-07-22 ENCOUNTER — Ambulatory Visit
Admission: RE | Admit: 2021-07-22 | Discharge: 2021-07-22 | Disposition: A | Discharge status: Home / Self Care | Payer: BLUE CROSS/BLUE SHIELD | Source: Ambulatory Visit | Attending: Nurse Practitioner | Admitting: Nurse Practitioner

## 2021-07-22 DIAGNOSIS — Z1231 Encounter for screening mammogram for malignant neoplasm of breast: Secondary | ICD-10-CM | POA: Diagnosis not present

## 2021-07-23 DIAGNOSIS — R339 Retention of urine, unspecified: Secondary | ICD-10-CM | POA: Diagnosis not present

## 2021-07-23 NOTE — Progress Notes (Signed)
Please let patient know her Mammogram did not show any evidence of a malignancy.  The recommendation is to repeat the Mammogram in 1 year.  

## 2021-07-27 ENCOUNTER — Other Ambulatory Visit: Payer: Self-pay | Admitting: Nurse Practitioner

## 2021-07-28 NOTE — Telephone Encounter (Signed)
Requested Prescriptions  Pending Prescriptions Disp Refills  . budesonide-formoterol (SYMBICORT) 160-4.5 MCG/ACT inhaler [Pharmacy Med Name: BUDESONIDE-FORMOTEROL 160-4.5] 10.2 each 2    Sig: INHALE 2 PUFFS BY MOUTH INTO THE LUNGS DAILY     Pulmonology:  Combination Products Passed - 07/27/2021  7:42 AM      Passed - Valid encounter within last 12 months    Recent Outpatient Visits          7 months ago Acute cough   [place], NP   11 months ago Morbid obesity [facility])   [facility] Jon Bill[place], NP   11 months ago Acute bronchitis, unspecified organism   [facility], [place], DO   1 year ago [place], Dora, DO   1 year ago Morbid obesity [facility])   [place], [place], DO

## 2021-08-20 DIAGNOSIS — R339 Retention of urine, unspecified: Secondary | ICD-10-CM | POA: Diagnosis not present

## 2021-08-23 DIAGNOSIS — R339 Retention of urine, unspecified: Secondary | ICD-10-CM | POA: Diagnosis not present

## 2021-08-26 ENCOUNTER — Encounter: Payer: Self-pay | Admitting: Family Medicine

## 2021-08-26 ENCOUNTER — Telehealth (INDEPENDENT_AMBULATORY_CARE_PROVIDER_SITE_OTHER): Payer: BLUE CROSS/BLUE SHIELD | Admitting: Family Medicine

## 2021-08-26 VITALS — Wt 180.0 lb

## 2021-08-26 DIAGNOSIS — M79605 Pain in left leg: Secondary | ICD-10-CM

## 2021-08-26 DIAGNOSIS — M79604 Pain in right leg: Secondary | ICD-10-CM

## 2021-08-26 NOTE — Progress Notes (Signed)
Wt 180 lb (81.6 kg)   LMP 07/30/2015 (Approximate)   BMI 28.45 kg/m    Subjective:    Patient ID: Christy Cox, female    DOB: [birth year], 46 y.o.   MRN: [id number]  HPI: Christy Cox is a 46 y.o. female  Chief Complaint  Patient presents with   Pain    Pt states she has been having pain in her leg bones while she is trying to sleep. States this has been going on for a few months and she only feels the pain while laying down.    Has been having pain in her legs when she lays down at night. It's keeping her awake during the day, but not as bad. It's keeping her from sleeping. She notes that when she gets up it goes away. It's hurting in her shins and in her knees. It's a dull deep ache. Does not radiating. Both legs equally. No numbness or tingling. She is otherwise feeling well with no other concerns or complaints at this time.  Relevant past medical, surgical, family and social history reviewed and updated as indicated. Interim medical history since our last visit reviewed. Allergies and medications reviewed and updated.  Review of Systems  Constitutional: Negative.   Respiratory: Negative.    Cardiovascular: Negative.   Gastrointestinal: Negative.   Musculoskeletal:  Positive for arthralgias and myalgias. Negative for back pain, gait problem, joint swelling, neck pain and neck stiffness.  Skin: Negative.   Neurological: Negative.   Psychiatric/Behavioral: Negative.      Per HPI unless specifically indicated above     Objective:    Wt 180 lb (81.6 kg)   LMP 07/30/2015 (Approximate)   BMI 28.45 kg/m   Wt Readings from Last 3 Encounters:  08/26/21 180 lb (81.6 kg)  05/25/20 259 lb (117.5 kg)  04/27/20 257 lb (116.6 kg)    Physical Exam Vitals and nursing note reviewed.  Constitutional:      General: She is not in acute distress.    Appearance: Normal appearance. She is not ill-appearing, toxic-appearing or diaphoretic.  HENT:     Head: Normocephalic and  atraumatic.     Right Ear: External ear normal.     Left Ear: External ear normal.     Nose: Nose normal.     Mouth/Throat:     Mouth: Mucous membranes are moist.     Pharynx: Oropharynx is clear.  Eyes:     General: No scleral icterus.       Right eye: No discharge.        Left eye: No discharge.     Conjunctiva/sclera: Conjunctivae normal.     Pupils: Pupils are equal, round, and reactive to light.  Pulmonary:     Effort: Pulmonary effort is normal. No respiratory distress.     Comments: Speaking in full sentences Musculoskeletal:        General: Normal range of motion.     Cervical back: Normal range of motion.  Skin:    Coloration: Skin is not jaundiced or pale.     Findings: No bruising, erythema, lesion or rash.  Neurological:     Mental Status: She is alert and oriented to person, place, and time. Mental status is at baseline.  Psychiatric:        Mood and Affect: Mood normal.        Behavior: Behavior normal.        Thought Content: Thought content normal.  Judgment: Judgment normal.     Results for orders placed or performed in visit on 05/25/20  Urine Culture   Specimen: Urine   UR  Result Value Ref Range   Urine Culture, Routine Final report    Organism ID, Bacteria No growth   Urinalysis, Routine w reflex microscopic  Result Value Ref Range   Specific Gravity, UA >1.030 (H) 1.005 - 1.030   pH, UA 6.5 5.0 - 7.5   Color, UA Yellow Yellow   Appearance Ur Clear Clear   Leukocytes,UA Negative Negative   Protein,UA Negative Negative/Trace   Glucose, UA Negative Negative   Ketones, UA Negative Negative   RBC, UA Negative Negative   Bilirubin, UA Negative Negative   Urobilinogen, Ur 0.2 0.2 - 1.0 mg/dL   Nitrite, UA Negative Negative      Assessment & Plan:   Problem List Items Addressed This Visit   None Visit Diagnoses     Bilateral leg pain    -  Primary   Will check x-rays. Due for physical. Will get her back in in about a month for labs and  physical. Will get x-rays ASAP.   Relevant Orders   DG Lumbar Spine Complete   DG Tibia/Fibula Left   DG Tibia/Fibula Right   DG Knee Complete 4 Views Right   DG Knee Complete 4 Views Left        Follow up plan: Return in about 4 weeks (around 09/23/2021) for physical.   This visit was completed via video visit through [place] due to the restrictions of the COVID-19 pandemic. All issues as above were discussed and addressed. Physical exam was done as above through visual confirmation on video through MyChart. If it was felt that the patient should be evaluated in the office, they were directed there. The patient verbally consented to this visit. Location of the patient: home Location of the provider: work Those involved with this call:  Provider: Park Liter, DO CMA: Yvonna Alanis, [place] Desk/Registration: [employer]  Time spent on call:  15 minutes with patient face to face via video conference. More than 50% of this time was spent in counseling and coordination of care. 23 minutes total spent in review of patient's record and preparation of their chart.

## 2021-08-27 NOTE — Progress Notes (Signed)
Pt scheduled  

## 2021-08-30 ENCOUNTER — Ambulatory Visit
Admission: RE | Admit: 2021-08-30 | Discharge: 2021-08-30 | Disposition: A | Discharge status: Home / Self Care | Payer: BLUE CROSS/BLUE SHIELD | Source: Ambulatory Visit | Attending: Family Medicine | Admitting: Family Medicine

## 2021-08-30 ENCOUNTER — Ambulatory Visit
Admission: RE | Admit: 2021-08-30 | Discharge: 2021-08-30 | Disposition: A | Discharge status: Home / Self Care | Payer: BLUE CROSS/BLUE SHIELD | Attending: Family Medicine | Admitting: Family Medicine

## 2021-08-30 DIAGNOSIS — M25562 Pain in left knee: Secondary | ICD-10-CM | POA: Diagnosis not present

## 2021-08-30 DIAGNOSIS — M545 Low back pain, unspecified: Secondary | ICD-10-CM | POA: Diagnosis not present

## 2021-08-30 DIAGNOSIS — M79604 Pain in right leg: Secondary | ICD-10-CM

## 2021-08-30 DIAGNOSIS — M25561 Pain in right knee: Secondary | ICD-10-CM | POA: Diagnosis not present

## 2021-08-30 DIAGNOSIS — M79661 Pain in right lower leg: Secondary | ICD-10-CM | POA: Diagnosis not present

## 2021-08-30 DIAGNOSIS — M79662 Pain in left lower leg: Secondary | ICD-10-CM | POA: Diagnosis not present

## 2021-08-30 DIAGNOSIS — M79605 Pain in left leg: Secondary | ICD-10-CM | POA: Insufficient documentation

## 2021-08-30 DIAGNOSIS — M1711 Unilateral primary osteoarthritis, right knee: Secondary | ICD-10-CM | POA: Diagnosis not present

## 2021-09-16 DIAGNOSIS — R339 Retention of urine, unspecified: Secondary | ICD-10-CM | POA: Diagnosis not present

## 2021-09-28 ENCOUNTER — Telehealth: Payer: Self-pay | Admitting: Nurse Practitioner

## 2021-09-28 NOTE — Progress Notes (Unsigned)
LMP 07/30/2015 (Approximate)    Subjective:    Patient ID: Christy Cox, female    DOB: 12/13/1975, 46 y.o.   MRN: [id number]  HPI: Christy Cox is a 46 y.o. female presenting on 09/29/2021 for comprehensive medical examination. Current medical complaints include:{Blank single:19197::"none","***"}  She currently lives with: Menopausal Symptoms: {Blank single:19197::"yes","no"}  Depression Screen done today and results listed below:     08/26/2021    1:18 PM 01/27/2020   11:04 AM 11/21/2018    9:19 AM 05/05/2017   10:37 AM 12/06/2016    8:51 AM  Depression screen PHQ 2/9  Decreased Interest 0 0 0 0   Down, Depressed, Hopeless 0 0 0 0 0  PHQ - 2 Score 0 0 0 0 0  Altered sleeping 0 0 0  0  Tired, decreased energy '3 3 1  '$ 0  Change in appetite 0 0 0  0  Feeling bad or failure about yourself  0 0 0  0  Trouble concentrating 0 0 0  0  Moving slowly or fidgety/restless 0 0 0  0  Suicidal thoughts 0 0 0  0  PHQ-9 Score '3 3 1  '$ 0  Difficult doing work/chores Not difficult at all Not difficult at all   Not difficult at all    The patient {has/does not have:19849} a history of falls. I {did/did not:19850} complete a risk assessment for falls. A plan of care for falls {was/was not:19852} documented.   Past Medical History:  Past Medical History:  Diagnosis Date  . 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 Multiple sclerosis [facility]) previous neurologist-- [facility].--  since stem cell transplant in 08/05/2015 done in [place], [place]    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  . OAB (overactive bladder)   . PONV (postoperative nausea and vomiting)    severe    Surgical History:  Past Surgical History:  Procedure Laterality Date  . CESAREAN SECTION  1999; 2003   BILATERAL TUBAL LIGATION W/ LAST C/S  . CYSTO/  BOTOX INJECTION  multiple-- last one 2018 @ [facility]  . CYSTOSCOPY WITH INJECTION  N/A 09/26/2017   Procedure: CYSTOSCOPY WITH INJECTION/ BOTOX;  Surgeon: Ceasar Mons, MD;  Location: [facility];  Service: Urology;  Laterality: N/A;  . HERNIA REPAIR  age 69 and age 65    Medications:  Current Outpatient Medications on File Prior to Visit  Medication Sig  . albuterol (VENTOLIN HFA) 108 (90 Base) MCG/ACT inhaler Inhale 2 puffs into the lungs every 6 (six) hours as needed for wheezing or shortness of breath.  . budesonide-formoterol (SYMBICORT) 160-4.5 MCG/ACT inhaler INHALE 2 PUFFS BY MOUTH INTO THE LUNGS DAILY  . mirabegron ER (MYRBETRIQ) 25 MG TB24 tablet Take by mouth.  . sulfamethoxazole-trimethoprim (BACTRIM DS) 800-160 MG tablet Take 1 tablet by mouth daily.   Current Facility-Administered Medications on File Prior to Visit  Medication  . botulinum toxin Type A (BOTOX) injection 100 Units    Allergies:  Allergies  Allergen Reactions  . Macrodantin [Nitrofurantoin Macrocrystal] Other (See Comments)    Caused elevated liver function and jaundice    Social History:  Social History   Socioeconomic History  . Marital status: Divorced    Spouse name: dennis  . Number of children: 2  . Years of education: College  . Highest education level: Not on file  Occupational History  .  Occupation: n/a    Comment: disabled  Tobacco Use  . Smoking status: Never  . Smokeless tobacco: Never  Vaping Use  . Vaping Use: Never used  Substance and Sexual Activity  . Alcohol use: Not Currently  . Drug use: No  . Sexual activity: Yes    Birth control/protection: Surgical  Other Topics Concern  . Not on file  Social History Narrative   Pt lives at home with her spouse.    Caffeine Use- 1-2 per week   Social Determinants of Health   Financial Resource Strain: Low Risk  (05/05/2017)   Overall Financial Resource Strain (CARDIA)   . Difficulty of Paying Living Expenses: Not hard at all  Food Insecurity: No Food Insecurity (05/05/2017)   Hunger  Vital Sign   . Worried About Charity fundraiser in the Last Year: Never true   . Ran Out of Food in the Last Year: Never true  Transportation Needs: No Transportation Needs (05/05/2017)   PRAPARE - Transportation   . Lack of Transportation (Medical): No   . Lack of Transportation (Non-Medical): No  Physical Activity: Inactive (05/05/2017)   Exercise Vital Sign   . Days of Exercise per Week: 0 days   . Minutes of Exercise per Session: 0 min  Stress: No Stress Concern Present (05/05/2017)   [place]   . Feeling of Stress : Not at all  Social Connections: Somewhat Isolated (05/05/2017)   Social Connection and Isolation Panel [NHANES]   . Frequency of Communication with Friends and Family: More than three times a week   . Frequency of Social Gatherings with Friends and Family: More than three times a week   . Attends Religious Services: More than 4 times per year   . Active Member of Clubs or Organizations: No   . Attends Archivist Meetings: Never   . Marital Status: Separated  Intimate Partner Violence: Not At Risk (05/05/2017)   Humiliation, Afraid, Rape, and Kick questionnaire   . Fear of Current or Ex-Partner: No   . Emotionally Abused: No   . Physically Abused: No   . Sexually Abused: No   Social History   Tobacco Use  Smoking Status Never  Smokeless Tobacco Never   Social History   Substance and Sexual Activity  Alcohol Use Not Currently    Family History:  Family History  Problem Relation Age of Onset  . Breast cancer Mother 4  . Heart attack Father 52       x2 at 93 them at 71, now s/p CABG  . Kidney disease Father   . Cancer Other   . Coronary artery disease Other   . Diabetes Other   . Cancer Paternal Grandmother     Past medical history, surgical history, medications, allergies, family history and social history reviewed with patient today and changes made to appropriate areas of the  chart.   ROS All other ROS negative except what is listed above and in the HPI.      Objective:    LMP 07/30/2015 (Approximate)   Wt Readings from Last 3 Encounters:  08/26/21 180 lb (81.6 kg)  05/25/20 259 lb (117.5 kg)  04/27/20 257 lb (116.6 kg)    Physical Exam  Results for orders placed or performed in visit on 09/13/21  HM PAP SMEAR  Result Value Ref Range   HM Pap smear Negative- see report scanned into chart  Assessment & Plan:   Problem List Items Addressed This Visit   None Visit Diagnoses     Annual physical exam    -  Primary   Screening for ischemic heart disease            Follow up plan: No follow-ups on file.   LABORATORY TESTING:  - Pap smear: {Blank VEHMCN:47096::"GEZ done","not applicable","up to date","done elsewhere"}  IMMUNIZATIONS:   - Tdap: Tetanus vaccination status reviewed: {tetanus status:315746}. - Influenza: {Blank single:19197::"Up to date","Administered today","Postponed to flu season","Refused","Given elsewhere"} - Pneumovax: {Blank single:19197::"Up to date","Administered today","Not applicable","Refused","Given elsewhere"} - Prevnar: {Blank single:19197::"Up to date","Administered today","Not applicable","Refused","Given elsewhere"} - COVID: {Blank single:19197::"Up to date","Administered today","Not applicable","Refused","Given elsewhere"} - HPV: {Blank single:19197::"Up to date","Administered today","Not applicable","Refused","Given elsewhere"} - Shingrix vaccine: {Blank single:19197::"Up to date","Administered today","Not applicable","Refused","Given elsewhere"}  SCREENING: -Mammogram: {Blank single:19197::"Up to date","Ordered today","Not applicable","Refused","Done elsewhere"}  - Colonoscopy: {Blank single:19197::"Up to date","Ordered today","Not applicable","Refused","Done elsewhere"}  - Bone Density: {Blank single:19197::"Up to date","Ordered today","Not applicable","Refused","Done elsewhere"}  -Hearing Test: {Blank  single:19197::"Up to date","Ordered today","Not applicable","Refused","Done elsewhere"}  -Spirometry: {Blank single:19197::"Up to date","Ordered today","Not applicable","Refused","Done elsewhere"}   PATIENT COUNSELING:   Advised to take 1 mg of folate supplement per day if capable of pregnancy.   Sexuality: Discussed sexually transmitted diseases, partner selection, use of condoms, avoidance of unintended pregnancy  and contraceptive alternatives.   Advised to avoid cigarette smoking.  I discussed with the patient that most people either abstain from alcohol or drink within safe limits (<=14/week and <=4 drinks/occasion for males, <=7/weeks and <= 3 drinks/occasion for females) and that the risk for alcohol disorders and other health effects rises proportionally with the number of drinks per week and how often a drinker exceeds daily limits.  Discussed cessation/primary prevention of drug use and availability of treatment for abuse.   Diet: Encouraged to adjust caloric intake to maintain  or achieve ideal body weight, to reduce intake of dietary saturated fat and total fat, to limit sodium intake by avoiding high sodium foods and not adding table salt, and to maintain adequate dietary potassium and calcium preferably from fresh fruits, vegetables, and low-fat dairy products.    stressed the importance of regular exercise  Injury prevention: Discussed safety belts, safety helmets, smoke detector, smoking near bedding or upholstery.   Dental health: Discussed importance of regular tooth brushing, flossing, and dental visits.    NEXT PREVENTATIVE PHYSICAL DUE IN 1 YEAR. No follow-ups on file.

## 2021-09-29 ENCOUNTER — Encounter: Payer: Self-pay | Admitting: Nurse Practitioner

## 2021-09-29 ENCOUNTER — Ambulatory Visit (INDEPENDENT_AMBULATORY_CARE_PROVIDER_SITE_OTHER): Payer: BLUE CROSS/BLUE SHIELD | Admitting: Nurse Practitioner

## 2021-09-29 VITALS — BP 90/63 | HR 74 | Temp 97.5°F | Ht 66.73 in | Wt 185.0 lb

## 2021-09-29 DIAGNOSIS — Z9484 Stem cells transplant status: Secondary | ICD-10-CM

## 2021-09-29 DIAGNOSIS — Z136 Encounter for screening for cardiovascular disorders: Secondary | ICD-10-CM

## 2021-09-29 DIAGNOSIS — M79605 Pain in left leg: Secondary | ICD-10-CM | POA: Diagnosis not present

## 2021-09-29 DIAGNOSIS — M79604 Pain in right leg: Secondary | ICD-10-CM

## 2021-09-29 DIAGNOSIS — Z0000 Encounter for general adult medical examination without abnormal findings: Secondary | ICD-10-CM | POA: Diagnosis not present

## 2021-09-29 DIAGNOSIS — Z1211 Encounter for screening for malignant neoplasm of colon: Secondary | ICD-10-CM

## 2021-09-29 LAB — URINALYSIS, ROUTINE W REFLEX MICROSCOPIC
Bilirubin, UA: NEGATIVE
Glucose, UA: NEGATIVE
Ketones, UA: NEGATIVE
Leukocytes,UA: NEGATIVE
Nitrite, UA: NEGATIVE
Protein,UA: NEGATIVE
RBC, UA: NEGATIVE
Specific Gravity, UA: 1.025 (ref 1.005–1.030)
Urobilinogen, Ur: 0.2 mg/dL (ref 0.2–1.0)
pH, UA: 6 (ref 5.0–7.5)

## 2021-09-29 NOTE — Assessment & Plan Note (Signed)
Chronic. Transplant was in 2017.  Still in remission.  Follows up with Specialist in [place].

## 2021-09-29 NOTE — Progress Notes (Signed)
Hi Christy Cox.  Your urine was clear.  No evidence of bacteria.

## 2021-09-30 ENCOUNTER — Encounter: Payer: Self-pay | Admitting: Nurse Practitioner

## 2021-09-30 DIAGNOSIS — D696 Thrombocytopenia, unspecified: Secondary | ICD-10-CM

## 2021-09-30 LAB — LIPID PANEL
Chol/HDL Ratio: 2.7 ratio (ref 0.0–4.4)
Cholesterol, Total: 147 mg/dL (ref 100–199)
HDL: 54 mg/dL (ref >39)
LDL Chol Calc (NIH): 78 mg/dL (ref 0–99)
Triglycerides: 74 mg/dL (ref 0–149)
VLDL Cholesterol Cal: 15 mg/dL (ref 5–40)

## 2021-09-30 LAB — COMPREHENSIVE METABOLIC PANEL
ALT: 18 IU/L (ref 0–32)
AST: 23 IU/L (ref 0–40)
Albumin/Globulin Ratio: 1.8 (ref 1.2–2.2)
Albumin: 4.2 g/dL (ref 3.9–4.9)
Alkaline Phosphatase: 70 IU/L (ref 44–121)
BUN/Creatinine Ratio: 22 (ref 9–23)
BUN: 17 mg/dL (ref 6–24)
Bilirubin Total: 0.5 mg/dL (ref 0.0–1.2)
CO2: 23 mmol/L (ref 20–29)
Calcium: 9.3 mg/dL (ref 8.7–10.2)
Chloride: 104 mmol/L (ref 96–106)
Creatinine, Ser: 0.76 mg/dL (ref 0.57–1.00)
Globulin, Total: 2.3 g/dL (ref 1.5–4.5)
Glucose: 81 mg/dL (ref 70–99)
Potassium: 4 mmol/L (ref 3.5–5.2)
Sodium: 142 mmol/L (ref 134–144)
Total Protein: 6.5 g/dL (ref 6.0–8.5)
eGFR: 98 mL/min/{1.73_m2} (ref >59)

## 2021-09-30 LAB — CBC WITH DIFFERENTIAL/PLATELET
Basophils Absolute: 0 10*3/uL (ref 0.0–0.2)
Basos: 0 %
EOS (ABSOLUTE): 0.2 10*3/uL (ref 0.0–0.4)
Eos: 7 %
Hematocrit: 37.1 % (ref 34.0–46.6)
Hemoglobin: 12.1 g/dL (ref 11.1–15.9)
Immature Grans (Abs): 0 10*3/uL (ref 0.0–0.1)
Immature Granulocytes: 0 %
Lymphocytes Absolute: 1.2 10*3/uL (ref 0.7–3.1)
Lymphs: 36 %
MCH: 31.3 pg (ref 26.6–33.0)
MCHC: 32.6 g/dL (ref 31.5–35.7)
MCV: 96 fL (ref 79–97)
Monocytes Absolute: 0.3 10*3/uL (ref 0.1–0.9)
Monocytes: 8 %
Neutrophils Absolute: 1.6 10*3/uL (ref 1.4–7.0)
Neutrophils: 49 %
Platelets: 95 10*3/uL — CL (ref 150–450)
RBC: 3.87 x10E6/uL (ref 3.77–5.28)
RDW: 13.2 % (ref 11.7–15.4)
WBC: 3.3 10*3/uL — ABNORMAL LOW (ref 3.4–10.8)

## 2021-09-30 LAB — TSH: TSH: 1.9 u[IU]/mL (ref 0.450–4.500)

## 2021-09-30 NOTE — Progress Notes (Signed)
Please let patient know that overall her lab work looks good.  Her platelets are lower than they have been.  I think she should see Hematology for further evaluation.   Otherwise, her lab work looks good.  No concerns at this time.  Continue with current medication regimen.  Follow up as discussed.

## 2021-09-30 NOTE — Telephone Encounter (Signed)
error 

## 2021-10-01 DIAGNOSIS — M2550 Pain in unspecified joint: Secondary | ICD-10-CM | POA: Diagnosis not present

## 2021-10-09 DIAGNOSIS — D696 Thrombocytopenia, unspecified: Secondary | ICD-10-CM | POA: Insufficient documentation

## 2021-10-09 DIAGNOSIS — D693 Immune thrombocytopenic purpura: Secondary | ICD-10-CM | POA: Insufficient documentation

## 2021-10-09 NOTE — Progress Notes (Unsigned)
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  She also has a mild leukopenia.  Currently, she feels well and is asymptomatic.  She has no neurologic complaints.  She denies any recent fevers or illnesses.  She has good appetite and denies weight loss.  She has no chest pain, shortness of breath, cough, or hemoptysis.  She denies any nausea, vomiting, constipation, or diarrhea.  She has no urinary complaints.  Patient offers no further specific complaints today.  REVIEW OF SYSTEMS:   Review of Systems  Constitutional: Negative.  Negative for fever, malaise/fatigue and weight loss.  Respiratory: Negative.  Negative for cough, hemoptysis and shortness of breath.   Cardiovascular: Negative.  Negative for chest pain and leg swelling.  Gastrointestinal: Negative.  Negative for abdominal pain.  Genitourinary: Negative.  Negative for dysuria.  Musculoskeletal: Negative.  Negative for back pain.  Skin: Negative.  Negative for rash.  Neurological: Negative.  Negative for dizziness, focal weakness, weakness and headaches.  Endo/Heme/Allergies:  Does not bruise/bleed easily.  Psychiatric/Behavioral: Negative.  The patient is not nervous/anxious.     As per HPI. Otherwise, a complete review of systems is negative.  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   Multiple sclerosis [facility]) previous neurologist-- [facility]  since stem cell transplant in 08/05/2015 done in [place], [place]    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   OAB (overactive bladder)    PONV (postoperative nausea and vomiting)    severe    PAST SURGICAL HISTORY: Past Surgical History:  Procedure Laterality Date   CESAREAN SECTION  1999; 2003   BILATERAL TUBAL LIGATION W/ LAST C/S   CYSTO/  BOTOX INJECTION  multiple-- last one 2018 @ [facility] N/A 09/26/2017   Procedure: CYSTOSCOPY WITH INJECTION/ BOTOX;  Surgeon: Ceasar Mons, MD;  Location: [facility];  Service: Urology;  Laterality: N/A;   HERNIA REPAIR  age 6 and age 85    FAMILY HISTORY: Family History  Problem Relation Age of Onset   Breast cancer Mother 86   Heart attack Father 27       x2 at 70 them at 25, now s/p CABG   Kidney disease Father    Cancer Other    Coronary artery disease Other    Diabetes Other    Cancer Paternal Grandmother     ADVANCED DIRECTIVES (Y/N):  N  HEALTH MAINTENANCE: Social History   Tobacco Use   Smoking status: Never   Smokeless tobacco: Never  Vaping Use   Vaping Use: Never used  Substance Use Topics   Alcohol use: Not Currently   Drug use: No     Colonoscopy:  PAP:  Bone density:  Lipid panel:  Allergies  Allergen Reactions   Macrodantin [Nitrofurantoin Macrocrystal] Other (See Comments)    Caused elevated liver function and jaundice  Current Outpatient Medications  Medication Sig Dispense Refill   Acetaminophen (TYLENOL 8 HOUR PO) Take 250 mg by mouth at bedtime.     budesonide-formoterol (SYMBICORT) 160-4.5 MCG/ACT inhaler INHALE 2 PUFFS BY MOUTH INTO THE LUNGS DAILY 10.2 each 2   mirabegron ER (MYRBETRIQ) 25 MG TB24 tablet Take by mouth.     sulfamethoxazole-trimethoprim (BACTRIM DS) 800-160 MG tablet Take 1 tablet by mouth daily.     albuterol (VENTOLIN  HFA) 108 (90 Base) MCG/ACT inhaler Inhale 2 puffs into the lungs every 6 (six) hours as needed for wheezing or shortness of breath. (Patient not taking: Reported on 10/13/2021) 8 g 0   methocarbamol (ROBAXIN) 500 MG tablet Take 500 mg by mouth every 8 (eight) hours. (Patient not taking: Reported on 10/13/2021)     ondansetron (ZOFRAN-ODT) 4 MG disintegrating tablet Take 4 mg by mouth every 8 (eight) hours as needed. (Patient not taking: Reported on 10/13/2021)     No current facility-administered medications for this visit.   Facility-Administered Medications Ordered in Other Visits  Medication Dose Route Frequency Provider Last Rate Last Admin   botulinum toxin Type A (BOTOX) injection 100 Units  100 Units Intramuscular Once Winter, Christopher Aaron, MD        OBJECTIVE: Vitals:   10/13/21 1134  BP: 103/65  Pulse: 82  Resp: 18  SpO2: 100%     Body mass index is 29.59 kg/m.    ECOG FS:0 - Asymptomatic  General: Well-developed, well-nourished, no acute distress. Eyes: Pink conjunctiva, anicteric sclera. HEENT: Normocephalic, moist mucous membranes. Lungs: No audible wheezing or coughing. Heart: Regular rate and rhythm. Abdomen: Soft, nontender, no obvious distention. Musculoskeletal: No edema, cyanosis, or clubbing. Neuro: Alert, answering all questions appropriately. Cranial nerves grossly intact. Skin: No rashes or petechiae noted. Psych: Normal affect. Lymphatics: No cervical, calvicular, axillary or inguinal LAD.   LAB RESULTS:  Lab Results  Component Value Date   NA 142 09/29/2021   K 4.0 09/29/2021   CL 104 09/29/2021   CO2 23 09/29/2021   GLUCOSE 81 09/29/2021   BUN 17 09/29/2021   CREATININE 0.76 09/29/2021   CALCIUM 9.3 09/29/2021   PROT 6.5 09/29/2021   ALBUMIN 4.2 09/29/2021   AST 23 09/29/2021   ALT 18 09/29/2021   ALKPHOS 70 09/29/2021   BILITOT 0.5 09/29/2021   GFRNONAA 75 12/25/2019   GFRAA 86 12/25/2019    Lab Results  Component Value Date   WBC  4.4 10/13/2021   NEUTROABS 1.6 09/29/2021   HGB 12.4 10/13/2021   HCT 37.6 10/13/2021   MCV 95.9 10/13/2021   PLT 117 (L) 10/13/2021     STUDIES: No results found.  ASSESSMENT: Thrombocytopenia, leukopenia.  PLAN:    Thrombocytopenia: Patient's platelet count is 117 today.  This appears to be approximately her baseline and likely residual from her stem cell transplant in 2017.  Patient is also on daily Bactrim which may be contributing.  Iron stores, B12, folate, and platelet antibody profile have been ordered for completeness and are pending at time of dictation.  No intervention is needed at this time.  Patient does not require bone marrow biopsy.  She will have video-assisted telemedicine visit in 3 weeks to discuss the results. Leukopenia: Resolved.  Patient's white blood cell count is within normal limits today.  Laboratory work as above.  Have also ordered peripheral blood flow cytometry for completeness. Multiple sclerosis: Patient is status post stem cell transplant in 2017. Gastric bypass: B12, folate, and iron stores are  within normal limits. Overactive bladder: Patient receives periodic Botox treatments and is on chronic Bactrim.  Patient expressed understanding and was in agreement with this plan. She also understands that She can call clinic at any time with any questions, concerns, or complaints.    Cancer Staging  No matching staging information was found for the patient.  Lloyd Huger, MD   10/14/2021 8:52 AM

## 2021-10-11 DIAGNOSIS — Z1211 Encounter for screening for malignant neoplasm of colon: Secondary | ICD-10-CM | POA: Diagnosis not present

## 2021-10-13 ENCOUNTER — Inpatient Hospital Stay: Payer: BLUE CROSS/BLUE SHIELD

## 2021-10-13 ENCOUNTER — Encounter: Payer: Self-pay | Admitting: Oncology

## 2021-10-13 ENCOUNTER — Inpatient Hospital Stay: Payer: BLUE CROSS/BLUE SHIELD | Attending: Oncology | Admitting: Oncology

## 2021-10-13 DIAGNOSIS — Z8249 Family history of ischemic heart disease and other diseases of the circulatory system: Secondary | ICD-10-CM | POA: Insufficient documentation

## 2021-10-13 DIAGNOSIS — D696 Thrombocytopenia, unspecified: Secondary | ICD-10-CM | POA: Diagnosis not present

## 2021-10-13 DIAGNOSIS — Z833 Family history of diabetes mellitus: Secondary | ICD-10-CM | POA: Diagnosis not present

## 2021-10-13 DIAGNOSIS — Z803 Family history of malignant neoplasm of breast: Secondary | ICD-10-CM | POA: Diagnosis not present

## 2021-10-13 DIAGNOSIS — Z841 Family history of disorders of kidney and ureter: Secondary | ICD-10-CM | POA: Diagnosis not present

## 2021-10-13 DIAGNOSIS — D72819 Decreased white blood cell count, unspecified: Secondary | ICD-10-CM | POA: Diagnosis not present

## 2021-10-13 DIAGNOSIS — Z9484 Stem cells transplant status: Secondary | ICD-10-CM | POA: Diagnosis not present

## 2021-10-13 DIAGNOSIS — Z809 Family history of malignant neoplasm, unspecified: Secondary | ICD-10-CM | POA: Diagnosis not present

## 2021-10-13 DIAGNOSIS — G35 Multiple sclerosis: Secondary | ICD-10-CM | POA: Insufficient documentation

## 2021-10-13 DIAGNOSIS — Z9884 Bariatric surgery status: Secondary | ICD-10-CM | POA: Insufficient documentation

## 2021-10-13 DIAGNOSIS — Z79899 Other long term (current) drug therapy: Secondary | ICD-10-CM | POA: Diagnosis not present

## 2021-10-13 LAB — CBC
HCT: 37.6 % (ref 36.0–46.0)
Hemoglobin: 12.4 g/dL (ref 12.0–15.0)
MCH: 31.6 pg (ref 26.0–34.0)
MCHC: 33 g/dL (ref 30.0–36.0)
MCV: 95.9 fL (ref 80.0–100.0)
Platelets: 117 10*3/uL — ABNORMAL LOW (ref 150–400)
RBC: 3.92 MIL/uL (ref 3.87–5.11)
RDW: 13.9 % (ref 11.5–15.5)
WBC: 4.4 10*3/uL (ref 4.0–10.5)
nRBC: 0 % (ref 0.0–0.2)

## 2021-10-13 LAB — FOLATE: Folate: 28 ng/mL (ref >5.9)

## 2021-10-13 LAB — VITAMIN B12: Vitamin B-12: 1317 pg/mL — ABNORMAL HIGH (ref 180–914)

## 2021-10-15 LAB — COMP PANEL: LEUKEMIA/LYMPHOMA

## 2021-10-15 LAB — PLATELET ANTIBODY PROFILE
Glycoprotein IV Antibody: NEGATIVE
HLA Ab Ser Ql EIA: POSITIVE — AB
IA/IIA Antibody: NEGATIVE
IB/IX Antibody: NEGATIVE
IIB/IIIA Antibody: NEGATIVE

## 2021-10-16 LAB — FERRITIN: Ferritin: 85 ng/mL (ref 11–307)

## 2021-10-16 LAB — IRON AND TIBC
Iron: 73 ug/dL (ref 28–170)
Saturation Ratios: 24 % (ref 10.4–31.8)
TIBC: 304 ug/dL (ref 250–450)
UIBC: 231 ug/dL

## 2021-10-20 LAB — NEUTROPHIL AB TEST LEVEL 1

## 2021-10-23 LAB — COLOGUARD: COLOGUARD: NEGATIVE

## 2021-10-25 NOTE — Progress Notes (Signed)
Hi Shiana.  Your Cologuard was negative. We will repeat it in 3 years.

## 2021-11-03 DIAGNOSIS — R339 Retention of urine, unspecified: Secondary | ICD-10-CM | POA: Diagnosis not present

## 2021-11-04 ENCOUNTER — Telehealth: Payer: BLUE CROSS/BLUE SHIELD | Admitting: Oncology

## 2021-11-04 DIAGNOSIS — R350 Frequency of micturition: Secondary | ICD-10-CM | POA: Diagnosis not present

## 2021-11-04 DIAGNOSIS — N3941 Urge incontinence: Secondary | ICD-10-CM | POA: Diagnosis not present

## 2021-11-18 ENCOUNTER — Encounter: Payer: Self-pay | Admitting: Oncology

## 2021-11-18 ENCOUNTER — Inpatient Hospital Stay: Payer: BLUE CROSS/BLUE SHIELD | Attending: Oncology | Admitting: Oncology

## 2021-11-18 DIAGNOSIS — R7989 Other specified abnormal findings of blood chemistry: Secondary | ICD-10-CM | POA: Diagnosis not present

## 2021-11-18 DIAGNOSIS — M791 Myalgia, unspecified site: Secondary | ICD-10-CM | POA: Diagnosis not present

## 2021-11-18 DIAGNOSIS — M25562 Pain in left knee: Secondary | ICD-10-CM | POA: Diagnosis not present

## 2021-11-18 DIAGNOSIS — M25561 Pain in right knee: Secondary | ICD-10-CM | POA: Diagnosis not present

## 2021-11-18 DIAGNOSIS — D696 Thrombocytopenia, unspecified: Secondary | ICD-10-CM | POA: Diagnosis not present

## 2021-11-18 DIAGNOSIS — G35 Multiple sclerosis: Secondary | ICD-10-CM | POA: Diagnosis not present

## 2021-11-18 DIAGNOSIS — G8929 Other chronic pain: Secondary | ICD-10-CM | POA: Diagnosis not present

## 2021-11-18 NOTE — Progress Notes (Signed)
[place]  Telephone:(336) [phone] Fax:(336) [phone]  ID: Christy Cox OB: [birth year]  MR#: [id number]  PXT#:[id number]  Patient Care Team: Jon Billings, NP as PCP - General  I connected with Christy Cox on 11/18/21 at  3:30 PM EDT by video enabled telemedicine visit and verified that I am speaking with the correct person using two identifiers.   I discussed the limitations, risks, security and privacy concerns of performing an evaluation and management service by telemedicine and the availability of in-person appointments. I also discussed with the patient that there may be a patient responsible charge related to this service. The patient expressed understanding and agreed to proceed.   Other persons participating in the visit and their role in the encounter: Patient, MD.  Patient's location: Home. Provider's location: Clinic.  CHIEF COMPLAINT: Thrombocytopenia.  INTERVAL HISTORY: Patient agreed to video assisted telemedicine visit for further evaluation and discussion of her laboratory results.  She continues to feel well and remains asymptomatic. She has no neurologic complaints.  She denies any recent fevers or illnesses.  She has good appetite and denies weight loss.  She has no chest pain, shortness of breath, cough, or hemoptysis.  She denies any nausea, vomiting, constipation, or diarrhea.  She has no urinary complaints.  Offers no specific complaints today.  REVIEW OF SYSTEMS:   Review of Systems  Constitutional: Negative.  Negative for fever, malaise/fatigue and weight loss.  Respiratory: Negative.  Negative for cough, hemoptysis and shortness of breath.   Cardiovascular: Negative.  Negative for chest pain and leg swelling.  Gastrointestinal: Negative.  Negative for abdominal pain.  Genitourinary: Negative.  Negative for dysuria.  Musculoskeletal: Negative.  Negative for back pain.  Skin: Negative.  Negative for rash.  Neurological: Negative.   Negative for dizziness, focal weakness, weakness and headaches.  Endo/Heme/Allergies:  Does not bruise/bleed easily.  Psychiatric/Behavioral: Negative.  The patient is not nervous/anxious.     As per HPI. Otherwise, a complete review of systems is negative.  PAST MEDICAL HISTORY: Past Medical History:  Diagnosis Date   H/O stem cell transplant ([place]) 08-05-2015   [place] , [place]   Limbal Stem Cell Transplant after chemotherapy   Multiple sclerosis [facility]) previous neurologist-- [facility]  since stem cell transplant in 08/05/2015 done in [place], [place]    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   OAB (overactive bladder)    PONV (postoperative nausea and vomiting)    severe    PAST SURGICAL HISTORY: Past Surgical History:  Procedure Laterality Date   CESAREAN SECTION  1999; 2003   BILATERAL TUBAL LIGATION W/ LAST C/S   CYSTO/  BOTOX INJECTION  multiple-- last one 2018 @ [facility] N/A 09/26/2017   Procedure: CYSTOSCOPY WITH INJECTION/ BOTOX;  Surgeon: Ceasar Mons, MD;  Location: [facility];  Service: Urology;  Laterality: N/A;   HERNIA REPAIR  age 84 and age 50    FAMILY HISTORY: Family History  Problem Relation Age of Onset   Breast cancer Mother 27   Heart attack Father 54       x2 at 38 them at 56, now s/p CABG   Kidney disease Father    Cancer Other    Coronary artery disease Other    Diabetes Other    Cancer Paternal Grandmother     ADVANCED DIRECTIVES (Y/N):  N  HEALTH MAINTENANCE: Social History   Tobacco Use   Smoking status: Never  Smokeless tobacco: Never  Vaping Use   Vaping Use: Never used  Substance Use Topics   Alcohol use: Not Currently   Drug use: No     Colonoscopy:  PAP:  Bone density:  Lipid panel:  Allergies  Allergen Reactions   Macrodantin [Nitrofurantoin Macrocrystal] Other (See Comments)    Caused elevated liver function and jaundice    Current  Outpatient Medications  Medication Sig Dispense Refill   Acetaminophen (TYLENOL 8 HOUR PO) Take 250 mg by mouth at bedtime.     methocarbamol (ROBAXIN) 500 MG tablet Take 500 mg by mouth every 8 (eight) hours.     mirabegron ER (MYRBETRIQ) 25 MG TB24 tablet Take 50 mg by mouth daily.     sulfamethoxazole-trimethoprim (BACTRIM DS) 800-160 MG tablet Take 1 tablet by mouth daily.     albuterol (VENTOLIN HFA) 108 (90 Base) MCG/ACT inhaler Inhale 2 puffs into the lungs every 6 (six) hours as needed for wheezing or shortness of breath. (Patient not taking: Reported on 10/13/2021) 8 g 0   budesonide-formoterol (SYMBICORT) 160-4.5 MCG/ACT inhaler INHALE 2 PUFFS BY MOUTH INTO THE LUNGS DAILY (Patient not taking: Reported on 11/18/2021) 10.2 each 2   ondansetron (ZOFRAN-ODT) 4 MG disintegrating tablet Take 4 mg by mouth every 8 (eight) hours as needed. (Patient not taking: Reported on 11/18/2021)     No current facility-administered medications for this visit.   Facility-Administered Medications Ordered in Other Visits  Medication Dose Route Frequency Provider Last Rate Last Admin   botulinum toxin Type A (BOTOX) injection 100 Units  100 Units Intramuscular Once Winter, Christopher Aaron, MD        OBJECTIVE: There were no vitals filed for this visit.    There is no height or weight on file to calculate BMI.    ECOG FS:0 - Asymptomatic  General: Well-developed, well-nourished, no acute distress. HEENT: Normocephalic. Neuro: Alert, answering all questions appropriately. Cranial nerves grossly intact. Psych: Normal affect.   LAB RESULTS:  Lab Results  Component Value Date   NA 142 09/29/2021   K 4.0 09/29/2021   CL 104 09/29/2021   CO2 23 09/29/2021   GLUCOSE 81 09/29/2021   BUN 17 09/29/2021   CREATININE 0.76 09/29/2021   CALCIUM 9.3 09/29/2021   PROT 6.5 09/29/2021   ALBUMIN 4.2 09/29/2021   AST 23 09/29/2021   ALT 18 09/29/2021   ALKPHOS 70 09/29/2021   BILITOT 0.5 09/29/2021    GFRNONAA 75 12/25/2019   GFRAA 86 12/25/2019    Lab Results  Component Value Date   WBC 4.4 10/13/2021   NEUTROABS 1.6 09/29/2021   HGB 12.4 10/13/2021   HCT 37.6 10/13/2021   MCV 95.9 10/13/2021   PLT 117 (L) 10/13/2021     STUDIES: No results found.  ASSESSMENT: Thrombocytopenia, leukopenia.  PLAN:    Thrombocytopenia: Patient's most recent platelet count was reported at 117.  She reports a CBC earlier today at an outside lab with a platelet count of 104.  All of her other laboratory work, other than a positive platelet antibody profile, is either negative or within normal limits.  These can be transient in nature and will repeat in the near future.  No intervention is needed at this time.  Patient does not require bone marrow biopsy.  Patient does not require treatment for her thrombocytopenia.  Return to clinic in 3 months with laboratory work and video assisted telemedicine visit.  Leukopenia: Resolved.  Patient's white blood cell count is within normal limits today.  Laboratory work as above.  Peripheral blood flow cytometry is within normal limits.   Multiple sclerosis: Patient is status post stem cell transplant in 2017. Gastric bypass: B12, folate, and iron stores are within normal limits. Overactive bladder: Patient receives periodic Botox treatments and is on chronic Bactrim.  I provided 20 minutes of face-to-face video visit time during this encounter which included chart review, counseling, and coordination of care as documented above.   Patient expressed understanding and was in agreement with this plan. She also understands that She can call clinic at any time with any questions, concerns, or complaints.     Lloyd Huger, MD   11/18/2021 4:53 PM

## 2021-11-24 DIAGNOSIS — G35 Multiple sclerosis: Secondary | ICD-10-CM | POA: Diagnosis not present

## 2021-11-25 DIAGNOSIS — Z9884 Bariatric surgery status: Secondary | ICD-10-CM | POA: Diagnosis not present

## 2021-11-25 DIAGNOSIS — Z713 Dietary counseling and surveillance: Secondary | ICD-10-CM | POA: Diagnosis not present

## 2021-11-25 LAB — HM PAP SMEAR

## 2021-12-09 DIAGNOSIS — M2550 Pain in unspecified joint: Secondary | ICD-10-CM | POA: Diagnosis not present

## 2021-12-09 DIAGNOSIS — Z713 Dietary counseling and surveillance: Secondary | ICD-10-CM | POA: Diagnosis not present

## 2021-12-15 DIAGNOSIS — R339 Retention of urine, unspecified: Secondary | ICD-10-CM | POA: Diagnosis not present

## 2022-01-17 DIAGNOSIS — M791 Myalgia, unspecified site: Secondary | ICD-10-CM | POA: Diagnosis not present

## 2022-01-17 DIAGNOSIS — G35 Multiple sclerosis: Secondary | ICD-10-CM | POA: Diagnosis not present

## 2022-01-23 DIAGNOSIS — R339 Retention of urine, unspecified: Secondary | ICD-10-CM | POA: Diagnosis not present

## 2022-02-15 ENCOUNTER — Inpatient Hospital Stay: Payer: BLUE CROSS/BLUE SHIELD | Attending: Oncology

## 2022-02-15 DIAGNOSIS — G35 Multiple sclerosis: Secondary | ICD-10-CM | POA: Diagnosis not present

## 2022-02-15 DIAGNOSIS — D696 Thrombocytopenia, unspecified: Secondary | ICD-10-CM

## 2022-02-15 DIAGNOSIS — D693 Immune thrombocytopenic purpura: Secondary | ICD-10-CM | POA: Diagnosis not present

## 2022-02-15 DIAGNOSIS — Z803 Family history of malignant neoplasm of breast: Secondary | ICD-10-CM | POA: Diagnosis not present

## 2022-02-15 DIAGNOSIS — N3281 Overactive bladder: Secondary | ICD-10-CM | POA: Insufficient documentation

## 2022-02-15 DIAGNOSIS — Z8249 Family history of ischemic heart disease and other diseases of the circulatory system: Secondary | ICD-10-CM | POA: Insufficient documentation

## 2022-02-15 DIAGNOSIS — Z9484 Stem cells transplant status: Secondary | ICD-10-CM | POA: Insufficient documentation

## 2022-02-15 DIAGNOSIS — Z9884 Bariatric surgery status: Secondary | ICD-10-CM | POA: Insufficient documentation

## 2022-02-15 DIAGNOSIS — Z841 Family history of disorders of kidney and ureter: Secondary | ICD-10-CM | POA: Diagnosis not present

## 2022-02-15 DIAGNOSIS — Z7951 Long term (current) use of inhaled steroids: Secondary | ICD-10-CM | POA: Diagnosis not present

## 2022-02-15 DIAGNOSIS — Z79899 Other long term (current) drug therapy: Secondary | ICD-10-CM | POA: Diagnosis not present

## 2022-02-15 DIAGNOSIS — Z833 Family history of diabetes mellitus: Secondary | ICD-10-CM | POA: Diagnosis not present

## 2022-02-15 DIAGNOSIS — Z809 Family history of malignant neoplasm, unspecified: Secondary | ICD-10-CM | POA: Insufficient documentation

## 2022-02-15 LAB — CBC WITH DIFFERENTIAL/PLATELET
Abs Immature Granulocytes: 0.01 10*3/uL (ref 0.00–0.07)
Basophils Absolute: 0 10*3/uL (ref 0.0–0.1)
Basophils Relative: 1 %
Eosinophils Absolute: 0.2 10*3/uL (ref 0.0–0.5)
Eosinophils Relative: 4 %
HCT: 37.6 % (ref 36.0–46.0)
Hemoglobin: 12.7 g/dL (ref 12.0–15.0)
Immature Granulocytes: 0 %
Lymphocytes Relative: 42 %
Lymphs Abs: 1.7 10*3/uL (ref 0.7–4.0)
MCH: 32.1 pg (ref 26.0–34.0)
MCHC: 33.8 g/dL (ref 30.0–36.0)
MCV: 94.9 fL (ref 80.0–100.0)
Monocytes Absolute: 0.3 10*3/uL (ref 0.1–1.0)
Monocytes Relative: 8 %
Neutro Abs: 1.9 10*3/uL (ref 1.7–7.7)
Neutrophils Relative %: 45 %
Platelets: 119 10*3/uL — ABNORMAL LOW (ref 150–400)
RBC: 3.96 MIL/uL (ref 3.87–5.11)
RDW: 13.2 % (ref 11.5–15.5)
WBC: 4.1 10*3/uL (ref 4.0–10.5)
nRBC: 0 % (ref 0.0–0.2)

## 2022-02-16 LAB — PLATELET ANTIBODY PROFILE
Glycoprotein IV Antibody: NEGATIVE
HLA Ab Ser Ql EIA: POSITIVE — AB
IA/IIA Antibody: NEGATIVE
IB/IX Antibody: NEGATIVE
IIB/IIIA Antibody: NEGATIVE

## 2022-02-17 ENCOUNTER — Telehealth: Payer: BLUE CROSS/BLUE SHIELD | Admitting: Oncology

## 2022-02-22 ENCOUNTER — Encounter: Payer: Self-pay | Admitting: Oncology

## 2022-02-22 ENCOUNTER — Inpatient Hospital Stay (HOSPITAL_BASED_OUTPATIENT_CLINIC_OR_DEPARTMENT_OTHER): Payer: BLUE CROSS/BLUE SHIELD | Admitting: Oncology

## 2022-02-22 DIAGNOSIS — R339 Retention of urine, unspecified: Secondary | ICD-10-CM | POA: Diagnosis not present

## 2022-02-22 DIAGNOSIS — D693 Immune thrombocytopenic purpura: Secondary | ICD-10-CM | POA: Diagnosis not present

## 2022-02-22 NOTE — Progress Notes (Signed)
[place]  Telephone:(336) [phone] Fax:(336) [phone]  ID: Christy Cox OB: Feb 21, 1976  MR#: [id number]  CSN#:[id number]  Patient Care Team: Jon Billings, NP as PCP - General  I connected with Christy Cox on 02/23/22 at  3:30 PM EST by video enabled telemedicine visit and verified that I am speaking with the correct person using two identifiers.   I discussed the limitations, risks, security and privacy concerns of performing an evaluation and management service by telemedicine and the availability of in-person appointments. I also discussed with the patient that there may be a patient responsible charge related to this service. The patient expressed understanding and agreed to proceed.   Other persons participating in the visit and their role in the encounter: Patient, MD.  Patient's location: Home. Provider's location: Clinic.  CHIEF COMPLAINT: Autoimmune thrombocytopenia.  INTERVAL HISTORY: Patient agreed to video assisted telemedicine visit for further evaluation and discussion of her laboratory results.  She continues to feel well and remains asymptomatic.  She has no neurologic complaints.  She denies any recent fevers or illnesses.  She has good appetite and denies weight loss.  She has no chest pain, shortness of breath, cough, or hemoptysis.  She denies any nausea, vomiting, constipation, or diarrhea.  She has no urinary complaints.  Patient offers no specific complaints today.  REVIEW OF SYSTEMS:   Review of Systems  Constitutional: Negative.  Negative for fever, malaise/fatigue and weight loss.  Respiratory: Negative.  Negative for cough, hemoptysis and shortness of breath.   Cardiovascular: Negative.  Negative for chest pain and leg swelling.  Gastrointestinal: Negative.  Negative for abdominal pain.  Genitourinary: Negative.  Negative for dysuria.  Musculoskeletal: Negative.  Negative for back pain.  Skin: Negative.  Negative for rash.   Neurological: Negative.  Negative for dizziness, focal weakness, weakness and headaches.  Endo/Heme/Allergies:  Does not bruise/bleed easily.  Psychiatric/Behavioral: Negative.  The patient is not nervous/anxious.     As per HPI. Otherwise, a complete review of systems is negative.  PAST MEDICAL HISTORY: Past Medical History:  Diagnosis Date   H/O stem cell transplant ([place]) 08-05-2015   [place] , [place]   Limbal Stem Cell Transplant after chemotherapy   Multiple sclerosis [facility]) previous neurologist-- [facility]  since stem cell transplant in 08/05/2015 done in [place]    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   OAB (overactive bladder)    PONV (postoperative nausea and vomiting)    severe    PAST SURGICAL HISTORY: Past Surgical History:  Procedure Laterality Date   CESAREAN SECTION  1999; 2003   BILATERAL TUBAL LIGATION W/ LAST C/S   CYSTO/  BOTOX INJECTION  multiple-- last one 2018 @ [facility] N/A 09/26/2017   Procedure: CYSTOSCOPY WITH INJECTION/ BOTOX;  Surgeon: Ceasar Mons, MD;  Location: [facility];  Service: Urology;  Laterality: N/A;   HERNIA REPAIR  age 56 and age 25    FAMILY HISTORY: Family History  Problem Relation Age of Onset   Breast cancer Mother 68   Heart attack Father 13       x2 at 39 them at 45, now s/p CABG   Kidney disease Father    Cancer Other    Coronary artery disease Other    Diabetes Other    Cancer Paternal Grandmother     ADVANCED DIRECTIVES (Y/N):  N  HEALTH MAINTENANCE: Social History   Tobacco Use   Smoking  status: Never   Smokeless tobacco: Never  Vaping Use   Vaping Use: Never used  Substance Use Topics   Alcohol use: Not Currently   Drug use: No     Colonoscopy:  PAP:  Bone density:  Lipid panel:  Allergies  Allergen Reactions   Macrodantin [Nitrofurantoin Macrocrystal] Other (See Comments)    Caused elevated liver function and  jaundice    Current Outpatient Medications  Medication Sig Dispense Refill   Acetaminophen (TYLENOL 8 HOUR PO) Take 250 mg by mouth at bedtime.     DULoxetine (CYMBALTA) 30 MG capsule Take 30 mg by mouth.     methocarbamol (ROBAXIN) 500 MG tablet Take 500 mg by mouth every 8 (eight) hours.     mirabegron ER (MYRBETRIQ) 25 MG TB24 tablet Take 50 mg by mouth daily.     albuterol (VENTOLIN HFA) 108 (90 Base) MCG/ACT inhaler Inhale 2 puffs into the lungs every 6 (six) hours as needed for wheezing or shortness of breath. (Patient not taking: Reported on 10/13/2021) 8 g 0   budesonide-formoterol (SYMBICORT) 160-4.5 MCG/ACT inhaler INHALE 2 PUFFS BY MOUTH INTO THE LUNGS DAILY (Patient not taking: Reported on 11/18/2021) 10.2 each 2   ondansetron (ZOFRAN-ODT) 4 MG disintegrating tablet Take 4 mg by mouth every 8 (eight) hours as needed. (Patient not taking: Reported on 11/18/2021)     sulfamethoxazole-trimethoprim (BACTRIM DS) 800-160 MG tablet Take 1 tablet by mouth daily. (Patient not taking: Reported on 02/22/2022)     No current facility-administered medications for this visit.   Facility-Administered Medications Ordered in Other Visits  Medication Dose Route Frequency Provider Last Rate Last Admin   botulinum toxin Type A (BOTOX) injection 100 Units  100 Units Intramuscular Once Winter, Christopher Aaron, MD        OBJECTIVE: There were no vitals filed for this visit.    There is no height or weight on file to calculate BMI.    ECOG FS:0 - Asymptomatic  General: Well-developed, well-nourished, no acute distress. HEENT: Normocephalic. Neuro: Alert, answering all questions appropriately. Cranial nerves grossly intact. Psych: Normal affect.  LAB RESULTS:  Lab Results  Component Value Date   NA 142 09/29/2021   K 4.0 09/29/2021   CL 104 09/29/2021   CO2 23 09/29/2021   GLUCOSE 81 09/29/2021   BUN 17 09/29/2021   CREATININE 0.76 09/29/2021   CALCIUM 9.3 09/29/2021   PROT 6.5 09/29/2021    ALBUMIN 4.2 09/29/2021   AST 23 09/29/2021   ALT 18 09/29/2021   ALKPHOS 70 09/29/2021   BILITOT 0.5 09/29/2021   GFRNONAA 75 12/25/2019   GFRAA 86 12/25/2019    Lab Results  Component Value Date   WBC 4.1 02/15/2022   NEUTROABS 1.9 02/15/2022   HGB 12.7 02/15/2022   HCT 37.6 02/15/2022   MCV 94.9 02/15/2022   PLT 119 (L) 02/15/2022     STUDIES: No results found.  ASSESSMENT: Autoimmune thrombocytopenia.  PLAN:    Autoimmune thrombocytopenia: Patient noted to have positive platelet antibodies on 2 separate occasions 4 months apart.  Her platelet count remains decreased, but relatively stable at 119.  All of her other laboratory work was either negative or within normal limits.  No intervention or treatment is needed at this time.  If patient's platelet count falls below 50, could consider steroid taper or burst for treatment.  After lengthy discussion with the patient, it was agreed upon that no further follow-up is necessary.  Please continue to monitor CBC 2-3 times per  year and refer patient back if platelet count falls below 100. Leukopenia: Resolved. Laboratory work as above.  Peripheral blood flow cytometry is within normal limits.   Multiple sclerosis: Patient is status post stem cell transplant in 2017. Gastric bypass: B12, folate, and iron stores are within normal limits. Overactive bladder: Patient receives periodic Botox treatments and is on chronic Bactrim.  I spent a total of 20 minutes reviewing chart data, face-to-face evaluation with the patient, counseling and coordination of care as detailed above.    Patient expressed understanding and was in agreement with this plan. She also understands that She can call clinic at any time with any questions, concerns, or complaints.     Lloyd Huger, MD   02/23/2022 8:50 AM

## 2022-02-26 ENCOUNTER — Telehealth: Payer: BLUE CROSS/BLUE SHIELD | Admitting: Emergency Medicine

## 2022-02-26 DIAGNOSIS — R350 Frequency of micturition: Secondary | ICD-10-CM | POA: Diagnosis not present

## 2022-02-26 MED ORDER — CIPROFLOXACIN HCL 500 MG PO TABS: 500.0000 mg | ORAL_TABLET | Freq: Two times a day (BID) | ORAL | 0 refills | Status: AC

## 2022-02-26 NOTE — Progress Notes (Signed)
Virtual Visit Consent   Christy Cox, you are scheduled for a virtual visit with a [facility] provider today. Just as with appointments in the office, your consent must be obtained to participate. Your consent will be active for this visit and any virtual visit you may have with one of our providers in the next 365 days. If you have a MyChart account, a copy of this consent can be sent to you electronically.  As this is a virtual visit, video technology does not allow for your provider to perform a traditional examination. This may limit your provider's ability to fully assess your condition. If your provider identifies any concerns that need to be evaluated in person or the need to arrange testing (such as labs, EKG, etc.), we will make arrangements to do so. Although advances in technology are sophisticated, we cannot ensure that it will always work on either your end or our end. If the connection with a video visit is poor, the visit may have to be switched to a telephone visit. With either a video or telephone visit, we are not always able to ensure that we have a secure connection.  By engaging in this virtual visit, you consent to the provision of healthcare and authorize for your insurance to be billed (if applicable) for the services provided during this visit. Depending on your insurance coverage, you may receive a charge related to this service.  I need to obtain your verbal consent now. Are you willing to proceed with your visit today? Christy Cox has provided verbal consent on 02/26/2022 for a virtual visit (video or telephone). Christy Circle, PA-C  Date: 02/26/2022 11:43 AM  Virtual Visit via Video Note   I, Christy Cox, connected with  Christy Cox  ([id number], 06-24-1975) on 02/26/22 at 11:45 AM EST by a video-enabled telemedicine application and verified that I am speaking with the correct person using two identifiers.  Location: Patient: Virtual Visit Location  Patient: Home Provider: Virtual Visit Location Provider: Home Office   I discussed the limitations of evaluation and management by telemedicine and the availability of in person appointments. The patient expressed understanding and agreed to proceed.    History of Present Illness: Christy Cox is a 46 y.o. who identifies as a female who was assigned female at birth, and is being seen today for urinary frequency.  States that she has hx of MS that causes incomplete bladder emptying.  She states that she self-caths multiple times daily.  She denies fever or vomiting.  States that this is how she feels when she has a UTI.  She does take Bactrim daily, but states that she sometimes needs an additional antibiotic such as cipro, which has worked well in the past.  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   Overactive bladder 05/24/2013   Incomplete bladder emptying 05/24/2013   Urinary retention 05/24/2013   Multiple sclerosis ([place]) 05/31/2012   DS (disseminated sclerosis) ([place]) 05/31/2012    Allergies:  Allergies  Allergen Reactions   Macrodantin [Nitrofurantoin Macrocrystal] Other (See Comments)    Caused elevated liver function and jaundice   Medications:  Current Outpatient Medications:    Acetaminophen (TYLENOL 8 HOUR PO), Take 250 mg by mouth at bedtime., Disp: , Rfl:    albuterol (  VENTOLIN HFA) 108 (90 Base) MCG/ACT inhaler, Inhale 2 puffs into the lungs every 6 (six) hours as needed for wheezing or shortness of breath. (Patient not taking: Reported on 10/13/2021), Disp: 8 g, Rfl: 0   budesonide-formoterol (SYMBICORT) 160-4.5 MCG/ACT inhaler, INHALE 2 PUFFS BY MOUTH INTO THE LUNGS DAILY (Patient not taking: Reported on 11/18/2021), Disp: 10.2 each, Rfl: 2   DULoxetine (CYMBALTA)  30 MG capsule, Take 30 mg by mouth., Disp: , Rfl:    methocarbamol (ROBAXIN) 500 MG tablet, Take 500 mg by mouth every 8 (eight) hours., Disp: , Rfl:    mirabegron ER (MYRBETRIQ) 25 MG TB24 tablet, Take 50 mg by mouth daily., Disp: , Rfl:    ondansetron (ZOFRAN-ODT) 4 MG disintegrating tablet, Take 4 mg by mouth every 8 (eight) hours as needed. (Patient not taking: Reported on 11/18/2021), Disp: , Rfl:    sulfamethoxazole-trimethoprim (BACTRIM DS) 800-160 MG tablet, Take 1 tablet by mouth daily. (Patient not taking: Reported on 02/22/2022), Disp: , Rfl:  No current facility-administered medications for this visit.  Facility-Administered Medications Ordered in Other Visits:    botulinum toxin Type A (BOTOX) injection 100 Units, 100 Units, Intramuscular, Once, Winter, Conception Oms, MD  Observations/Objective: Patient is well-developed, well-nourished in no acute distress.  Resting comfortably  at home.  Head is normocephalic, atraumatic.  No labored breathing.  Speech is clear and coherent with logical content.  Patient is alert and oriented at baseline.    Assessment and Plan: 1. Urinary frequency   Meds ordered this encounter  Medications   ciprofloxacin (CIPRO) 500 MG tablet    Sig: Take 1 tablet (500 mg total) by mouth 2 (two) times daily for 7 days.    Dispense:  14 tablet    Refill:  0    Order Specific Question:   Supervising Provider    Answer:   Christy Cox [[id number]]   Given the holiday weekend and that patient has experienced similar before, feel that it's appropriate to start treatment with Cipro.  She is encouraged to follow-up with her urologist on Tuesday.    Follow Up Instructions: I discussed the assessment and treatment plan with the patient. The patient was provided an opportunity to ask questions and all were answered. The patient agreed with the plan and demonstrated an understanding of the instructions.  A copy of instructions were sent to the patient  via MyChart unless otherwise noted below.     The patient was advised to call back or seek an in-person evaluation if the symptoms worsen or if the condition fails to improve as anticipated.  Time:  I spent 11 minutes with the patient via telehealth technology discussing the above problems/concerns.    Christy Circle, PA-C

## 2022-02-26 NOTE — Patient Instructions (Signed)
Christy Cox, thank you for joining [name], PA-C for today's virtual visit.  While this provider is not your primary care provider (PCP), if your PCP is located in our provider database this encounter information will be shared with them immediately following your visit.   [facility] account gives you access to today's visit and all your visits, tests, and labs performed at [facility] " click here if you don't have a [facility] account or go to [email or web address]  Consent: (Patient) Christy Cox provided verbal consent for this virtual visit at the beginning of the encounter.  Current Medications:  Current Outpatient Medications:    ciprofloxacin (CIPRO) 500 MG tablet, Take 1 tablet (500 mg total) by mouth 2 (two) times daily for 7 days., Disp: 14 tablet, Rfl: 0   Acetaminophen (TYLENOL 8 HOUR PO), Take 250 mg by mouth at bedtime., Disp: , Rfl:    albuterol (VENTOLIN HFA) 108 (90 Base) MCG/ACT inhaler, Inhale 2 puffs into the lungs every 6 (six) hours as needed for wheezing or shortness of breath. (Patient not taking: Reported on 10/13/2021), Disp: 8 g, Rfl: 0   budesonide-formoterol (SYMBICORT) 160-4.5 MCG/ACT inhaler, INHALE 2 PUFFS BY MOUTH INTO THE LUNGS DAILY (Patient not taking: Reported on 11/18/2021), Disp: 10.2 each, Rfl: 2   DULoxetine (CYMBALTA) 30 MG capsule, Take 30 mg by mouth., Disp: , Rfl:    methocarbamol (ROBAXIN) 500 MG tablet, Take 500 mg by mouth every 8 (eight) hours., Disp: , Rfl:    mirabegron ER (MYRBETRIQ) 25 MG TB24 tablet, Take 50 mg by mouth daily., Disp: , Rfl:    ondansetron (ZOFRAN-ODT) 4 MG disintegrating tablet, Take 4 mg by mouth every 8 (eight) hours as needed. (Patient not taking: Reported on 11/18/2021), Disp: , Rfl:    sulfamethoxazole-trimethoprim (BACTRIM DS) 800-160 MG tablet, Take 1 tablet by mouth daily. (Patient not taking: Reported on 02/22/2022), Disp: , Rfl:  No current facility-administered  medications for this visit.  Facility-Administered Medications Ordered in Other Visits:    botulinum toxin Type A (BOTOX) injection 100 Units, 100 Units, Intramuscular, Once, Winter, Conception Oms, MD   Medications ordered in this encounter:  Meds ordered this encounter  Medications   ciprofloxacin (CIPRO) 500 MG tablet    Sig: Take 1 tablet (500 mg total) by mouth 2 (two) times daily for 7 days.    Dispense:  14 tablet    Refill:  0    Order Specific Question:   Supervising Provider    Answer:   Chase Picket [id number]     *If you need refills on other medications prior to your next appointment, please contact your pharmacy*  Follow-Up: Call back or seek an in-person evaluation if the symptoms worsen or if the condition fails to improve as anticipated.  [facility] [phone]  Other Instructions Follow-up with your urologist on Tuesday and let them know that we started treatment with Cipro.   If you have been instructed to have an in-person evaluation today at a local Urgent Care facility, please use the link below. It will take you to a list of all of our available [facility], including address, phone number and hours of operation. Please do not delay care.  [facility]  If you or a family member do not have a primary care provider, use the link below to schedule a visit and establish care. When you choose a [facility] primary care physician or  advanced practice provider, you gain a long-term partner in health. Find a Primary Care Provider  Learn more about [facility]'s in-office and virtual care options: [facility] Now

## 2022-03-15 DIAGNOSIS — R399 Unspecified symptoms and signs involving the genitourinary system: Secondary | ICD-10-CM | POA: Diagnosis not present

## 2022-03-17 DIAGNOSIS — N3281 Overactive bladder: Secondary | ICD-10-CM | POA: Diagnosis not present

## 2022-03-17 DIAGNOSIS — N3941 Urge incontinence: Secondary | ICD-10-CM | POA: Diagnosis not present

## 2022-03-17 DIAGNOSIS — N390 Urinary tract infection, site not specified: Secondary | ICD-10-CM | POA: Diagnosis not present

## 2022-04-09 DIAGNOSIS — R339 Retention of urine, unspecified: Secondary | ICD-10-CM | POA: Diagnosis not present

## 2022-04-22 DIAGNOSIS — G35 Multiple sclerosis: Secondary | ICD-10-CM | POA: Diagnosis not present

## 2022-04-22 DIAGNOSIS — M791 Myalgia, unspecified site: Secondary | ICD-10-CM | POA: Diagnosis not present

## 2022-04-27 ENCOUNTER — Telehealth: Payer: BLUE CROSS/BLUE SHIELD | Admitting: Family Medicine

## 2022-04-27 DIAGNOSIS — J069 Acute upper respiratory infection, unspecified: Secondary | ICD-10-CM

## 2022-04-27 MED ORDER — PROMETHAZINE-DM 6.25-15 MG/5ML PO SYRP: 5.0000 mL | ORAL_SOLUTION | Freq: Four times a day (QID) | ORAL | 0 refills | Status: DC | PRN

## 2022-04-27 MED ORDER — FLUTICASONE PROPIONATE 50 MCG/ACT NA SUSP: 2.0000 | Freq: Every day | NASAL | 0 refills | Status: DC

## 2022-04-27 NOTE — Patient Instructions (Signed)
Christy Cox, thank you for joining Perlie Mayo, NP for today's virtual visit.  While this provider is not your primary care provider (PCP), if your PCP is located in our provider database this encounter information will be shared with them immediately following your visit.   [facility] account gives you access to today's visit and all your visits, tests, and labs performed at [facility] " click here if you don't have a [facility] account or go to [email or web address]  Consent: (Patient) Christy Cox provided verbal consent for this virtual visit at the beginning of the encounter.  Current Medications:  Current Outpatient Medications:    fluticasone (FLONASE) 50 MCG/ACT nasal spray, Place 2 sprays into both nostrils daily., Disp: 16 g, Rfl: 0   promethazine-dextromethorphan (PROMETHAZINE-DM) 6.25-15 MG/5ML syrup, Take 5 mLs by mouth 4 (four) times daily as needed for cough., Disp: 118 mL, Rfl: 0   Acetaminophen (TYLENOL 8 HOUR PO), Take 250 mg by mouth at bedtime., Disp: , Rfl:    albuterol (VENTOLIN HFA) 108 (90 Base) MCG/ACT inhaler, Inhale 2 puffs into the lungs every 6 (six) hours as needed for wheezing or shortness of breath. (Patient not taking: Reported on 10/13/2021), Disp: 8 g, Rfl: 0   budesonide-formoterol (SYMBICORT) 160-4.5 MCG/ACT inhaler, INHALE 2 PUFFS BY MOUTH INTO THE LUNGS DAILY (Patient not taking: Reported on 11/18/2021), Disp: 10.2 each, Rfl: 2   DULoxetine (CYMBALTA) 30 MG capsule, Take 30 mg by mouth., Disp: , Rfl:    methocarbamol (ROBAXIN) 500 MG tablet, Take 500 mg by mouth every 8 (eight) hours., Disp: , Rfl:    mirabegron ER (MYRBETRIQ) 25 MG TB24 tablet, Take 50 mg by mouth daily., Disp: , Rfl:    ondansetron (ZOFRAN-ODT) 4 MG disintegrating tablet, Take 4 mg by mouth every 8 (eight) hours as needed. (Patient not taking: Reported on 11/18/2021), Disp: , Rfl:    sulfamethoxazole-trimethoprim (BACTRIM DS) 800-160 MG tablet,  Take 1 tablet by mouth daily. (Patient not taking: Reported on 02/22/2022), Disp: , Rfl:  No current facility-administered medications for this visit.  Facility-Administered Medications Ordered in Other Visits:    botulinum toxin Type A (BOTOX) injection 100 Units, 100 Units, Intramuscular, Once, Winter, Conception Oms, MD   Medications ordered in this encounter:  Meds ordered this encounter  Medications   fluticasone (FLONASE) 50 MCG/ACT nasal spray    Sig: Place 2 sprays into both nostrils daily.    Dispense:  16 g    Refill:  0    Order Specific Question:   Supervising Provider    Answer:   Chase Picket [id number]   promethazine-dextromethorphan (PROMETHAZINE-DM) 6.25-15 MG/5ML syrup    Sig: Take 5 mLs by mouth 4 (four) times daily as needed for cough.    Dispense:  118 mL    Refill:  0    Order Specific Question:   Supervising Provider    Answer:   Chase Picket [id number]     *If you need refills on other medications prior to your next appointment, please contact your pharmacy*  Follow-Up: Call back or seek an in-person evaluation if the symptoms worsen or if the condition fails to improve as anticipated.  [place] [phone]  Other Instructions  - Take meds as prescribed - Rest voice - Use a cool mist humidifier especially during the winter months when heat dries out the air. - Use saline nose sprays frequently to help soothe nasal passages if they are  drying out. - Stay hydrated by drinking plenty of fluids - Keep thermostat turn down low to prevent drying out which can cause a dry cough. - For any cough or congestion- robitussin DM or Delsym as needed - For fever or aches or pains- take tylenol or ibuprofen as directed on bottle             * for fevers greater than 101 orally you may alternate ibuprofen and tylenol every 3 hours.  If you do not improve you will need a follow up visit in person.                  If you have been  instructed to have an in-person evaluation today at a local Urgent Care facility, please use the link below. It will take you to a list of all of our available [facility], including address, phone number and hours of operation. Please do not delay care.  [facility]  If you or a family member do not have a primary care provider, use the link below to schedule a visit and establish care. When you choose a [facility] primary care physician or advanced practice provider, you gain a long-term partner in health. Find a Primary Care Provider  Learn more about [facility]'s in-office and virtual care options: [facility]

## 2022-04-27 NOTE — Progress Notes (Signed)
Virtual Visit Consent   Christy Cox, you are scheduled for a virtual visit with a [facility] provider today. Just as with appointments in the office, your consent must be obtained to participate. Your consent will be active for this visit and any virtual visit you may have with one of our providers in the next 365 days. If you have a MyChart account, a copy of this consent can be sent to you electronically.  As this is a virtual visit, video technology does not allow for your provider to perform a traditional examination. This may limit your provider's ability to fully assess your condition. If your provider identifies any concerns that need to be evaluated in person or the need to arrange testing (such as labs, EKG, etc.), we will make arrangements to do so. Although advances in technology are sophisticated, we cannot ensure that it will always work on either your end or our end. If the connection with a video visit is poor, the visit may have to be switched to a telephone visit. With either a video or telephone visit, we are not always able to ensure that we have a secure connection.  By engaging in this virtual visit, you consent to the provision of healthcare and authorize for your insurance to be billed (if applicable) for the services provided during this visit. Depending on your insurance coverage, you may receive a charge related to this service.  I need to obtain your verbal consent now. Are you willing to proceed with your visit today? Christy Cox has provided verbal consent on 04/27/2022 for a virtual visit (video or telephone). Christy Mayo, NP  Date: 04/27/2022 3:07 PM  Virtual Visit via Video Note   I, Christy Cox, connected with  Christy Cox  ([id number], 04-Jun-1975) on 04/27/22 at  3:00 PM EST by a video-enabled telemedicine application and verified that I am speaking with the correct person using two identifiers.  Location: Patient: Virtual Visit Location Patient:  Home Provider: Virtual Visit Location Provider: Home Office   I discussed the limitations of evaluation and management by telemedicine and the availability of in person appointments. The patient expressed understanding and agreed to proceed.    History of Present Illness: Christy Cox is a 47 y.o. 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   Recurrent UTI 02/03/2018   Strain of trapezius muscle 12/06/2017   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   Overactive bladder 05/24/2013   Incomplete bladder emptying 05/24/2013   Urinary retention 05/24/2013   Multiple sclerosis ([place]) 05/31/2012   DS (disseminated sclerosis) ([place]) 05/31/2012    Allergies:  Allergies  Allergen Reactions   Macrodantin [Nitrofurantoin Macrocrystal] Other (See Comments)    Caused elevated liver function and jaundice   Medications:  Current Outpatient Medications:    Acetaminophen (TYLENOL 8 HOUR PO), Take 250 mg by mouth at bedtime., Disp: , Rfl:    albuterol (VENTOLIN HFA) 108 (90 Base) MCG/ACT inhaler, Inhale 2 puffs into the lungs  every 6 (six) hours as needed for wheezing or shortness of breath. (Patient not taking: Reported on 10/13/2021), Disp: 8 g, Rfl: 0   budesonide-formoterol (SYMBICORT) 160-4.5 MCG/ACT inhaler, INHALE 2 PUFFS BY MOUTH INTO THE LUNGS DAILY (Patient not taking: Reported on 11/18/2021), Disp: 10.2 each, Rfl: 2   DULoxetine (CYMBALTA) 30 MG capsule, Take 30 mg by mouth.,  Disp: , Rfl:    methocarbamol (ROBAXIN) 500 MG tablet, Take 500 mg by mouth every 8 (eight) hours., Disp: , Rfl:    mirabegron ER (MYRBETRIQ) 25 MG TB24 tablet, Take 50 mg by mouth daily., Disp: , Rfl:    ondansetron (ZOFRAN-ODT) 4 MG disintegrating tablet, Take 4 mg by mouth every 8 (eight) hours as needed. (Patient not taking: Reported on 11/18/2021), Disp: , Rfl:    sulfamethoxazole-trimethoprim (BACTRIM DS) 800-160 MG tablet, Take 1 tablet by mouth daily. (Patient not taking: Reported on 02/22/2022), Disp: , Rfl:  No current facility-administered medications for this visit.  Facility-Administered Medications Ordered in Other Visits:    botulinum toxin Type A (BOTOX) injection 100 Units, 100 Units, Intramuscular, Once, Winter, Conception Oms, MD  Observations/Objective: Patient is well-developed, well-nourished in no acute distress.  Resting comfortably  at home.  Head is normocephalic, atraumatic.  No labored breathing.  Speech is clear and coherent with logical content.  Patient is alert and oriented at baseline.    Assessment and Plan:  1. Viral URI with cough  - fluticasone (FLONASE) 50 MCG/ACT nasal spray; Place 2 sprays into both nostrils daily.  Dispense: 16 g; Refill: 0 - promethazine-dextromethorphan (PROMETHAZINE-DM) 6.25-15 MG/5ML syrup; Take 5 mLs by mouth 4 (four) times daily as needed for cough.  Dispense: 118 mL; Refill: 0  -suspect viral URI start vs bronchitis -given MS- will have a low threshold for ABX if symptoms do not improve in 48 hours.  - Take meds as prescribed - Rest voice - Use a cool mist humidifier especially during the winter months when heat dries out the air. - Use saline nose sprays frequently to help soothe nasal passages if they are drying out. - Stay hydrated by drinking plenty of fluids - Keep thermostat turn down low to prevent drying out which can cause a dry cough. - For any cough or congestion- robitussin DM or Delsym as needed - For  fever or aches or pains- take tylenol or ibuprofen as directed on bottle             * for fevers greater than 101 orally you may alternate ibuprofen and tylenol every 3 hours.  If you do not improve you will need a follow up visit in person.                 Reviewed side effects, risks and benefits of medication.    Patient acknowledged agreement and understanding of the plan.   Past Medical, Surgical, Social History, Allergies, and Medications have been Reviewed.     Follow Up Instructions: I discussed the assessment and treatment plan with the patient. The patient was provided an opportunity to ask questions and all were answered. The patient agreed with the plan and demonstrated an understanding of the instructions.  A copy of instructions were sent to the patient via MyChart unless otherwise noted below.    The patient was advised to call back or seek an in-person evaluation if the symptoms worsen or if the condition fails to improve as anticipated.  Time:  I spent 10 minutes with the patient via telehealth  technology discussing the above problems/concerns.    Christy Mayo, NP

## 2022-05-02 ENCOUNTER — Telehealth: Payer: BLUE CROSS/BLUE SHIELD | Admitting: Physician Assistant

## 2022-05-02 DIAGNOSIS — J208 Acute bronchitis due to other specified organisms: Secondary | ICD-10-CM

## 2022-05-02 DIAGNOSIS — B9689 Other specified bacterial agents as the cause of diseases classified elsewhere: Secondary | ICD-10-CM

## 2022-05-02 MED ORDER — AZITHROMYCIN 250 MG PO TABS: ORAL_TABLET | ORAL | 0 refills | Status: AC

## 2022-05-02 MED ORDER — PREDNISONE 20 MG PO TABS: 40.0000 mg | ORAL_TABLET | Freq: Every day | ORAL | 0 refills | Status: DC

## 2022-05-02 NOTE — Patient Instructions (Signed)
Christy Cox, thank you for joining Mar Daring, PA-C for today's virtual visit.  While this provider is not your primary care provider (PCP), if your PCP is located in our provider database this encounter information will be shared with them immediately following your visit.   [facility] account gives you access to today's visit and all your visits, tests, and labs performed at [facility] " click here if you don't have a [facility] account or go to [email or web address]  Consent: (Patient) Christy Cox provided verbal consent for this virtual visit at the beginning of the encounter.  Current Medications:  Current Outpatient Medications:    azithromycin (ZITHROMAX) 250 MG tablet, Take 2 tablets on day 1, then 1 tablet daily on days 2 through 5, Disp: 6 tablet, Rfl: 0   predniSONE (DELTASONE) 20 MG tablet, Take 2 tablets (40 mg total) by mouth daily with breakfast., Disp: 10 tablet, Rfl: 0   Acetaminophen (TYLENOL 8 HOUR PO), Take 250 mg by mouth at bedtime., Disp: , Rfl:    albuterol (VENTOLIN HFA) 108 (90 Base) MCG/ACT inhaler, Inhale 2 puffs into the lungs every 6 (six) hours as needed for wheezing or shortness of breath. (Patient not taking: Reported on 10/13/2021), Disp: 8 g, Rfl: 0   budesonide-formoterol (SYMBICORT) 160-4.5 MCG/ACT inhaler, INHALE 2 PUFFS BY MOUTH INTO THE LUNGS DAILY (Patient not taking: Reported on 11/18/2021), Disp: 10.2 each, Rfl: 2   DULoxetine (CYMBALTA) 30 MG capsule, Take 30 mg by mouth., Disp: , Rfl:    fluticasone (FLONASE) 50 MCG/ACT nasal spray, Place 2 sprays into both nostrils daily., Disp: 16 g, Rfl: 0   methocarbamol (ROBAXIN) 500 MG tablet, Take 500 mg by mouth every 8 (eight) hours., Disp: , Rfl:    mirabegron ER (MYRBETRIQ) 25 MG TB24 tablet, Take 50 mg by mouth daily., Disp: , Rfl:    ondansetron (ZOFRAN-ODT) 4 MG disintegrating tablet, Take 4 mg by mouth every 8 (eight) hours as needed. (Patient not  taking: Reported on 11/18/2021), Disp: , Rfl:    promethazine-dextromethorphan (PROMETHAZINE-DM) 6.25-15 MG/5ML syrup, Take 5 mLs by mouth 4 (four) times daily as needed for cough., Disp: 118 mL, Rfl: 0   sulfamethoxazole-trimethoprim (BACTRIM DS) 800-160 MG tablet, Take 1 tablet by mouth daily. (Patient not taking: Reported on 02/22/2022), Disp: , Rfl:  No current facility-administered medications for this visit.  Facility-Administered Medications Ordered in Other Visits:    botulinum toxin Type A (BOTOX) injection 100 Units, 100 Units, Intramuscular, Once, Winter, Conception Oms, MD   Medications ordered in this encounter:  Meds ordered this encounter  Medications   azithromycin (ZITHROMAX) 250 MG tablet    Sig: Take 2 tablets on day 1, then 1 tablet daily on days 2 through 5    Dispense:  6 tablet    Refill:  0    Order Specific Question:   Supervising Provider    Answer:   Chase Picket [id number]   predniSONE (DELTASONE) 20 MG tablet    Sig: Take 2 tablets (40 mg total) by mouth daily with breakfast.    Dispense:  10 tablet    Refill:  0    Order Specific Question:   Supervising Provider    Answer:   Chase Picket [id number]     *If you need refills on other medications prior to your next appointment, please contact your pharmacy*  Follow-Up: Call back or seek an in-person evaluation if the symptoms worsen or if the condition  fails to improve as anticipated.  [place] [phone]  Other Instructions  Acute Bronchitis, Adult  Acute bronchitis is sudden inflammation of the main airways (bronchi) that come off the windpipe (trachea) in the lungs. The swelling causes the airways to get smaller and make more mucus than normal. This can make it hard to breathe and can cause coughing or noisy breathing (wheezing). Acute bronchitis may last several weeks. The cough may last longer. Allergies, asthma, and exposure to smoke may make the condition  worse. What are the causes? This condition can be caused by germs and by substances that irritate the lungs, including: Cold and flu viruses. The most common cause of this condition is the virus that causes the common cold. Bacteria. This is less common. Breathing in substances that irritate the lungs, including: Smoke from cigarettes and other forms of tobacco. Dust and pollen. Fumes from household cleaning products, gases, or burned fuel. Indoor or outdoor air pollution. What increases the risk? The following factors may make you more likely to develop this condition: A weak body's defense system, also called the immune system. A condition that affects your lungs and breathing, such as asthma. What are the signs or symptoms? Common symptoms of this condition include: Coughing. This may bring up clear, yellow, or green mucus from your lungs (sputum). Wheezing. Runny or stuffy nose. Having too much mucus in your lungs (chest congestion). Shortness of breath. Aches and pains, including sore throat or chest. How is this diagnosed? This condition is usually diagnosed based on: Your symptoms and medical history. A physical exam. You may also have other tests, including tests to rule out other conditions, such as pneumonia. These tests include: A test of lung function. Test of a mucus sample to look for the presence of bacteria. Tests to check the oxygen level in your blood. Blood tests. Chest X-ray. How is this treated? Most cases of acute bronchitis clear up over time without treatment. Your health care provider may recommend: Drinking more fluids to help thin your mucus so it is easier to cough up. Taking inhaled medicine (inhaler) to improve air flow in and out of your lungs. Using a vaporizer or a humidifier. These are machines that add water to the air to help you breathe better. Taking a medicine that thins mucus and clears congestion (expectorant). Taking a medicine that  prevents or stops coughing (cough suppressant). It is not common to take an antibiotic medicine for this condition. Follow these instructions at home:  Take over-the-counter and prescription medicines only as told by your health care provider. Use an inhaler, vaporizer, or humidifier as told by your health care provider. Take two teaspoons (10 mL) of honey at bedtime to lessen coughing at night. Drink enough fluid to keep your urine pale yellow. Do not use any products that contain nicotine or tobacco. These products include cigarettes, chewing tobacco, and vaping devices, such as e-cigarettes. If you need help quitting, ask your health care provider. Get plenty of rest. Return to your normal activities as told by your health care provider. Ask your health care provider what activities are safe for you. Keep all follow-up visits. This is important. How is this prevented? To lower your risk of getting this condition again: Wash your hands often with soap and water for at least 20 seconds. If soap and water are not available, use hand sanitizer. Avoid contact with people who have cold symptoms. Try not to touch your mouth, nose, or eyes  with your hands. Avoid breathing in smoke or chemical fumes. Breathing smoke or chemical fumes will make your condition worse. Get the flu shot every year. Contact a health care provider if: Your symptoms do not improve after 2 weeks. You have trouble coughing up the mucus. Your cough keeps you awake at night. You have a fever. Get help right away if you: Cough up blood. Feel pain in your chest. Have severe shortness of breath. Faint or keep feeling like you are going to faint. Have a severe headache. Have a fever or chills that get worse. These symptoms may represent a serious problem that is an emergency. Do not wait to see if the symptoms will go away. Get medical help right away. Call your local emergency services (911 in the [place] Do not drive  yourself to the hospital. Summary Acute bronchitis is inflammation of the main airways (bronchi) that come off the windpipe (trachea) in the lungs. The swelling causes the airways to get smaller and make more mucus than normal. Drinking more fluids can help thin your mucus so it is easier to cough up. Take over-the-counter and prescription medicines only as told by your health care provider. Do not use any products that contain nicotine or tobacco. These products include cigarettes, chewing tobacco, and vaping devices, such as e-cigarettes. If you need help quitting, ask your health care provider. Contact a health care provider if your symptoms do not improve after 2 weeks. This information is not intended to replace advice given to you by your health care provider. Make sure you discuss any questions you have with your health care provider. Document Revised: 05/27/2021 Document Reviewed: 06/17/2020 Elsevier Patient Education  [place].    If you have been instructed to have an in-person evaluation today at a local Urgent Care facility, please use the link below. It will take you to a list of all of our available [facility], including address, phone number and hours of operation. Please do not delay care.  [facility]  If you or a family member do not have a primary care provider, use the link below to schedule a visit and establish care. When you choose a [facility] primary care physician or advanced practice provider, you gain a long-term partner in health. Find a Primary Care Provider  Learn more about [facility]'s in-office and virtual care options: [facility]

## 2022-05-02 NOTE — Progress Notes (Signed)
Virtual Visit Consent   [name], you are scheduled for a virtual visit with a [facility] provider today. Just as with appointments in the office, your consent must be obtained to participate. Your consent will be active for this visit and any virtual visit you may have with one of our providers in the next 365 days. If you have a MyChart account, a copy of this consent can be sent to you electronically.  As this is a virtual visit, video technology does not allow for your provider to perform a traditional examination. This may limit your provider's ability to fully assess your condition. If your provider identifies any concerns that need to be evaluated in person or the need to arrange testing (such as labs, EKG, etc.), we will make arrangements to do so. Although advances in technology are sophisticated, we cannot ensure that it will always work on either your end or our end. If the connection with a video visit is poor, the visit may have to be switched to a telephone visit. With either a video or telephone visit, we are not always able to ensure that we have a secure connection.  By engaging in this virtual visit, you consent to the provision of healthcare and authorize for your insurance to be billed (if applicable) for the services provided during this visit. Depending on your insurance coverage, you may receive a charge related to this service.  I need to obtain your verbal consent now. Are you willing to proceed with your visit today? Christy Cox has provided verbal consent on 05/02/2022 for a virtual visit (video or telephone). Mar Daring, PA-C  Date: 05/02/2022 7:48 PM  Virtual Visit via Video Note   I, Mar Daring, connected with  Christy Cox  ([id number], 1975/11/14) on 05/02/22 at  7:45 PM EST by a video-enabled telemedicine application and verified that I am speaking with the correct person using two identifiers.  Location: Patient: Virtual Visit Location  Patient: Home Provider: Virtual Visit Location Provider: Home Office   I discussed the limitations of evaluation and management by telemedicine and the availability of in person appointments. The patient expressed understanding and agreed to proceed.    History of Present Illness: Christy Cox is a 47 y.o. who identifies as a female who was assigned female at birth, and is being seen today for cough.  HPI: Cough This is a new problem. The current episode started 1 to 4 weeks ago (Seen virtually on 04/27/22). The problem has been gradually worsening. The problem occurs constantly. Associated symptoms include chest pain (tight and sore from coughing), headaches, nasal congestion, a sore throat (from coughing), shortness of breath (with exertion) and wheezing. Pertinent negatives include no chills, ear congestion, ear pain, fever, myalgias, postnasal drip or rhinorrhea. The symptoms are aggravated by lying down. Treatments tried: dayquil, nyquil, inhalers, flonase, symbicort. The treatment provided no relief. Her past medical history is significant for asthma.     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   Overactive bladder 05/24/2013   Incomplete bladder emptying 05/24/2013   Urinary retention 05/24/2013   Multiple sclerosis ([place]) 05/31/2012   DS (disseminated sclerosis) ([place]) 05/31/2012    Allergies:  Allergies  Allergen Reactions   Macrodantin [Nitrofurantoin Macrocrystal] Other (See Comments)    Caused  elevated liver function and jaundice   Medications:  Current Outpatient Medications:    azithromycin (ZITHROMAX) 250 MG tablet, Take 2 tablets on day 1, then 1 tablet daily on days 2 through 5, Disp: 6 tablet, Rfl: 0   predniSONE (DELTASONE) 20 MG tablet, Take 2 tablets (40 mg total) by  mouth daily with breakfast., Disp: 10 tablet, Rfl: 0   Acetaminophen (TYLENOL 8 HOUR PO), Take 250 mg by mouth at bedtime., Disp: , Rfl:    albuterol (VENTOLIN HFA) 108 (90 Base) MCG/ACT inhaler, Inhale 2 puffs into the lungs every 6 (six) hours as needed for wheezing or shortness of breath. (Patient not taking: Reported on 10/13/2021), Disp: 8 g, Rfl: 0   budesonide-formoterol (SYMBICORT) 160-4.5 MCG/ACT inhaler, INHALE 2 PUFFS BY MOUTH INTO THE LUNGS DAILY (Patient not taking: Reported on 11/18/2021), Disp: 10.2 each, Rfl: 2   DULoxetine (CYMBALTA) 30 MG capsule, Take 30 mg by mouth., Disp: , Rfl:    fluticasone (FLONASE) 50 MCG/ACT nasal spray, Place 2 sprays into both nostrils daily., Disp: 16 g, Rfl: 0   methocarbamol (ROBAXIN) 500 MG tablet, Take 500 mg by mouth every 8 (eight) hours., Disp: , Rfl:    mirabegron ER (MYRBETRIQ) 25 MG TB24 tablet, Take 50 mg by mouth daily., Disp: , Rfl:    ondansetron (ZOFRAN-ODT) 4 MG disintegrating tablet, Take 4 mg by mouth every 8 (eight) hours as needed. (Patient not taking: Reported on 11/18/2021), Disp: , Rfl:    promethazine-dextromethorphan (PROMETHAZINE-DM) 6.25-15 MG/5ML syrup, Take 5 mLs by mouth 4 (four) times daily as needed for cough., Disp: 118 mL, Rfl: 0   sulfamethoxazole-trimethoprim (BACTRIM DS) 800-160 MG tablet, Take 1 tablet by mouth daily. (Patient not taking: Reported on 02/22/2022), Disp: , Rfl:  No current facility-administered medications for this visit.  Facility-Administered Medications Ordered in Other Visits:    botulinum toxin Type A (BOTOX) injection 100 Units, 100 Units, Intramuscular, Once, Winter, Conception Oms, MD  Observations/Objective: Patient is well-developed, well-nourished in no acute distress.  Resting comfortably  at home.  Head is normocephalic, atraumatic.  No labored breathing.  Speech is clear and coherent with logical content.  Patient is alert and oriented at baseline.    Assessment and Plan: 1.  Acute bacterial bronchitis - azithromycin (ZITHROMAX) 250 MG tablet; Take 2 tablets on day 1, then 1 tablet daily on days 2 through 5  Dispense: 6 tablet; Refill: 0 - predniSONE (DELTASONE) 20 MG tablet; Take 2 tablets (40 mg total) by mouth daily with breakfast.  Dispense: 10 tablet; Refill: 0  - Worsening over a week despite OTC medications - Will treat with Z-pack and Prednisone - Can continue Mucinex  - Push fluids.  - Rest.  - Steam and humidifier can help - Seek in person evaluation if worsening or symptoms fail to improve    Follow Up Instructions: I discussed the assessment and treatment plan with the patient. The patient was provided an opportunity to ask questions and all were answered. The patient agreed with the plan and demonstrated an understanding of the instructions.  A copy of instructions were sent to the patient via MyChart unless otherwise noted below.    The patient was advised to call back or seek an in-person evaluation if the symptoms worsen or if the condition fails to improve as anticipated.  Time:  I spent 8 minutes with the patient via telehealth technology discussing the above problems/concerns.    Mar Daring, PA-C

## 2022-05-05 ENCOUNTER — Ambulatory Visit: Payer: BLUE CROSS/BLUE SHIELD | Admitting: Nurse Practitioner

## 2022-05-20 DIAGNOSIS — R339 Retention of urine, unspecified: Secondary | ICD-10-CM | POA: Diagnosis not present

## 2022-06-08 ENCOUNTER — Encounter: Payer: Self-pay | Admitting: Nurse Practitioner

## 2022-06-17 DIAGNOSIS — M1712 Unilateral primary osteoarthritis, left knee: Secondary | ICD-10-CM | POA: Diagnosis not present

## 2022-06-20 DIAGNOSIS — G35 Multiple sclerosis: Secondary | ICD-10-CM | POA: Diagnosis not present

## 2022-06-28 DIAGNOSIS — M170 Bilateral primary osteoarthritis of knee: Secondary | ICD-10-CM | POA: Diagnosis not present

## 2022-07-11 DIAGNOSIS — R399 Unspecified symptoms and signs involving the genitourinary system: Secondary | ICD-10-CM | POA: Diagnosis not present

## 2022-07-14 DIAGNOSIS — R350 Frequency of micturition: Secondary | ICD-10-CM | POA: Diagnosis not present

## 2022-07-14 DIAGNOSIS — N3941 Urge incontinence: Secondary | ICD-10-CM | POA: Diagnosis not present

## 2022-07-18 DIAGNOSIS — G35 Multiple sclerosis: Secondary | ICD-10-CM | POA: Diagnosis not present

## 2022-08-16 DIAGNOSIS — G35 Multiple sclerosis: Secondary | ICD-10-CM | POA: Diagnosis not present

## 2022-09-12 DIAGNOSIS — G35 Multiple sclerosis: Secondary | ICD-10-CM | POA: Diagnosis not present

## 2022-09-30 DIAGNOSIS — R339 Retention of urine, unspecified: Secondary | ICD-10-CM | POA: Diagnosis not present

## 2022-10-03 ENCOUNTER — Encounter: Payer: BLUE CROSS/BLUE SHIELD | Admitting: Nurse Practitioner

## 2022-10-06 ENCOUNTER — Ambulatory Visit: Payer: BLUE CROSS/BLUE SHIELD | Admitting: Physician Assistant

## 2022-10-06 ENCOUNTER — Encounter: Payer: Self-pay | Admitting: Physician Assistant

## 2022-10-06 VITALS — BP 83/59 | HR 64 | Ht 67.0 in | Wt 186.8 lb

## 2022-10-06 DIAGNOSIS — Z7189 Other specified counseling: Secondary | ICD-10-CM

## 2022-10-06 DIAGNOSIS — Z0000 Encounter for general adult medical examination without abnormal findings: Secondary | ICD-10-CM | POA: Diagnosis not present

## 2022-10-06 LAB — URINALYSIS, ROUTINE W REFLEX MICROSCOPIC
Bilirubin, UA: NEGATIVE
Glucose, UA: NEGATIVE
Ketones, UA: NEGATIVE
Leukocytes,UA: NEGATIVE
Nitrite, UA: NEGATIVE
Protein,UA: NEGATIVE
RBC, UA: NEGATIVE
Specific Gravity, UA: 1.02 (ref 1.005–1.030)
Urobilinogen, Ur: 0.2 mg/dL (ref 0.2–1.0)
pH, UA: 8.5 — ABNORMAL HIGH (ref 5.0–7.5)

## 2022-10-06 NOTE — Progress Notes (Signed)
Annual Physical Exam   Name: Christy Cox   MRN: [id number]    DOB: 01/19/1976   Date:10/06/2022  Today's Provider: Jacquelin Hawking, MHS, PA-C Introduced myself to the patient as a PA-C and provided education on APPs in clinical practice.         Subjective  Chief Complaint  Chief Complaint  Patient presents with   Annual Exam    HPI  Patient presents for annual CPE.  Diet: She has had gastric sleeve surgery 2 years ago and tries to restrict to 1000 calories per day  Exercise: She is not engaged in regular exercise   Sleep: Reports she works third shift so she is very fatigued a lot of the time. Getting about 4 hours per night.  Mood: "it's fine"    Depression: Phq 9 is  negative    10/06/2022    9:24 AM 09/29/2021   10:13 AM 08/26/2021    1:18 PM 01/27/2020   11:04 AM 11/21/2018    9:19 AM  Depression screen PHQ 2/9  Decreased Interest 0 0 0 0 0  Down, Depressed, Hopeless 0 0 0 0 0  PHQ - 2 Score 0 0 0 0 0  Altered sleeping 0 0 0 0 0  Tired, decreased energy 0 0 3 3 1   Change in appetite 0 0 0 0 0  Feeling bad or failure about yourself  0 0 0 0 0  Trouble concentrating 0 0 0 0 0  Moving slowly or fidgety/restless 0 0 0 0 0  Suicidal thoughts 0 0 0 0 0  PHQ-9 Score 0 0 3 3 1   Difficult doing work/chores Not difficult at all Not difficult at all Not difficult at all Not difficult at all    Hypertension: BP Readings from Last 3 Encounters:  10/06/22 (!) 83/59  10/13/21 103/65  09/29/21 90/63   Obesity: Wt Readings from Last 3 Encounters:  10/06/22 186 lb 12.8 oz (84.7 kg)  10/13/21 183 lb 4.8 oz (83.1 kg)  09/29/21 185 lb (83.9 kg)   BMI Readings from Last 3 Encounters:  10/06/22 29.26 kg/m  10/13/21 29.59 kg/m  09/29/21 29.21 kg/m     Vaccines:   HPV: aged out  Tdap: reviewed need with patient, discussed importance of vaccination. She declines this today  Shingrix: NA Pneumonia: NA Flu: postponed until fall  COVID-19: Discussed vaccine and  booster recommendations per available CDC guidelines     Hep C Screening: ordered today  HIV screening: completed  STD testing and prevention (HIV/chl/gon/syphilis):  declines today  Intimate partner violence: negative Sexual History : She is not sexually active  Menstrual History/LMP/Abnormal Bleeding: last one was 7 years ago  Discussed importance of follow up if any post-menopausal bleeding: yes Incontinence Symptoms: Yes.  She does report some bladder incontinence. She self-caths when urinating. She sees Urology for this   Breast cancer hx:  - Last Mammogram: She has these ordered through Ob/Gyn  - BRCA gene screening:   Osteoporosis Prevention : Discussed high calcium and vitamin D supplementation, weight bearing exercises Bone density :not applicable  Cervical cancer screening: has been completed- she sees OB/GYN  Skin cancer: Discussed monitoring for atypical lesions  Colorectal cancer screening: discussed importance of screening  Lung cancer:  Low Dose CT Chest recommended if Age 32-80 years, 20 pack-year currently smoking OR have quit w/in 15years. Patient does not qualify.   ECG: NA  Advanced Care Planning: A voluntary discussion about advance  care planning including the explanation and discussion of advance directives.  Discussed health care proxy and Living will, and the patient was able to identify a health care proxy as no one.  Patient does not have a living will in effect.  Lipids: Lab Results  Component Value Date   CHOL 147 09/29/2021   CHOL 156 12/25/2019   Lab Results  Component Value Date   HDL 54 09/29/2021   HDL 45 12/25/2019   Lab Results  Component Value Date   LDLCALC 78 09/29/2021   LDLCALC 91 12/25/2019   Lab Results  Component Value Date   TRIG 74 09/29/2021   TRIG 112 12/25/2019   Lab Results  Component Value Date   CHOLHDL 2.7 09/29/2021   No results found for: "LDLDIRECT"  Glucose: Glucose  Date Value Ref Range Status   09/29/2021 81 70 - 99 mg/dL Final  78/46/9629 70 65 - 99 mg/dL Final  52/84/1324 78 65 - 99 mg/dL Final    Patient Active Problem List   Diagnosis Date Noted   Advanced care planning/counseling discussion 10/06/2022   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   Recurrent UTI 02/03/2018   Strain of trapezius muscle 12/06/2017   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   Overactive bladder 05/24/2013   Incomplete bladder emptying 05/24/2013   Urinary retention 05/24/2013   Multiple sclerosis (HCC) 05/31/2012   DS (disseminated sclerosis) (HCC) 05/31/2012    Past Surgical History:  Procedure Laterality Date   CESAREAN SECTION  1999; 2003   BILATERAL TUBAL LIGATION W/ LAST C/S   CYSTO/  BOTOX INJECTION  multiple-- last one 2018 @ [facility]   CYSTOSCOPY WITH INJECTION N/A 09/26/2017   Procedure: CYSTOSCOPY WITH INJECTION/ BOTOX;  Surgeon: Rene Paci, MD;  Location: [facility];  Service: Urology;  Laterality: N/A;   HERNIA REPAIR  age 34 and age 71    Family History  Problem Relation Age of Onset   Breast cancer Mother 35   Heart attack Father 24       x2 at 65 them at 5, now s/p CABG   Kidney disease Father    Cancer Other    Coronary artery disease Other    Diabetes Other    Cancer Paternal Grandmother     Social History   Socioeconomic History   Marital status: Divorced    Spouse name: dennis   Number of children: 2   Years of education: College   Highest education level: Not on file  Occupational History   Occupation: n/a    Comment: disabled  Tobacco Use   Smoking status: Never   Smokeless tobacco: Never  Vaping Use   Vaping status: Never Used  Substance and Sexual Activity   Alcohol use: Not Currently   Drug use: No   Sexual activity: Not Currently    Birth control/protection: Surgical  Other Topics Concern   Not on file  Social History Narrative   Pt lives at home with her spouse.    Caffeine Use- 1-2  per week   Social Determinants of Health   Financial Resource Strain: Low Risk  (05/05/2017)   Overall Financial Resource Strain (CARDIA)    Difficulty of Paying Living Expenses: Not hard at all  Food Insecurity: No Food Insecurity (05/05/2017)   Hunger Vital Sign    Worried About Running Out of Food in the Last Year: Never true    Ran Out of Food in the Last Year: Never true  Transportation  Needs: No Transportation Needs (05/05/2017)   PRAPARE - Administrator, Civil Service (Medical): No    Lack of Transportation (Non-Medical): No  Physical Activity: Inactive (05/05/2017)   Exercise Vital Sign    Days of Exercise per Week: 0 days    Minutes of Exercise per Session: 0 min  Stress: No Stress Concern Present (05/05/2017)   [employer] of Occupational Health - Occupational Stress Questionnaire    Feeling of Stress : Not at all  Social Connections: Somewhat Isolated (05/05/2017)   Social Connection and Isolation Panel [NHANES]    Frequency of Communication with Friends and Family: More than three times a week    Frequency of Social Gatherings with Friends and Family: More than three times a week    Attends Religious Services: More than 4 times per year    Active Member of [employer] or Organizations: No    Attends Banker Meetings: Never    Marital Status: Separated  Intimate Partner Violence: Not At Risk (05/05/2017)   Humiliation, Afraid, Rape, and Kick questionnaire    Fear of Current or Ex-Partner: No    Emotionally Abused: No    Physically Abused: No    Sexually Abused: No     Current Outpatient Medications:    mirabegron ER (MYRBETRIQ) 25 MG TB24 tablet, Take 50 mg by mouth daily., Disp: , Rfl:    sulfamethoxazole-trimethoprim (BACTRIM DS) 800-160 MG tablet, Take 1 tablet by mouth daily., Disp: , Rfl:    Acetaminophen (TYLENOL 8 HOUR PO), Take 250 mg by mouth at bedtime. (Patient not taking: Reported on 10/06/2022), Disp: , Rfl:    albuterol (VENTOLIN HFA) 108  (90 Base) MCG/ACT inhaler, Inhale 2 puffs into the lungs every 6 (six) hours as needed for wheezing or shortness of breath. (Patient not taking: Reported on 10/13/2021), Disp: 8 g, Rfl: 0   budesonide-formoterol (SYMBICORT) 160-4.5 MCG/ACT inhaler, INHALE 2 PUFFS BY MOUTH INTO THE LUNGS DAILY (Patient not taking: Reported on 11/18/2021), Disp: 10.2 each, Rfl: 2   DULoxetine (CYMBALTA) 30 MG capsule, Take 30 mg by mouth., Disp: , Rfl:    fluticasone (FLONASE) 50 MCG/ACT nasal spray, Place 2 sprays into both nostrils daily. (Patient not taking: Reported on 10/06/2022), Disp: 16 g, Rfl: 0   methocarbamol (ROBAXIN) 500 MG tablet, Take 500 mg by mouth every 8 (eight) hours. (Patient not taking: Reported on 10/06/2022), Disp: , Rfl:    ondansetron (ZOFRAN-ODT) 4 MG disintegrating tablet, Take 4 mg by mouth every 8 (eight) hours as needed. (Patient not taking: Reported on 11/18/2021), Disp: , Rfl:    predniSONE (DELTASONE) 20 MG tablet, Take 2 tablets (40 mg total) by mouth daily with breakfast. (Patient not taking: Reported on 10/06/2022), Disp: 10 tablet, Rfl: 0   promethazine-dextromethorphan (PROMETHAZINE-DM) 6.25-15 MG/5ML syrup, Take 5 mLs by mouth 4 (four) times daily as needed for cough. (Patient not taking: Reported on 10/06/2022), Disp: 118 mL, Rfl: 0 No current facility-administered medications for this visit.  Facility-Administered Medications Ordered in Other Visits:    botulinum toxin Type A (BOTOX) injection 100 Units, 100 Units, Intramuscular, Once, Winter, Dorian Furnace, MD  Allergies  Allergen Reactions   Macrodantin [Nitrofurantoin Macrocrystal] Other (See Comments)    Caused elevated liver function and jaundice     Review of Systems  Constitutional:  Positive for malaise/fatigue. Negative for chills, fever and weight loss.  HENT:  Negative for hearing loss, nosebleeds, sore throat and tinnitus.   Eyes:  Negative for blurred vision, double  vision and photophobia.  Respiratory:  Negative  for shortness of breath and wheezing.   Cardiovascular:  Negative for chest pain, palpitations and leg swelling.  Gastrointestinal:  Negative for blood in stool, constipation, diarrhea, heartburn, nausea and vomiting.  Musculoskeletal:  Positive for joint pain (in knees) and myalgias (leg aches and cramping). Negative for falls.  Skin:  Negative for itching and rash.  Neurological:  Negative for dizziness, tingling, tremors, loss of consciousness, weakness and headaches.  Psychiatric/Behavioral:  Negative for depression, substance abuse and suicidal ideas. The patient is not nervous/anxious.       Objective  Vitals:   10/06/22 0919  BP: (!) 83/59  Pulse: 64  SpO2: 96%  Weight: 186 lb 12.8 oz (84.7 kg)  Height: 5\' 7"  (1.702 m)    Body mass index is 29.26 kg/m.  Physical Exam Vitals reviewed.  Constitutional:      General: She is awake.     Appearance: Normal appearance. She is well-developed and well-groomed.  HENT:     Head: Normocephalic and atraumatic.     Right Ear: Hearing, tympanic membrane and ear canal normal.     Left Ear: Hearing, tympanic membrane and ear canal normal.     Mouth/Throat:     Lips: Pink.     Mouth: Mucous membranes are moist.     Pharynx: Oropharynx is clear. Uvula midline. No pharyngeal swelling, oropharyngeal exudate or posterior oropharyngeal erythema.     Tonsils: No tonsillar exudate or tonsillar abscesses.  Eyes:     General: Lids are normal. Gaze aligned appropriately.     Extraocular Movements: Extraocular movements intact.     Conjunctiva/sclera: Conjunctivae normal.     Pupils: Pupils are equal, round, and reactive to light.  Neck:     Thyroid: No thyroid mass, thyromegaly or thyroid tenderness.  Cardiovascular:     Rate and Rhythm: Normal rate and regular rhythm.     Pulses: Normal pulses.          Radial pulses are 2+ on the right side and 2+ on the left side.     Heart sounds: Normal heart sounds. No murmur heard.    No friction  rub. No gallop.  Pulmonary:     Effort: Pulmonary effort is normal.     Breath sounds: Normal breath sounds. No decreased air movement. No decreased breath sounds, wheezing or rales.  Abdominal:     General: Abdomen is flat. Bowel sounds are normal.     Palpations: Abdomen is soft.     Tenderness: There is no abdominal tenderness.  Musculoskeletal:     Cervical back: Normal range of motion and neck supple.     Right lower leg: No edema.     Left lower leg: No edema.  Lymphadenopathy:     Head:     Right side of head: No submental, submandibular or preauricular adenopathy.     Left side of head: No submental, submandibular or preauricular adenopathy.     Cervical:     Right cervical: No superficial or posterior cervical adenopathy.    Left cervical: No superficial or posterior cervical adenopathy.     Upper Body:     Right upper body: No supraclavicular adenopathy.     Left upper body: No supraclavicular adenopathy.  Skin:    General: Skin is warm and dry.  Neurological:     General: No focal deficit present.     Mental Status: She is alert and oriented to person, place, and time.  GCS: GCS eye subscore is 4. GCS verbal subscore is 5. GCS motor subscore is 6.     Cranial Nerves: No cranial nerve deficit, dysarthria or facial asymmetry.     Motor: No weakness, tremor or atrophy.     Gait: Gait is intact.  Psychiatric:        Attention and Perception: Attention and perception normal.        Mood and Affect: Mood and affect normal.        Speech: Speech normal.        Behavior: Behavior normal. Behavior is cooperative.        Thought Content: Thought content normal.        Cognition and Memory: Cognition normal.      No results found for this or any previous visit (from the past 2160 hour(s)).   Fall Risk:    10/06/2022    9:25 AM 09/29/2021   10:12 AM 08/26/2021    1:18 PM 11/21/2018    9:19 AM 05/05/2017   10:37 AM  Fall Risk   Falls in the past year? 0 1 0 0 Yes   Number falls in past yr: 0 1 0  1  Injury with Fall? 0 0 0  No  Risk for fall due to : No Fall Risks History of fall(s) No Fall Risks    Follow up Falls evaluation completed Falls evaluation completed Falls evaluation completed Falls evaluation completed Falls prevention discussed     Functional Status Survey:     Assessment & Plan  Problem List Items Addressed This Visit       Other   Advanced care planning/counseling discussion    A voluntary discussion about advance care planning including the explanation and discussion of advance directives was extensively discussed  with the patient for 5 minutes with patient and myself  present.  Explanation about the health care proxy and Living will was reviewed and packet with forms with explanation of how to fill them out was given.  During this discussion, the patient was not able to identify a health care proxy and plans to fill out the paperwork required.  Patient was offered a separate Advance Care Planning visit for further assistance with forms.         Other Visit Diagnoses     Annual physical exam    -  Primary -USPSTF grade A and B recommendations reviewed with patient; age-appropriate recommendations, preventive care, screening tests, etc discussed and encouraged; healthy living encouraged; see AVS for patient education given to patient -Discussed importance of 150 minutes of physical activity weekly, eat two servings of fish weekly, eat one serving of tree nuts ( cashews, pistachios, pecans, almonds.Marland Kitchen) every other day, eat 6 servings of fruit/vegetables daily and drink plenty of water and avoid sweet beverages.   -Reviewed Health Maintenance: Yes.     Relevant Orders   CBC w/Diff   Comp Met (CMET)   Lipid Profile   HgB A1c   Urinalysis, Routine w reflex microscopic   TSH        Return in about 1 year (around 10/06/2023) for annual physical and fasting labs.   I,  E , PA-C, have reviewed all documentation for  this visit. The documentation on 10/06/22 for the exam, diagnosis, procedures, and orders are all accurate and complete.   Jacquelin Hawking, MHS, PA-C [facility]

## 2022-10-06 NOTE — Assessment & Plan Note (Signed)

## 2022-10-07 NOTE — Progress Notes (Signed)
Labs are normal/stable.

## 2022-10-24 DIAGNOSIS — M791 Myalgia, unspecified site: Secondary | ICD-10-CM | POA: Diagnosis not present

## 2022-10-24 DIAGNOSIS — G35 Multiple sclerosis: Secondary | ICD-10-CM | POA: Diagnosis not present

## 2022-11-09 ENCOUNTER — Telehealth: Payer: Self-pay | Admitting: Nurse Practitioner

## 2022-11-09 NOTE — Telephone Encounter (Signed)
Copied from CRM [id number]. Topic: General - Other >> Nov 08, 2022  3:33 PM Phill Myron wrote: [name] would like to have Dr Laural Benes as her provider. Please advise

## 2022-11-17 DIAGNOSIS — N3941 Urge incontinence: Secondary | ICD-10-CM | POA: Diagnosis not present

## 2022-11-17 DIAGNOSIS — R350 Frequency of micturition: Secondary | ICD-10-CM | POA: Diagnosis not present

## 2022-11-29 DIAGNOSIS — R339 Retention of urine, unspecified: Secondary | ICD-10-CM | POA: Diagnosis not present

## 2022-12-05 DIAGNOSIS — G35 Multiple sclerosis: Secondary | ICD-10-CM | POA: Diagnosis not present

## 2022-12-12 ENCOUNTER — Encounter: Payer: Self-pay | Admitting: Nurse Practitioner

## 2022-12-13 DIAGNOSIS — Z0100 Encounter for examination of eyes and vision without abnormal findings: Secondary | ICD-10-CM | POA: Diagnosis not present

## 2022-12-13 DIAGNOSIS — H469 Unspecified optic neuritis: Secondary | ICD-10-CM | POA: Diagnosis not present

## 2022-12-26 DIAGNOSIS — M25562 Pain in left knee: Secondary | ICD-10-CM | POA: Diagnosis not present

## 2022-12-26 DIAGNOSIS — R2242 Localized swelling, mass and lump, left lower limb: Secondary | ICD-10-CM | POA: Diagnosis not present

## 2022-12-26 DIAGNOSIS — M13862 Other specified arthritis, left knee: Secondary | ICD-10-CM | POA: Diagnosis not present

## 2022-12-27 ENCOUNTER — Other Ambulatory Visit: Payer: Self-pay | Admitting: Physician Assistant

## 2022-12-27 DIAGNOSIS — R2242 Localized swelling, mass and lump, left lower limb: Secondary | ICD-10-CM

## 2022-12-29 ENCOUNTER — Ambulatory Visit
Admission: RE | Admit: 2022-12-29 | Discharge: 2022-12-29 | Disposition: A | Discharge status: Home / Self Care | Payer: BLUE CROSS/BLUE SHIELD | Source: Ambulatory Visit | Attending: Physician Assistant | Admitting: Physician Assistant

## 2022-12-29 DIAGNOSIS — M7989 Other specified soft tissue disorders: Secondary | ICD-10-CM | POA: Diagnosis not present

## 2022-12-29 DIAGNOSIS — R2242 Localized swelling, mass and lump, left lower limb: Secondary | ICD-10-CM | POA: Insufficient documentation

## 2023-01-04 DIAGNOSIS — G35 Multiple sclerosis: Secondary | ICD-10-CM | POA: Diagnosis not present

## 2023-01-06 DIAGNOSIS — M1712 Unilateral primary osteoarthritis, left knee: Secondary | ICD-10-CM | POA: Diagnosis not present

## 2023-01-06 DIAGNOSIS — M2242 Chondromalacia patellae, left knee: Secondary | ICD-10-CM | POA: Diagnosis not present

## 2023-01-20 DIAGNOSIS — R399 Unspecified symptoms and signs involving the genitourinary system: Secondary | ICD-10-CM | POA: Diagnosis not present

## 2023-01-24 DIAGNOSIS — R339 Retention of urine, unspecified: Secondary | ICD-10-CM | POA: Diagnosis not present

## 2023-02-04 DIAGNOSIS — G35 Multiple sclerosis: Secondary | ICD-10-CM | POA: Diagnosis not present

## 2023-02-17 DIAGNOSIS — R339 Retention of urine, unspecified: Secondary | ICD-10-CM | POA: Diagnosis not present

## 2023-03-06 DIAGNOSIS — M1712 Unilateral primary osteoarthritis, left knee: Secondary | ICD-10-CM | POA: Diagnosis not present

## 2023-03-06 DIAGNOSIS — M2242 Chondromalacia patellae, left knee: Secondary | ICD-10-CM | POA: Diagnosis not present

## 2023-03-14 DIAGNOSIS — M13862 Other specified arthritis, left knee: Secondary | ICD-10-CM | POA: Diagnosis not present

## 2023-03-21 ENCOUNTER — Encounter: Payer: Self-pay | Admitting: Nurse Practitioner

## 2023-03-21 ENCOUNTER — Telehealth: Payer: BLUE CROSS/BLUE SHIELD | Admitting: Family Medicine

## 2023-03-22 ENCOUNTER — Ambulatory Visit (INDEPENDENT_AMBULATORY_CARE_PROVIDER_SITE_OTHER): Payer: BLUE CROSS/BLUE SHIELD | Admitting: Nurse Practitioner

## 2023-03-22 VITALS — BP 92/63 | HR 75 | Temp 97.8°F | Ht 67.0 in | Wt 200.8 lb

## 2023-03-22 DIAGNOSIS — M13862 Other specified arthritis, left knee: Secondary | ICD-10-CM | POA: Diagnosis not present

## 2023-03-22 DIAGNOSIS — G35 Multiple sclerosis: Secondary | ICD-10-CM | POA: Diagnosis not present

## 2023-03-22 DIAGNOSIS — Z0289 Encounter for other administrative examinations: Secondary | ICD-10-CM | POA: Diagnosis not present

## 2023-03-22 NOTE — Assessment & Plan Note (Signed)
Chronic. Needs ADA paperwork completed for employer.  She is followed by Rheumatology and had her transplant at [facility]. Doing well with no concerns at visit today.  Paperwork completed for patient during visit.

## 2023-03-22 NOTE — Progress Notes (Signed)
BP 92/63 (BP Location: Left Arm, Patient Position: Sitting, Cuff Size: Large)   Pulse 75   Temp 97.8 F (36.6 C) (Oral)   Ht 5\' 7"  (1.702 m)   Wt 200 lb 12.8 oz (91.1 kg)   LMP 07/30/2015 (Approximate)   SpO2 97%   BMI 31.45 kg/m    Subjective:    Patient ID: Christy Cox, female    DOB: September 02, 1975, 48 y.o.   MRN: [id number]  HPI: Christy Cox is a 48 y.o. female  Chief Complaint  Patient presents with   documents   Patient presents to clinic to have paperwork completed.  Patient has multiple sclerosis and had a transplant 7 years ago.  She has had ADA paperwork in the past.  She works 20 hours per week with 4 hours of PTO to use during the week.     Relevant past medical, surgical, family and social history reviewed and updated as indicated. Interim medical history since our last visit reviewed. Allergies and medications reviewed and updated.  Review of Systems  Constitutional:  Positive for fatigue.    Per HPI unless specifically indicated above     Objective:    BP 92/63 (BP Location: Left Arm, Patient Position: Sitting, Cuff Size: Large)   Pulse 75   Temp 97.8 F (36.6 C) (Oral)   Ht 5\' 7"  (1.702 m)   Wt 200 lb 12.8 oz (91.1 kg)   LMP 07/30/2015 (Approximate)   SpO2 97%   BMI 31.45 kg/m   Wt Readings from Last 3 Encounters:  03/22/23 200 lb 12.8 oz (91.1 kg)  10/06/22 186 lb 12.8 oz (84.7 kg)  10/13/21 183 lb 4.8 oz (83.1 kg)    Physical Exam Vitals and nursing note reviewed.  Constitutional:      General: She is not in acute distress.    Appearance: Normal appearance. She is not ill-appearing, toxic-appearing or diaphoretic.  HENT:     Head: Normocephalic.     Right Ear: External ear normal.     Left Ear: External ear normal.     Nose: Nose normal.     Mouth/Throat:     Mouth: Mucous membranes are moist.     Pharynx: Oropharynx is clear.  Eyes:     General:        Right eye: No discharge.        Left eye: No discharge.     Extraocular  Movements: Extraocular movements intact.     Conjunctiva/sclera: Conjunctivae normal.     Pupils: Pupils are equal, round, and reactive to light.  Cardiovascular:     Rate and Rhythm: Normal rate and regular rhythm.     Heart sounds: No murmur heard. Pulmonary:     Effort: Pulmonary effort is normal. No respiratory distress.     Breath sounds: Normal breath sounds. No wheezing or rales.  Musculoskeletal:     Cervical back: Normal range of motion and neck supple.  Skin:    General: Skin is warm and dry.     Capillary Refill: Capillary refill takes less than 2 seconds.  Neurological:     General: No focal deficit present.     Mental Status: She is alert and oriented to person, place, and time. Mental status is at baseline.  Psychiatric:        Mood and Affect: Mood normal.        Behavior: Behavior normal.        Thought Content: Thought content normal.  Judgment: Judgment normal.     Results for orders placed or performed in visit on 03/21/23  HM PAP SMEAR   Collection Time: 11/25/21 12:00 AM  Result Value Ref Range   HM Pap smear see report scanned into chart       Assessment & Plan:   Problem List Items Addressed This Visit       Nervous and Auditory   Multiple sclerosis (HCC) - Primary   Chronic. Needs ADA paperwork completed for employer.  She is followed by Rheumatology and had her transplant at [facility]. Doing well with no concerns at visit today.  Paperwork completed for patient during visit.      DS (disseminated sclerosis) (HCC)   Other Visit Diagnoses       Encounter for completion of form with patient            Follow up plan: No follow-ups on file.

## 2023-03-24 DIAGNOSIS — R339 Retention of urine, unspecified: Secondary | ICD-10-CM | POA: Diagnosis not present

## 2023-04-17 DIAGNOSIS — M1712 Unilateral primary osteoarthritis, left knee: Secondary | ICD-10-CM | POA: Diagnosis not present

## 2023-04-17 DIAGNOSIS — M25562 Pain in left knee: Secondary | ICD-10-CM | POA: Diagnosis not present

## 2023-04-20 ENCOUNTER — Telehealth: Payer: 59 | Admitting: Physician Assistant

## 2023-04-20 DIAGNOSIS — J069 Acute upper respiratory infection, unspecified: Secondary | ICD-10-CM

## 2023-04-20 MED ORDER — PREDNISONE 10 MG (21) PO TBPK
ORAL_TABLET | ORAL | 0 refills | Status: DC
Start: 2023-04-20 — End: 2023-05-30

## 2023-04-20 NOTE — Patient Instructions (Signed)
Christy Cox, thank you for joining [name], PA-C for today's virtual visit.  While this provider is not your primary care provider (PCP), if your PCP is located in our provider database this encounter information will be shared with them immediately following your visit.   A [place] MyChart account gives you access to today's visit and all your visits, tests, and labs performed at [facility] " click here if you don't have a [facility] MyChart account or go to [email or web address]  Consent: (Patient) Christy Cox provided verbal consent for this virtual visit at the beginning of the encounter.  Current Medications:  Current Outpatient Medications:    predniSONE (STERAPRED UNI-PAK 21 TAB) 10 MG (21) TBPK tablet, 6 day taper; take as directed on package instructions, Disp: 21 tablet, Rfl: 0   DULoxetine (CYMBALTA) 30 MG capsule, Take 30 mg by mouth., Disp: , Rfl:    mirabegron ER (MYRBETRIQ) 25 MG TB24 tablet, Take 50 mg by mouth daily., Disp: , Rfl:    sulfamethoxazole-trimethoprim (BACTRIM DS) 800-160 MG tablet, Take 1 tablet by mouth daily., Disp: , Rfl:  No current facility-administered medications for this visit.  Facility-Administered Medications Ordered in Other Visits:    botulinum toxin Type A (BOTOX) injection 100 Units, 100 Units, Intramuscular, Once, Winter, Dorian Furnace, MD   Medications ordered in this encounter:  Meds ordered this encounter  Medications   predniSONE (STERAPRED UNI-PAK 21 TAB) 10 MG (21) TBPK tablet    Sig: 6 day taper; take as directed on package instructions    Dispense:  21 tablet    Refill:  0    Supervising Provider:   Merrilee Jansky [phone]     *If you need refills on other medications prior to your next appointment, please contact your pharmacy*  Follow-Up: Call back or seek an in-person evaluation if the symptoms worsen or if the condition fails to improve as anticipated.  [facility] [phone]  Other Instructions Viral Illness, Adult Viruses are tiny germs that can get into a person's body and cause illness. There are many different types of viruses. And they cause many types of illness. Viral illnesses can range from mild to severe. They can affect various parts of the body. Short-term conditions that are caused by a virus include colds and flu (influenza) and stomach viruses. Long-term conditions that are caused by a virus include herpes, shingles, and human immunodeficiency virus (HIV) infection. A few viruses have been linked to certain cancers. What are the causes? Many types of viruses can cause illness. Viruses get into cells in your body, multiply, and cause the infected cells to work differently or die. When these cells die, they release more of the virus. When this happens, you get symptoms of the illness and the virus spreads to other cells. If the virus takes over how the cell works, it can cause the cell to divide and grow out of control. This happens when a virus causes cancer. Different viruses get into the body in different ways. You can get a virus by: Swallowing food or water that has come in contact with the virus. Breathing in droplets that have been coughed or sneezed into the air by an infected person. Touching a surface that has the virus on it and then touching your eyes, nose, or mouth. Being bitten by an insect or animal that carries the virus. Having sexual contact with a person who is infected with the virus. Being exposed to  blood or fluids that contain the virus, either through an open cut or during a transfusion. If a virus enters your body, your body's disease-fighting system (immune system) will try to fight the virus. You may be at higher risk for a viral illness if your immune system is weak. What are the signs or symptoms? Symptoms depend on the type of virus and the location of the cells that it gets into. Symptoms can include: For  cold and flu viruses: Fever. Headache. Sore throat. Muscle aches. Stuffy nose (nasal congestion). Cough. For stomach (gastrointestinal) viruses: Fever. Pain in the abdomen. Nausea or vomiting. Diarrhea. For liver viruses (hepatitis): Loss of appetite. Feeling tired. Skin or the white parts of your eyes turning yellow (jaundice). For brain and spinal cord viruses: Fever. Headache. Stiff neck. Nausea and vomiting. Confusion or being sleepy. For skin viruses: Warts. Itching. Rash. For sexually transmitted viruses: Discharge. Swelling. Redness. Rash. How is this diagnosed? This condition may be diagnosed based on one or more of these: Your symptoms and medical history. A physical exam. Tests, such as: Blood tests. Tests on a sample of mucus from your lungs (sputum sample). Tests on a poop (stool) sample. Tests on a swab of body fluids or a skin sore (lesion). How is this treated? Viruses can be hard to treat because they live within cells. Antibiotics do not treat viruses because these medicines do not get inside cells. Treatment for a viral illness may include: Resting and drinking a lot of fluids. Medicines to treat symptoms. These can include over-the-counter medicine for pain and fever, medicines for cough or congestion, and medicines for diarrhea. Antiviral medicines. These medicines are available only for certain types of viruses. Some viral illnesses can be prevented with vaccinations. A common example is the flu shot. Follow these instructions at home: Medicines Take over-the-counter and prescription medicines only as told by your health care provider. If you were prescribed an antiviral medicine, take it as told by your provider. Do not stop taking the antiviral even if you start to feel better. Know when antibiotics are needed and when they are not needed. Antibiotics do not treat viruses. You may get an antibiotic if your provider thinks that you may have, or  are at risk for, a bacterial infection and you have a viral infection. Do not ask for an antibiotic prescription if you have been diagnosed with a viral illness. Antibiotics will not make your illness go away faster. Taking antibiotics when they are not needed can lead to antibiotic resistance. When this develops, the medicine no longer works against the bacteria that it normally fights. General instructions Drink enough fluids to keep your pee (urine) pale yellow. Rest as much as possible. Return to your normal activities as told by your provider. Ask your provider what activities are safe for you. How is this prevented? To lower your risk of getting another viral illness: Wash your hands often with soap and water for at least 20 seconds. If soap and water are not available, use hand sanitizer. Avoid touching your nose, eyes, and mouth, especially if you have not washed your hands recently. If anyone in your household has a viral infection, clean all household surfaces that may have been in contact with the virus. Use soap and hot water. You may also use a commercially prepared, bleach-containing solution. Stay away from people who are sick with symptoms of a viral infection. Do not share items such as toothbrushes and water bottles with other people. Keep your  vaccinations up to date. This includes getting a yearly flu shot. Eat a healthy diet and get plenty of rest. Contact a health care provider if: You have symptoms of a viral illness that do not go away. Your symptoms come back after going away. Your symptoms get worse. Get help right away if: You have trouble breathing. You have a severe headache or a stiff neck. You have severe vomiting or pain in your abdomen. These symptoms may be an emergency. Get help right away. Call 911. Do not wait to see if the symptoms will go away. Do not drive yourself to the hospital. This information is not intended to replace advice given to you by  your health care provider. Make sure you discuss any questions you have with your health care provider. Document Revised: 03/02/2022 Document Reviewed: 12/15/2021 Elsevier Patient Education  2024 Elsevier Inc.   If you have been instructed to have an in-person evaluation today at a local Urgent Care facility, please use the link below. It will take you to a list of all of our available [facility], including address, phone number and hours of operation. Please do not delay care.  [facility]  If you or a family member do not have a primary care provider, use the link below to schedule a visit and establish care. When you choose a [facility] primary care physician or advanced practice provider, you gain a long-term partner in health. Find a Primary Care Provider  Learn more about [facility]'s in-office and virtual care options: [facility] Get Care Now

## 2023-04-20 NOTE — Progress Notes (Signed)
Virtual Visit Consent   Christy Cox, you are scheduled for a virtual visit with a [facility] provider today. Just as with appointments in the office, your consent must be obtained to participate. Your consent will be active for this visit and any virtual visit you may have with one of our providers in the next 365 days. If you have a MyChart account, a copy of this consent can be sent to you electronically.  As this is a virtual visit, video technology does not allow for your provider to perform a traditional examination. This may limit your provider's ability to fully assess your condition. If your provider identifies any concerns that need to be evaluated in person or the need to arrange testing (such as labs, EKG, etc.), we will make arrangements to do so. Although advances in technology are sophisticated, we cannot ensure that it will always work on either your end or our end. If the connection with a video visit is poor, the visit may have to be switched to a telephone visit. With either a video or telephone visit, we are not always able to ensure that we have a secure connection.  By engaging in this virtual visit, you consent to the provision of healthcare and authorize for your insurance to be billed (if applicable) for the services provided during this visit. Depending on your insurance coverage, you may receive a charge related to this service.  I need to obtain your verbal consent now. Are you willing to proceed with your visit today? [name] has provided verbal consent on 04/20/2023 for a virtual visit (video or telephone). [name], PA-C  Date: 04/20/2023 9:32 AM   Virtual Visit via Video Note   I, [name], connected with  Christy Cox  ([id number], [birth year]) on 04/20/23 at  9:30 AM EST by a video-enabled telemedicine application and verified that I am speaking with the correct person using two identifiers.  Location: Patient: Virtual Visit  Location Patient: Home Provider: Virtual Visit Location Provider: Home Office   I discussed the limitations of evaluation and management by telemedicine and the availability of in person appointments. The patient expressed understanding and agreed to proceed.    History of Present Illness: Christy Cox is a 48 y.o. who identifies as a female who was assigned female at birth, and is being seen today for URI symptoms, but significant headache for 4 days.  HPI: URI  This is a new problem. The current episode started in the past 7 days (4 days). The problem has been unchanged. There has been no fever. Associated symptoms include congestion, coughing (minimal; dry), headaches, rhinorrhea (and post nasal drainage) and a sore throat (from drainage). Pertinent negatives include no chest pain, diarrhea, ear pain, nausea, plugged ear sensation, sinus pain, vomiting or wheezing. Associated symptoms comments: Fatigue, body aches. Treatments tried: tylenol, theraflu. The treatment provided no relief.   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, Disp: , Rfl:    mirabegron ER (MYRBETRIQ) 25 MG TB24 tablet, Take 50 mg by mouth daily., Disp: , Rfl:    sulfamethoxazole-trimethoprim (BACTRIM DS) 800-160 MG tablet, Take 1 tablet by mouth daily., Disp: , Rfl:  No current facility-administered medications for this visit.  Facility-Administered Medications Ordered in Other Visits:    botulinum toxin Type A (BOTOX) injection 100 Units, 100 Units, Intramuscular, Once, Winter, Dorian Furnace, MD  Observations/Objective: Patient is well-developed, well-nourished in no acute distress.  Resting comfortably at home.  Head is normocephalic, atraumatic.  No labored breathing.  Speech is clear and coherent with logical content.  Patient is alert and oriented at baseline.    Assessment and Plan: 1. Viral URI (Primary) - predniSONE (STERAPRED UNI-PAK 21 TAB) 10 MG (21) TBPK tablet; 6 day taper; take as directed on package instructions  Dispense: 21 tablet; Refill: 0  - Suspect viral URI - Symptomatic medications of choice over the counter as needed - Prednisone taper pack added to break headache cysle - Push fluids - Rest - Seek further evaluation if symptoms change or worsen   Follow Up Instructions: I discussed the assessment and treatment plan with the patient. The patient was provided an opportunity to ask questions and all were answered. The patient agreed with the plan and demonstrated an understanding of the instructions.  A copy of instructions were sent to the patient via MyChart unless otherwise noted below.    The patient was advised to call back or seek an in-person evaluation if the symptoms worsen or if the condition fails to improve as anticipated.    [name], PA-C

## 2023-04-26 DIAGNOSIS — R768 Other specified abnormal immunological findings in serum: Secondary | ICD-10-CM | POA: Diagnosis not present

## 2023-04-26 DIAGNOSIS — M791 Myalgia, unspecified site: Secondary | ICD-10-CM | POA: Diagnosis not present

## 2023-04-26 DIAGNOSIS — G35 Multiple sclerosis: Secondary | ICD-10-CM | POA: Diagnosis not present

## 2023-05-01 DIAGNOSIS — R350 Frequency of micturition: Secondary | ICD-10-CM | POA: Diagnosis not present

## 2023-05-01 DIAGNOSIS — N3941 Urge incontinence: Secondary | ICD-10-CM | POA: Diagnosis not present

## 2023-05-11 DIAGNOSIS — R339 Retention of urine, unspecified: Secondary | ICD-10-CM | POA: Diagnosis not present

## 2023-05-16 DIAGNOSIS — M25562 Pain in left knee: Secondary | ICD-10-CM | POA: Diagnosis not present

## 2023-05-18 DIAGNOSIS — R350 Frequency of micturition: Secondary | ICD-10-CM | POA: Diagnosis not present

## 2023-05-18 DIAGNOSIS — N3941 Urge incontinence: Secondary | ICD-10-CM | POA: Diagnosis not present

## 2023-05-18 DIAGNOSIS — N319 Neuromuscular dysfunction of bladder, unspecified: Secondary | ICD-10-CM | POA: Diagnosis not present

## 2023-05-30 ENCOUNTER — Ambulatory Visit: Admitting: Nurse Practitioner

## 2023-05-30 ENCOUNTER — Encounter: Payer: Self-pay | Admitting: Nurse Practitioner

## 2023-05-30 VITALS — BP 98/66 | HR 76 | Temp 98.7°F | Resp 17 | Ht 67.01 in | Wt 205.2 lb

## 2023-05-30 DIAGNOSIS — E669 Obesity, unspecified: Secondary | ICD-10-CM | POA: Diagnosis not present

## 2023-05-30 DIAGNOSIS — Z0000 Encounter for general adult medical examination without abnormal findings: Secondary | ICD-10-CM

## 2023-05-30 MED ORDER — WEGOVY 0.25 MG/0.5ML ~~LOC~~ SOAJ: 0.2500 mg | SUBCUTANEOUS | 0 refills | Status: DC

## 2023-05-30 MED ORDER — WEGOVY 0.5 MG/0.5ML ~~LOC~~ SOAJ: 0.5000 mg | SUBCUTANEOUS | 2 refills | Status: DC

## 2023-05-30 NOTE — Addendum Note (Signed)
 Addended by: Larae Grooms on: 05/30/2023 02:42 PM   Modules accepted: Level of Service

## 2023-05-30 NOTE — Progress Notes (Signed)
 BP 98/66 (BP Location: Right Arm, Patient Position: Sitting, Cuff Size: Large)   Pulse 76   Temp 98.7 F (37.1 C) (Oral)   Resp 17   Ht 5' 7.01" (1.702 m)   Wt 205 lb 3.2 oz (93.1 kg)   LMP 07/30/2015 (Approximate)   SpO2 98%   BMI 32.13 kg/m    Subjective:    Patient ID: Christy Cox, female    DOB: Jan 18, 1976, 48 y.o.   MRN: [id number]  HPI: Christy Cox is a 48 y.o. female  Chief Complaint  Patient presents with   Obesity    Post bariatric sleeve, 3 years in August.    WEIGHT GAIN Duration: years Previous attempts at weight loss: yes Complications of obesity:  Peak weight: bartric surgery 3 years ago Weight loss goal: 180lb Weight loss to date: 6 months Requesting obesity pharmacotherapy: yes Current weight loss supplements/medications: no Previous weight loss supplements/meds: yes- has taken phentermine in the past. Calories:   Clinical coverage for weight loss GLP's   Medication being dispensed is Wegovy 3 mL/28 day. Titration doses are 2 mL/28 days.   [x]  Product being prescribed is FDA approved for the indication, age, weight (if applicable) and not does not exceed dosing limits per the Prescribing Information per the clinical conditions for use.  [x]  Patient's baseline weight measured within the last 45 days as required by provider before dispensing.  [x]  Patient is new to therapy and One of the following:   [x]  The beneficiary is 3 years of age or older with a BMI greater than or equal to 27 kg/m2 AND has established cardiovascular disease (CVD) defined as having a history of myocardial infarction, stroke, or symptomatic peripheral disease, to be documented on the PA form. AND  [x]  The beneficiary is currently on and will continue lifestyle modification including structured nutrition and physical activity, unless physical activity is not clinically appropriate at the time GLP1 therapy commences AND  []  The beneficiary will NOT be using the  requested agent in combination with another GLP-1 receptor agonist agent AND  []  The beneficiary does NOT have any FDA-labeled contraindications to the requested agent, including pregnancy, lactation, history of medullary thyroid cancer or multiple endocrine neoplasia type II.   Last BMI/Weight/Height recorded Estimated body mass index is 32.13 kg/m as calculated from the following:   Height as of this encounter: 5' 7.01" (1.702 m).   Weight as of this encounter: 205 lb 3.2 oz (93.1 kg).      Relevant past medical, surgical, family and social history reviewed and updated as indicated. Interim medical history since our last visit reviewed. Allergies and medications reviewed and updated.  Review of Systems  Constitutional:  Positive for unexpected weight change.    Per HPI unless specifically indicated above     Objective:    BP 98/66 (BP Location: Right Arm, Patient Position: Sitting, Cuff Size: Large)   Pulse 76   Temp 98.7 F (37.1 C) (Oral)   Resp 17   Ht 5' 7.01" (1.702 m)   Wt 205 lb 3.2 oz (93.1 kg)   LMP 07/30/2015 (Approximate)   SpO2 98%   BMI 32.13 kg/m   Wt Readings from Last 3 Encounters:  05/30/23 205 lb 3.2 oz (93.1 kg)  03/22/23 200 lb 12.8 oz (91.1 kg)  10/06/22 186 lb 12.8 oz (84.7 kg)    Physical Exam Vitals and nursing note reviewed.  Constitutional:      General: She is not in acute distress.  Appearance: Normal appearance. She is obese. She is not ill-appearing, toxic-appearing or diaphoretic.  HENT:     Head: Normocephalic.     Right Ear: External ear normal.     Left Ear: External ear normal.     Nose: Nose normal.     Mouth/Throat:     Mouth: Mucous membranes are moist.     Pharynx: Oropharynx is clear.  Eyes:     General:        Right eye: No discharge.        Left eye: No discharge.     Extraocular Movements: Extraocular movements intact.     Conjunctiva/sclera: Conjunctivae normal.     Pupils: Pupils are equal, round, and  reactive to light.  Cardiovascular:     Rate and Rhythm: Normal rate and regular rhythm.     Heart sounds: No murmur heard. Pulmonary:     Effort: Pulmonary effort is normal. No respiratory distress.     Breath sounds: Normal breath sounds. No wheezing or rales.  Musculoskeletal:     Cervical back: Normal range of motion and neck supple.  Skin:    General: Skin is warm and dry.     Capillary Refill: Capillary refill takes less than 2 seconds.  Neurological:     General: No focal deficit present.     Mental Status: She is alert and oriented to person, place, and time. Mental status is at baseline.  Psychiatric:        Mood and Affect: Mood normal.        Behavior: Behavior normal.        Thought Content: Thought content normal.        Judgment: Judgment normal.     Results for orders placed or performed in visit on 03/21/23  HM PAP SMEAR   Collection Time: 11/25/21 12:00 AM  Result Value Ref Range   HM Pap smear see report scanned into chart       Assessment & Plan:   Problem List Items Addressed This Visit       Other   Obesity (BMI 30-39.9) - Primary   Chronic.  Not well controlled.  Patient has tried diet and exercise for weight loss without success.  Will start [facility] 0.25mg  weekly.  Will increase to [facility] 0.5mg  weekly after the first 4 weeks.  Discussed how to inject medication.  Discussed side effects and benefits of medication.  Follow up in 2 months.  Call sooner if concerns arise.        Relevant Medications   Semaglutide-Weight Management (WEGOVY) 0.25 MG/0.5ML SOAJ   Semaglutide-Weight Management (WEGOVY) 0.5 MG/0.5ML SOAJ     Follow up plan: Return in about 2 months (around 07/30/2023) for Weight Managment.

## 2023-05-30 NOTE — Addendum Note (Signed)
 Addended by: Ginette Pitman on: 05/30/2023 02:24 PM   Modules accepted: Orders, Level of Service

## 2023-05-30 NOTE — Progress Notes (Signed)
 Subjective:   Christy Cox is a 48 y.o. female who presents for Medicare Annual (Subsequent) preventive examination.  Visit Complete: In person  Patient Medicare AWV questionnaire was completed by the patient on 05/30/2023; I have confirmed that all information answered by patient is correct and no changes since this date.  Cardiac Risk Factors include: sedentary lifestyle;obesity (BMI >30kg/m2);Other (see comment), Risk factor comments: MS     Objective:    Today's Vitals   05/30/23 1337 05/30/23 1340  BP: 98/66   Pulse: 76   Resp: 17   Temp: 98.7 F (37.1 C)   TempSrc: Oral   SpO2: 98%   Weight: 205 lb 3.2 oz (93.1 kg)   Height: 5' 7.01" (1.702 m)   PainSc: 4  4    Body mass index is 32.13 kg/m.     05/30/2023    1:44 PM 02/22/2022    2:38 PM 10/13/2021   11:34 AM 09/26/2017    9:24 AM 05/05/2017   10:40 AM  Advanced Directives  Does Patient Have a Medical Advance Directive? No No No No No  Would patient like information on creating a medical advance directive? No - Patient declined No - Patient declined No - Patient declined No - Patient declined No - Patient declined    Current Medications (verified) Outpatient Encounter Medications as of 05/30/2023  Medication Sig   DULoxetine (CYMBALTA) 30 MG capsule Take 30 mg by mouth. One every other day   mirabegron ER (MYRBETRIQ) 25 MG TB24 tablet Take 50 mg by mouth daily.   Semaglutide-Weight Management (WEGOVY) 0.25 MG/0.5ML SOAJ Inject 0.25 mg into the skin once a week.   Semaglutide-Weight Management (WEGOVY) 0.5 MG/0.5ML SOAJ Inject 0.5 mg into the skin once a week.   sulfamethoxazole-trimethoprim (BACTRIM DS) 800-160 MG tablet Take 1 tablet by mouth daily.   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   Multiple sclerosis [facility]) previous neurologist-- [facility].--  since stem cell transplant in 08/05/2015 done in [place], [place]    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   OAB (overactive bladder)    PONV (postoperative nausea and vomiting)    severe   Past Surgical History:  Procedure Laterality Date   CESAREAN SECTION  1999; 2003   BILATERAL TUBAL LIGATION W/ LAST C/S   CYSTO/  BOTOX INJECTION  multiple-- last one 2018 @ [facility]   CYSTOSCOPY WITH INJECTION N/A 09/26/2017   Procedure: CYSTOSCOPY WITH INJECTION/ BOTOX;  Surgeon: Rene Paci, MD;  Location: [facility];  Service: Urology;  Laterality: N/A;   HERNIA REPAIR  age 9 and age 16   Family History  Problem Relation Age of Onset   Breast cancer Mother 50   Heart attack Father 70       x2 at 26 them at 60, now s/p CABG   Kidney disease Father    Cancer Other    Coronary artery disease Other    Diabetes Other    Cancer Paternal Grandmother    Social History   Socioeconomic History  Marital status: Divorced    Spouse name: dennis   Number of children: 2   Years of education: [employer] education level: Associate degree: occupational, Scientist, product/process development, or vocational program  Occupational History   Occupation: n/a    Comment: disabled  Tobacco Use   Smoking status: Never    Passive exposure: Never   Smokeless tobacco: Never  Vaping Use   Vaping status: Never Used  Substance and Sexual Activity   Alcohol use: Not Currently   Drug use: No   Sexual activity: Not Currently    Birth control/protection: Surgical  Other Topics Concern   Not on file  Social History Narrative   Pt lives at home with her spouse.    Caffeine Use- 1-2 per  week   Social Drivers of Health   Financial Resource Strain: Low Risk  (03/22/2023)   Overall Financial Resource Strain (CARDIA)    Difficulty of Paying Living Expenses: Not very hard  Food Insecurity: No Food Insecurity (03/22/2023)   Hunger Vital Sign    Worried About Running Out of Food in the Last Year: Never true    Ran Out of Food in the Last Year: Never true  Transportation Needs: No Transportation Needs (03/22/2023)   PRAPARE - Administrator, Civil Service (Medical): No    Lack of Transportation (Non-Medical): No  Physical Activity: Insufficiently Active (03/22/2023)   Exercise Vital Sign    Days of Exercise per Week: 1 day    Minutes of Exercise per Session: 10 min  Stress: No Stress Concern Present (03/22/2023)   [employer] of Occupational Health - Occupational Stress Questionnaire    Feeling of Stress : Not at all  Social Connections: Moderately Integrated (03/22/2023)   Social Connection and Isolation Panel [NHANES]    Frequency of Communication with Friends and Family: More than three times a week    Frequency of Social Gatherings with Friends and Family: Twice a week    Attends Religious Services: More than 4 times per year    Active Member of [employer] or Organizations: Yes    Attends Engineer, structural: More than 4 times per year    Marital Status: Divorced    Tobacco Counseling Counseling given: Not Answered   Clinical Intake:  Pre-visit preparation completed: No  Pain : 0-10 Pain Score: 4  Pain Type: Chronic pain Pain Location: Knee Pain Orientation: Left Pain Descriptors / Indicators: Jabbing, Sharp, Shooting Pain Onset: More than a month ago Pain Frequency: Intermittent Effect of Pain on Daily Activities: Unable to do most activites     BMI - recorded: 32.13 Nutritional Status: BMI > 30  Obese Nutritional Risks: None Diabetes: No  How often do you need to have someone help you when you read instructions, pamphlets, or  other written materials from your doctor or pharmacy?: 1 - Never  Interpreter Needed?: No      Activities of Daily Living    05/30/2023    1:41 PM  In your present state of health, do you have any difficulty performing the following activities:  Hearing? 0  Vision? 1  Comment Wears glasses  Difficulty concentrating or making decisions? 0  Walking or climbing stairs? 1  Comment Because of knees  Dressing or bathing? 0  Doing errands, shopping? 0  Preparing Food and eating ? N  Using the Toilet? N  In the past six months, have you accidently leaked urine? Y  Comment CIC  Do you have problems with  loss of bowel control? N  Managing your Medications? N  Managing your Finances? N  Housekeeping or managing your Housekeeping? N    Patient Care Team: Larae Grooms, NP as PCP - General  Indicate any recent Medical Services you may have received from other than [facility] providers in the past year (date may be approximate).     Assessment:   This is a routine wellness examination for Nigel.  Hearing/Vision screen No results found.   Goals Addressed             This Visit's Progress    Activity and Exercise Increased       Evidence-based guidance:  Review current exercise levels.  Assess patient perspective on exercise or activity level, barriers to increasing activity, motivation and readiness for change.  Recommend or set healthy exercise goal based on individual tolerance.  Encourage small steps toward making change in amount of exercise or activity.  Urge reduction of sedentary activities or screen time.  Promote group activities within the community or with family or support person.  Consider referral to rehabiliation therapist for assessment and exercise/activity plan.   Notes:        Depression Screen    05/30/2023    1:36 PM 03/22/2023   11:34 AM 10/06/2022    9:24 AM 09/29/2021   10:13 AM 08/26/2021    1:18 PM 01/27/2020   11:04 AM 11/21/2018    9:19 AM   PHQ 2/9 Scores  PHQ - 2 Score 0 0 0 0 0 0 0  PHQ- 9 Score 1 0 0 0 3 3 1     Fall Risk    05/30/2023    1:35 PM 10/06/2022    9:25 AM 09/29/2021   10:12 AM 08/26/2021    1:18 PM 11/21/2018    9:19 AM  Fall Risk   Falls in the past year? 1 0 1 0 0  Number falls in past yr: 1 0 1 0   Injury with Fall? 1 0 0 0   Risk for fall due to : History of fall(s);Impaired balance/gait;Impaired mobility No Fall Risks History of fall(s) No Fall Risks   Follow up Falls evaluation completed Falls evaluation completed Falls evaluation completed Falls evaluation completed Falls evaluation completed    MEDICARE RISK AT HOME: Medicare Risk at Home Any stairs in or around the home?: Yes If so, are there any without handrails?: Yes Home free of loose throw rugs in walkways, pet beds, electrical cords, etc?: No Adequate lighting in your home to reduce risk of falls?: Yes Life alert?: No Use of a cane, walker or w/c?: No Grab bars in the bathroom?: No Shower chair or bench in shower?: Yes Elevated toilet seat or a handicapped toilet?: No  TIMED UP AND GO:  Was the test performed?  Yes  Length of time to ambulate 10 feet: 5 sec Gait steady and fast without use of assistive device    Cognitive Function:    05/30/2023    1:45 PM  MMSE - Mini Mental State Exam  Orientation to time 5  Orientation to Place 5  Registration 3  Attention/ Calculation 5  Recall 3  Language- name 2 objects 2  Language- repeat 1  Language- follow 3 step command 3  Language- read & follow direction 1  Write a sentence 1  Copy design 1  Total score 30        Immunizations  There is no immunization history on file for this patient.  TDAP status: Due, Education has been provided regarding the importance of this vaccine. Advised may receive this vaccine at local pharmacy or Health Dept. Aware to provide a copy of the vaccination record if obtained from local pharmacy or Health Dept. Verbalized acceptance and  understanding.  Flu Vaccine status: Declined, Education has been provided regarding the importance of this vaccine but patient still declined. Advised may receive this vaccine at local pharmacy or Health Dept. Aware to provide a copy of the vaccination record if obtained from local pharmacy or Health Dept. Verbalized acceptance and understanding.  Pneumococcal vaccine status: Declined,  Education has been provided regarding the importance of this vaccine but patient still declined. Advised may receive this vaccine at local pharmacy or Health Dept. Aware to provide a copy of the vaccination record if obtained from local pharmacy or Health Dept. Verbalized acceptance and understanding.   Covid-19 vaccine status: Declined, Education has been provided regarding the importance of this vaccine but patient still declined. Advised may receive this vaccine at local pharmacy or Health Dept.or vaccine clinic. Aware to provide a copy of the vaccination record if obtained from local pharmacy or Health Dept. Verbalized acceptance and understanding.  Qualifies for Shingles Vaccine? Yes   Zostavax completed Yes   Shingrix Completed?: No.    Education has been provided regarding the importance of this vaccine. Patient has been advised to call insurance company to determine out of pocket expense if they have not yet received this vaccine. Advised may also receive vaccine at local pharmacy or Health Dept. Verbalized acceptance and understanding.  Screening Tests Health Maintenance  Topic Date Due   Medicare Annual Wellness (AWV)  05/06/2018   COVID-19 Vaccine (1) 06/15/2023 (Originally 03/29/1980)   DTaP/Tdap/Td (1 - Tdap) 05/29/2024 (Originally 03/29/1994)   Pneumococcal Vaccine 74-42 Years old (1 of 2 - PCV) 05/29/2024 (Originally 03/29/1981)   INFLUENZA VACCINE  09/29/2023   Colonoscopy  10/11/2024   Cervical Cancer Screening (HPV/Pap Cotest)  11/25/2024   Hepatitis C Screening  Completed   HIV Screening   Completed   HPV VACCINES  Aged Out    Health Maintenance  Health Maintenance Due  Topic Date Due   Medicare Annual Wellness (AWV)  05/06/2018    Colorectal cancer screening: Type of screening: Cologuard. Completed 10/11/2021. Repeat every 3 years  Mammogram status: Completed 07/23/2021. Repeat every year.  Bone Density Screening: Not yet due  Lung Cancer Screening: (Low Dose CT Chest recommended if Age 27-80 years, 20 pack-year currently smoking OR have quit w/in 15years.) does not qualify.   Additional Screening:  Hepatitis C Screening: does qualify; Completed 1975/11/07  Vision Screening: Recommended annual ophthalmology exams for early detection of glaucoma and other disorders of the eye. Is the patient up to date with their annual eye exam?  No  If pt is not established with a provider, would they like to be referred to a provider to establish care? Yes .   Dental Screening: Recommended annual dental exams for proper oral hygiene  Diabetic Foot Exam: N/A  Community Resource Referral / Chronic Care Management: CRR required this visit?  No   CCM required this visit?  No     Plan:     I have personally reviewed and noted the following in the patient's chart:   Medical and social history Use of alcohol, tobacco or illicit drugs  Current medications and supplements including opioid prescriptions. Patient is not currently taking opioid prescriptions. Functional ability and status Nutritional status Physical activity Advanced directives  List of other physicians Hospitalizations, surgeries, and ER visits in previous 12 months Vitals Screenings to include cognitive, depression, and falls Referrals and appointments  In addition, I have reviewed and discussed with patient certain preventive protocols, quality metrics, and best practice recommendations. A written personalized care plan for preventive services as well as general preventive health recommendations were  provided to patient.     Jasmine Pang Fox Salminen, CMA   05/30/2023   After Visit Summary: (In Person-Printed) AVS printed and given to the patient

## 2023-05-30 NOTE — Assessment & Plan Note (Signed)
 Chronic.  Not well controlled.  Patient has tried diet and exercise for weight loss without success.  Will start [facility] 0.25mg  weekly.  Will increase to [facility] 0.5mg  weekly after the first 4 weeks.  Discussed how to inject medication.  Discussed side effects and benefits of medication.  Follow up in 2 months.  Call sooner if concerns arise.

## 2023-06-05 ENCOUNTER — Encounter: Payer: Self-pay | Admitting: Nurse Practitioner

## 2023-06-07 DIAGNOSIS — M25562 Pain in left knee: Secondary | ICD-10-CM | POA: Diagnosis not present

## 2023-06-09 DIAGNOSIS — M1712 Unilateral primary osteoarthritis, left knee: Secondary | ICD-10-CM | POA: Diagnosis not present

## 2023-06-09 DIAGNOSIS — S83272A Complex tear of lateral meniscus, current injury, left knee, initial encounter: Secondary | ICD-10-CM | POA: Diagnosis not present

## 2023-06-15 DIAGNOSIS — R339 Retention of urine, unspecified: Secondary | ICD-10-CM | POA: Diagnosis not present

## 2023-06-21 ENCOUNTER — Encounter: Payer: Self-pay | Admitting: Nurse Practitioner

## 2023-06-22 NOTE — Telephone Encounter (Signed)
 Appointment has been made

## 2023-06-23 ENCOUNTER — Ambulatory Visit (INDEPENDENT_AMBULATORY_CARE_PROVIDER_SITE_OTHER): Admitting: Nurse Practitioner

## 2023-06-23 ENCOUNTER — Encounter: Payer: Self-pay | Admitting: Nurse Practitioner

## 2023-06-23 VITALS — BP 86/56 | HR 72 | Temp 97.4°F | Ht 67.0 in | Wt 202.4 lb

## 2023-06-23 DIAGNOSIS — M7989 Other specified soft tissue disorders: Secondary | ICD-10-CM | POA: Diagnosis not present

## 2023-06-23 NOTE — Progress Notes (Signed)
 BP (!) 86/56   Pulse 72   Temp (!) 97.4 F (36.3 C) (Oral)   Ht 5\' 7"  (1.702 m)   Wt 202 lb 6.4 oz (91.8 kg)   LMP 07/30/2015 (Approximate)   SpO2 98%   BMI 31.70 kg/m    Subjective:    Patient ID: Christy Cox, female    DOB: 09-Jun-1975, 48 y.o.   MRN: [id number]  HPI: Christy Cox is a 48 y.o. female  Chief Complaint  Patient presents with   Fall    Patient states she had a fall about 3 days ago, bilateral leg swelling started after the fall.    SWELLING Patient presents to clinic with complaints of swelling in low extremities x 3 days.  She hasn't been on her feet more.  Drinking plenty of water .  Denies SOB, lightheadedness and dizziness and chest pain.    Relevant past medical, surgical, family and social history reviewed and updated as indicated. Interim medical history since our last visit reviewed. Allergies and medications reviewed and updated.  Review of Systems  Respiratory:  Negative for shortness of breath.   Cardiovascular:  Positive for leg swelling. Negative for chest pain.  Neurological:  Negative for dizziness and light-headedness.    Per HPI unless specifically indicated above     Objective:    BP (!) 86/56   Pulse 72   Temp (!) 97.4 F (36.3 C) (Oral)   Ht 5\' 7"  (1.702 m)   Wt 202 lb 6.4 oz (91.8 kg)   LMP 07/30/2015 (Approximate)   SpO2 98%   BMI 31.70 kg/m   Wt Readings from Last 3 Encounters:  06/23/23 202 lb 6.4 oz (91.8 kg)  05/30/23 205 lb 3.2 oz (93.1 kg)  03/22/23 200 lb 12.8 oz (91.1 kg)    Physical Exam Vitals and nursing note reviewed.  Constitutional:      General: She is not in acute distress.    Appearance: Normal appearance. She is normal weight. She is not ill-appearing, toxic-appearing or diaphoretic.  HENT:     Head: Normocephalic.     Right Ear: External ear normal.     Left Ear: External ear normal.     Nose: Nose normal.     Mouth/Throat:     Mouth: Mucous membranes are moist.     Pharynx: Oropharynx  is clear.  Eyes:     General:        Right eye: No discharge.        Left eye: No discharge.     Extraocular Movements: Extraocular movements intact.     Conjunctiva/sclera: Conjunctivae normal.     Pupils: Pupils are equal, round, and reactive to light.  Cardiovascular:     Rate and Rhythm: Normal rate and regular rhythm.     Heart sounds: No murmur heard. Pulmonary:     Effort: Pulmonary effort is normal. No respiratory distress.     Breath sounds: Normal breath sounds. No wheezing or rales.  Musculoskeletal:     Cervical back: Normal range of motion and neck supple.     Right lower leg: 1+ Edema present.     Left lower leg: 1+ Edema present.  Skin:    General: Skin is warm and dry.     Capillary Refill: Capillary refill takes less than 2 seconds.  Neurological:     General: No focal deficit present.     Mental Status: She is alert and oriented to person, place, and time. Mental status  is at baseline.  Psychiatric:        Mood and Affect: Mood normal.        Behavior: Behavior normal.        Thought Content: Thought content normal.        Judgment: Judgment normal.     Results for orders placed or performed in visit on 05/30/23  [facility] HEPATITIS C SCREENING LAB   Collection Time: 03-05-1975 12:00 AM  Result Value Ref Range   [facility] Hepatitis Screen Negative-Validated       Assessment & Plan:   Problem List Items Addressed This Visit   None Visit Diagnoses       Bilateral swelling of feet    -  Primary   No red flags on exam.  Increase water  intake and elevated feet.  Follow up in 6 weeks for reevaluation.        Follow up plan: Return if symptoms worsen or fail to improve.

## 2023-06-27 DIAGNOSIS — S83242D Other tear of medial meniscus, current injury, left knee, subsequent encounter: Secondary | ICD-10-CM | POA: Diagnosis not present

## 2023-07-11 DIAGNOSIS — M94262 Chondromalacia, left knee: Secondary | ICD-10-CM | POA: Diagnosis not present

## 2023-07-11 DIAGNOSIS — M1712 Unilateral primary osteoarthritis, left knee: Secondary | ICD-10-CM | POA: Diagnosis not present

## 2023-07-11 DIAGNOSIS — S83272A Complex tear of lateral meniscus, current injury, left knee, initial encounter: Secondary | ICD-10-CM | POA: Diagnosis not present

## 2023-07-11 DIAGNOSIS — S83282A Other tear of lateral meniscus, current injury, left knee, initial encounter: Secondary | ICD-10-CM | POA: Diagnosis not present

## 2023-07-18 DIAGNOSIS — M25562 Pain in left knee: Secondary | ICD-10-CM | POA: Diagnosis not present

## 2023-07-18 DIAGNOSIS — R339 Retention of urine, unspecified: Secondary | ICD-10-CM | POA: Diagnosis not present

## 2023-07-18 DIAGNOSIS — R600 Localized edema: Secondary | ICD-10-CM | POA: Diagnosis not present

## 2023-07-20 ENCOUNTER — Other Ambulatory Visit (HOSPITAL_COMMUNITY): Payer: Self-pay

## 2023-07-20 ENCOUNTER — Ambulatory Visit
Admission: RE | Admit: 2023-07-20 | Discharge: 2023-07-20 | Disposition: A | Discharge status: Home / Self Care | Source: Ambulatory Visit | Attending: Emergency Medicine

## 2023-07-20 VITALS — BP 98/62 | HR 73 | Temp 97.8°F | Resp 18

## 2023-07-20 DIAGNOSIS — R300 Dysuria: Secondary | ICD-10-CM | POA: Diagnosis not present

## 2023-07-20 DIAGNOSIS — R8290 Unspecified abnormal findings in urine: Secondary | ICD-10-CM | POA: Diagnosis not present

## 2023-07-20 DIAGNOSIS — R350 Frequency of micturition: Secondary | ICD-10-CM | POA: Insufficient documentation

## 2023-07-20 DIAGNOSIS — N319 Neuromuscular dysfunction of bladder, unspecified: Secondary | ICD-10-CM | POA: Diagnosis not present

## 2023-07-20 LAB — POCT URINALYSIS DIP (MANUAL ENTRY)
Bilirubin, UA: NEGATIVE
Blood, UA: NEGATIVE
Glucose, UA: NEGATIVE mg/dL
Ketones, POC UA: NEGATIVE mg/dL
Nitrite, UA: NEGATIVE
Protein Ur, POC: NEGATIVE mg/dL
Spec Grav, UA: 1.02 (ref 1.010–1.025)
Urobilinogen, UA: 1 U/dL
pH, UA: 7 (ref 5.0–8.0)

## 2023-07-20 MED ORDER — CEPHALEXIN 500 MG PO CAPS: 500.0000 mg | ORAL_CAPSULE | Freq: Three times a day (TID) | ORAL | 0 refills | Status: AC

## 2023-07-20 NOTE — ED Triage Notes (Signed)
 Patient to Urgent Care with complaints of cloudy urine/ urinary frequency. Denies any fevers.  Patient self-caths d/t MS. Symptoms started 2-3 days ago.

## 2023-07-20 NOTE — Discharge Instructions (Addendum)
Take the antibiotic as directed.  The urine culture is pending.  We will call you if it shows the need to change or discontinue your antibiotic.    Follow up with your primary care provider or urologist if your symptoms are not improving.     

## 2023-07-20 NOTE — ED Provider Notes (Signed)
 Christy Cox    CSN: [id number] Arrival date & time: 07/20/23  0850      History   Chief Complaint Chief Complaint  Patient presents with   Urinary Frequency    Pretty sure I have a UTI and need to have a urinalysis. - Entered by patient    HPI Christy Cox is a 48 y.o. female.  Patient presents with urinary frequency and cloudy urine x 2 to 3 days.  She has neurogenic bladder and self-caths for urine (multiple sclerosis).  She is followed by urology at [facility].  She is on daily lower dose Bactrim  for UTI prevention.  She denies fever, chills, abdominal pain, flank pain, hematuria.  The history is provided by the patient and medical records.    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   Multiple sclerosis [facility]) previous neurologist-- [facility].--  since stem cell transplant in 08/05/2015 done in [place], [place]    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   OAB (overactive bladder)    PONV (postoperative nausea and vomiting)    severe    Patient Active Problem List   Diagnosis Date Noted   Obesity (BMI 30-39.9) 05/30/2023   Advanced care planning/counseling discussion 10/06/2022   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   Recurrent UTI 02/03/2018   Strain of trapezius muscle 12/06/2017   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   Overactive bladder 05/24/2013   Incomplete bladder emptying 05/24/2013   Urinary retention 05/24/2013   Multiple sclerosis (HCC) 05/31/2012   DS (disseminated sclerosis) (HCC) 05/31/2012    Past Surgical History:  Procedure Laterality Date   CESAREAN SECTION  1999; 2003   BILATERAL TUBAL LIGATION W/ LAST C/S   CYSTO/  BOTOX  INJECTION  multiple-- last one 2018 @ [facility]   CYSTOSCOPY WITH INJECTION N/A 09/26/2017   Procedure: CYSTOSCOPY WITH INJECTION/ BOTOX ;  Surgeon: Adelbert Homans, MD;  Location: [facility];  Service: Urology;  Laterality: N/A;   HERNIA REPAIR  age 38 and age 81   KNEE ARTHROSCOPY W/ MEDIAL COLLATERAL LIGAMENT (MCL) REPAIR Left     OB History   No obstetric history on file.      Home Medications    Prior to Admission medications   Medication Sig Start Date End Date Taking? Authorizing Provider  cephALEXin (KEFLEX) 500 MG capsule Take 1 capsule (500 mg total) by mouth 3 (three) times daily for 5 days. 07/20/23 07/25/23 Yes Wellington Half, NP  DULoxetine (CYMBALTA) 20 MG capsule Take 20 mg by mouth daily.   Yes [provider]  MYRBETRIQ 50 MG TB24 tablet Take 50 mg by mouth daily. 06/15/23   [provider]  Semaglutide -Weight Management (WEGOVY ) 0.25 MG/0.5ML SOAJ Inject 0.25 mg into the skin once a week. Patient not taking: Reported on 06/23/2023 05/30/23   Christy Alexanders, NP  Semaglutide -Weight Management (WEGOVY ) 0.5 MG/0.5ML SOAJ Inject 0.5 mg into the skin once a week. Patient not taking: Reported on 06/23/2023 05/30/23   Christy Alexanders, NP  sulfamethoxazole -trimethoprim  (BACTRIM  DS) 800-160 MG tablet Take 1 tablet by mouth daily. Patient not taking: Reported on 07/20/2023 12/31/19   [provider]    Family History Family History  Problem Relation Age of Onset   Breast cancer Mother 17   Heart attack Father 81       x2  at 39 them at 71, now s/p CABG   Kidney disease Father    Cancer Other    Coronary artery disease Other    Diabetes Other    Cancer Paternal Grandmother     Social History Social History   Tobacco Use   Smoking status: Never    Passive exposure: Never   Smokeless tobacco: Never  Vaping Use   Vaping status: Never Used  Substance Use Topics   Alcohol use: Not Currently   Drug use: No     Allergies   Macrodantin [nitrofurantoin macrocrystal]   Review of Systems Review of Systems  Constitutional:  Negative for chills and fever.  Gastrointestinal:   Negative for abdominal pain, nausea and vomiting.  Genitourinary:  Positive for frequency. Negative for dysuria and hematuria.     Physical Exam Triage Vital Signs ED Triage Vitals  Encounter Vitals Group     BP 07/20/23 0907 98/62     Systolic BP Percentile --      Diastolic BP Percentile --      Pulse Rate 07/20/23 0907 73     Resp 07/20/23 0907 18     Temp 07/20/23 0907 97.8 F (36.6 C)     Temp src --      SpO2 07/20/23 0907 98 %     Weight --      Height --      Head Circumference --      Peak Flow --      Pain Score 07/20/23 0859 0     Pain Loc --      Pain Education --      Exclude from Growth Chart --    No data found.  Updated Vital Signs BP 98/62   Pulse 73   Temp 97.8 F (36.6 C)   Resp 18   LMP 07/30/2015 (Approximate)   SpO2 98%   Visual Acuity Right Eye Distance:   Left Eye Distance:   Bilateral Distance:    Right Eye Near:   Left Eye Near:    Bilateral Near:     Physical Exam Constitutional:      General: She is not in acute distress. HENT:     Mouth/Throat:     Mouth: Mucous membranes are moist.  Cardiovascular:     Rate and Rhythm: Normal rate and regular rhythm.  Pulmonary:     Effort: Pulmonary effort is normal. No respiratory distress.  Abdominal:     General: Bowel sounds are normal.     Palpations: Abdomen is soft.     Tenderness: There is no abdominal tenderness. There is no right CVA tenderness, left CVA tenderness, guarding or rebound.  Neurological:     Mental Status: She is alert.      UC Treatments / Results  Labs (all labs ordered are listed, but only abnormal results are displayed) Labs Reviewed  POCT URINALYSIS DIP (MANUAL ENTRY) - Abnormal; Notable for the following components:      Result Value   Clarity, UA turbid (*)    Leukocytes, UA Moderate (2+) (*)    All other components within normal limits  URINE CULTURE    EKG   Radiology No results found.  Procedures Procedures (including critical care  time)  Medications Ordered in UC Medications - No data to display  Initial Impression / Assessment and Plan / UC Course  I have reviewed the triage vital signs and the nursing notes.  Pertinent labs & imaging results that were available during  my care of the patient were reviewed by me and considered in my medical decision making (see chart for details).   Neurogenic bladder, cloudy urine, urinary frequency.  Has multiple sclerosis and has to self cath for urine.  She is followed by urology at [facility].  Treating with Keflex. Urine culture pending. Discussed with patient that we will call her if the urine culture shows the need to change or discontinue the antibiotic. Instructed her to follow-up with her PCP or urologist if her symptoms are not improving. Patient agrees to plan of care.      Final Clinical Impressions(s) / UC Diagnoses   Final diagnoses:  Neurogenic bladder  Cloudy urine  Urinary frequency     Discharge Instructions      Take the antibiotic as directed.  The urine culture is pending.  We will call you if it shows the need to change or discontinue your antibiotic.    Follow-up with your primary care provider or urologist if your symptoms are not improving.    ED Prescriptions     Medication Sig Dispense Auth. Provider   cephALEXin (KEFLEX) 500 MG capsule Take 1 capsule (500 mg total) by mouth 3 (three) times daily for 5 days. 15 capsule Wellington Half, NP      PDMP not reviewed this encounter.   Wellington Half, NP 07/20/23 0930

## 2023-07-21 ENCOUNTER — Telehealth: Payer: Self-pay

## 2023-07-21 DIAGNOSIS — R600 Localized edema: Secondary | ICD-10-CM | POA: Diagnosis not present

## 2023-07-21 DIAGNOSIS — M25562 Pain in left knee: Secondary | ICD-10-CM | POA: Diagnosis not present

## 2023-07-21 NOTE — Telephone Encounter (Signed)
 Pharmacy Patient Advocate Encounter   Received notification from CoverMyMeds that prior authorization for Wegovy  0.5MG /0.5ML auto-injectors is required/requested.   Insurance verification completed.   The patient is insured through [employer] .   Per test claim: PA required; PA submitted to above mentioned insurance via CoverMyMeds Key/confirmation #/EOC [facility] Status is pending

## 2023-07-22 LAB — URINE CULTURE: Culture: 70000 — AB

## 2023-07-24 ENCOUNTER — Ambulatory Visit (HOSPITAL_COMMUNITY): Payer: Self-pay

## 2023-07-25 DIAGNOSIS — M25562 Pain in left knee: Secondary | ICD-10-CM | POA: Diagnosis not present

## 2023-07-25 DIAGNOSIS — R600 Localized edema: Secondary | ICD-10-CM | POA: Diagnosis not present

## 2023-07-27 DIAGNOSIS — R600 Localized edema: Secondary | ICD-10-CM | POA: Diagnosis not present

## 2023-07-27 DIAGNOSIS — M25562 Pain in left knee: Secondary | ICD-10-CM | POA: Diagnosis not present

## 2023-08-01 NOTE — Telephone Encounter (Signed)
 Pharmacy Patient Advocate Encounter  Received notification from [employer] that Prior Authorization for Wegovy  has been DENIED.  Full denial letter will be uploaded to the media tab. See denial reason below.   PA #/Case ID/Reference #: [id number]

## 2023-08-02 ENCOUNTER — Telehealth: Admitting: Nurse Practitioner

## 2023-08-02 ENCOUNTER — Encounter: Payer: Self-pay | Admitting: Nurse Practitioner

## 2023-08-02 ENCOUNTER — Ambulatory Visit: Admitting: Nurse Practitioner

## 2023-08-02 VITALS — Ht 66.0 in | Wt 217.0 lb

## 2023-08-02 DIAGNOSIS — E669 Obesity, unspecified: Secondary | ICD-10-CM | POA: Diagnosis not present

## 2023-08-02 DIAGNOSIS — Z6835 Body mass index (BMI) 35.0-35.9, adult: Secondary | ICD-10-CM | POA: Diagnosis not present

## 2023-08-02 MED ORDER — WEGOVY 0.5 MG/0.5ML ~~LOC~~ SOAJ: 0.5000 mg | SUBCUTANEOUS | 2 refills | Status: DC

## 2023-08-02 MED ORDER — DULOXETINE HCL 20 MG PO CPEP: 20.0000 mg | ORAL_CAPSULE | Freq: Every day | ORAL | 1 refills | Status: DC

## 2023-08-02 MED ORDER — WEGOVY 0.25 MG/0.5ML ~~LOC~~ SOAJ: 0.2500 mg | SUBCUTANEOUS | 0 refills | Status: DC

## 2023-08-02 NOTE — Assessment & Plan Note (Signed)
 Chronic.  Not well controlled.  Patient has tried diet and exercise for weight loss without success.  Will start [facility] 0.25mg  weekly.  Will increase to [facility] 0.5mg  weekly after the first 4 weeks.  Discussed how to inject medication.  Discussed side effects and benefits of medication.  Follow up in 1 months.  Call sooner if concerns arise.

## 2023-08-02 NOTE — Progress Notes (Deleted)
 LMP 07/30/2015 (Approximate)    Subjective:    Patient ID: Christy Cox, female    DOB: [birth year], 48 y.o.   MRN: [id number]  HPI: Christy Cox is a 48 y.o. female  No chief complaint on file.  WEIGHT GAIN Duration: years Previous attempts at weight loss: yes Complications of obesity:  Peak weight: bartric surgery 3 years ago Weight loss goal: 180lb Weight loss to date: 6 months Requesting obesity pharmacotherapy: yes Current weight loss supplements/medications: no Previous weight loss supplements/meds: yes- has taken phentermine in the past. Calories:       Relevant past medical, surgical, family and social history reviewed and updated as indicated. Interim medical history since our last visit reviewed. Allergies and medications reviewed and updated.  Review of Systems  Constitutional:  Positive for unexpected weight change.    Per HPI unless specifically indicated above     Objective:    LMP 07/30/2015 (Approximate)   Wt Readings from Last 3 Encounters:  06/23/23 202 lb 6.4 oz (91.8 kg)  05/30/23 205 lb 3.2 oz (93.1 kg)  03/22/23 200 lb 12.8 oz (91.1 kg)    Physical Exam Vitals and nursing note reviewed.  Constitutional:      General: She is not in acute distress.    Appearance: Normal appearance. She is obese. She is not ill-appearing, toxic-appearing or diaphoretic.  HENT:     Head: Normocephalic.     Right Ear: External ear normal.     Left Ear: External ear normal.     Nose: Nose normal.     Mouth/Throat:     Mouth: Mucous membranes are moist.     Pharynx: Oropharynx is clear.  Eyes:     General:        Right eye: No discharge.        Left eye: No discharge.     Extraocular Movements: Extraocular movements intact.     Conjunctiva/sclera: Conjunctivae normal.     Pupils: Pupils are equal, round, and reactive to light.  Cardiovascular:     Rate and Rhythm: Normal rate and regular rhythm.     Heart sounds: No murmur heard. Pulmonary:      Effort: Pulmonary effort is normal. No respiratory distress.     Breath sounds: Normal breath sounds. No wheezing or rales.  Musculoskeletal:     Cervical back: Normal range of motion and neck supple.  Skin:    General: Skin is warm and dry.     Capillary Refill: Capillary refill takes less than 2 seconds.  Neurological:     General: No focal deficit present.     Mental Status: She is alert and oriented to person, place, and time. Mental status is at baseline.  Psychiatric:        Mood and Affect: Mood normal.        Behavior: Behavior normal.        Thought Content: Thought content normal.        Judgment: Judgment normal.     Results for orders placed or performed during the hospital encounter of 07/20/23  POCT urinalysis dipstick   Collection Time: 07/20/23  9:08 AM  Result Value Ref Range   Color, UA yellow yellow   Clarity, UA turbid (A) clear   Glucose, UA negative negative mg/dL   Bilirubin, UA negative negative   Ketones, POC UA negative negative mg/dL   Spec Grav, UA 4.098 1.191 - 1.025   Blood, UA negative negative   pH, UA  7.0 5.0 - 8.0   Protein Ur, POC negative negative mg/dL   Urobilinogen, UA 1.0 0.2 or 1.0 E.U./dL   Nitrite, UA Negative Negative   Leukocytes, UA Moderate (2+) (A) Negative  Urine Culture   Collection Time: 07/20/23  9:17 AM   Specimen: Urine, Clean Catch  Result Value Ref Range   Specimen Description URINE, CLEAN CATCH    Special Requests      NONE Performed at [facility], [address] [place], [name] [address]    Culture 70,000 COLONIES/mL ESCHERICHIA COLI (A)    Report Status 07/22/2023 FINAL    Organism ID, Bacteria ESCHERICHIA COLI (A)       Susceptibility   Escherichia coli - MIC*    AMPICILLIN >=32 RESISTANT Resistant     CEFAZOLIN 16 SENSITIVE Sensitive     CEFEPIME <=0.12 SENSITIVE Sensitive     CEFTRIAXONE <=0.25 SENSITIVE Sensitive     CIPROFLOXACIN  <=0.25 SENSITIVE Sensitive     GENTAMICIN <=1 SENSITIVE  Sensitive     IMIPENEM <=0.25 SENSITIVE Sensitive     NITROFURANTOIN <=16 SENSITIVE Sensitive     TRIMETH /SULFA  >=320 RESISTANT Resistant     AMPICILLIN/SULBACTAM >=32 RESISTANT Resistant     PIP/TAZO <=4 SENSITIVE Sensitive ug/mL    * 70,000 COLONIES/mL ESCHERICHIA COLI      Assessment & Plan:   Problem List Items Addressed This Visit   None     Follow up plan: No follow-ups on file.

## 2023-08-02 NOTE — Progress Notes (Signed)
 Ht 5\' 6"  (1.676 m)   Wt 217 lb (98.4 kg)   LMP 07/30/2015 (Approximate)   BMI 35.02 kg/m    Subjective:    Patient ID: Christy Cox, female    DOB: Jun 19, 1975, 48 y.o.   MRN: [id number]  HPI: Christy Cox is a 48 y.o. female  Chief Complaint  Patient presents with   Weight Management Screening   Medication Refill   WEIGHT GAIN Patient states she had knee surgery about 3 weeks ago.  She has fallen a couple of times.  Has gained about 15lbs since our last visit.   Duration: years Previous attempts at weight loss: yes Complications of obesity:  Peak weight: bartric surgery 3 years ago Weight loss goal: 180lb Weight loss to date: 6 months Requesting obesity pharmacotherapy: yes Current weight loss supplements/medications: no Previous weight loss supplements/meds: yes- has taken phentermine in the past. Calories:   Patient has been on Duloxetine through her Rheumatologist.  She doesn't need to see him as often and would like her refills to come from our office.  Clinical coverage for weight loss GLP's   Medication being dispensed is Wegovy  3 mL/28 day. Titration doses are 2 mL/28 days.   [x]  Product being prescribed is FDA approved for the indication, age, weight (if applicable) and not does not exceed dosing limits per the Prescribing Information per the clinical conditions for use.  [x]  Patient's baseline weight measured within the last 45 days as required by provider before dispensing.  [x]  Patient is new to therapy and One of the following:   [x]  The beneficiary is 31 years of age or older with a BMI greater than or equal to 27 kg/m2 AND has established cardiovascular disease (CVD) defined as having a history of myocardial infarction, stroke, or symptomatic peripheral disease, to be documented on the PA form. AND  [x]  The beneficiary is currently on and will continue lifestyle modification including structured nutrition and physical activity, unless physical  activity is not clinically appropriate at the time GLP1 therapy commences AND  []  The beneficiary will NOT be using the requested agent in combination with another GLP-1 receptor agonist agent AND  []  The beneficiary does NOT have any FDA-labeled contraindications to the requested agent, including pregnancy, lactation, history of medullary thyroid  cancer or multiple endocrine neoplasia type II.   Last BMI/Weight/Height recorded Estimated body mass index is 32.13 kg/m as calculated from the following:   Height as of this encounter: 5' 7.01" (1.702 m).   Weight as of this encounter: 205 lb 3.2 oz (93.1 kg).    Relevant past medical, surgical, family and social history reviewed and updated as indicated. Interim medical history since our last visit reviewed. Allergies and medications reviewed and updated.  Review of Systems  Constitutional:  Positive for unexpected weight change.    Per HPI unless specifically indicated above     Objective:    Ht 5\' 6"  (1.676 m)   Wt 217 lb (98.4 kg)   LMP 07/30/2015 (Approximate)   BMI 35.02 kg/m   Wt Readings from Last 3 Encounters:  08/02/23 217 lb (98.4 kg)  06/23/23 202 lb 6.4 oz (91.8 kg)  05/30/23 205 lb 3.2 oz (93.1 kg)    Physical Exam Vitals and nursing note reviewed.  Constitutional:      General: She is not in acute distress.    Appearance: She is not ill-appearing.  HENT:     Head: Normocephalic.     Right Ear: Hearing normal.  Left Ear: Hearing normal.     Nose: Nose normal.  Pulmonary:     Effort: Pulmonary effort is normal. No respiratory distress.  Neurological:     Mental Status: She is alert.  Psychiatric:        Mood and Affect: Mood normal.        Behavior: Behavior normal.        Thought Content: Thought content normal.        Judgment: Judgment normal.     Results for orders placed or performed during the hospital encounter of 07/20/23  POCT urinalysis dipstick   Collection Time: 07/20/23  9:08 AM   Result Value Ref Range   Color, UA yellow yellow   Clarity, UA turbid (A) clear   Glucose, UA negative negative mg/dL   Bilirubin, UA negative negative   Ketones, POC UA negative negative mg/dL   Spec Grav, UA 8.119 1.478 - 1.025   Blood, UA negative negative   pH, UA 7.0 5.0 - 8.0   Protein Ur, POC negative negative mg/dL   Urobilinogen, UA 1.0 0.2 or 1.0 E.U./dL   Nitrite, UA Negative Negative   Leukocytes, UA Moderate (2+) (A) Negative  Urine Culture   Collection Time: 07/20/23  9:17 AM   Specimen: Urine, Clean Catch  Result Value Ref Range   Specimen Description URINE, CLEAN CATCH    Special Requests      NONE Performed at [facility], [address], [place], [name] [address]    Culture 70,000 COLONIES/mL ESCHERICHIA COLI (A)    Report Status 07/22/2023 FINAL    Organism ID, Bacteria ESCHERICHIA COLI (A)       Susceptibility   Escherichia coli - MIC*    AMPICILLIN >=32 RESISTANT Resistant     CEFAZOLIN 16 SENSITIVE Sensitive     CEFEPIME <=0.12 SENSITIVE Sensitive     CEFTRIAXONE <=0.25 SENSITIVE Sensitive     CIPROFLOXACIN  <=0.25 SENSITIVE Sensitive     GENTAMICIN <=1 SENSITIVE Sensitive     IMIPENEM <=0.25 SENSITIVE Sensitive     NITROFURANTOIN <=16 SENSITIVE Sensitive     TRIMETH /SULFA  >=320 RESISTANT Resistant     AMPICILLIN/SULBACTAM >=32 RESISTANT Resistant     PIP/TAZO <=4 SENSITIVE Sensitive ug/mL    * 70,000 COLONIES/mL ESCHERICHIA COLI      Assessment & Plan:   Problem List Items Addressed This Visit       Other   Obesity (BMI 30-39.9) - Primary   Chronic.  Not well controlled.  Patient has tried diet and exercise for weight loss without success.  Will start Wegovy  0.25mg  weekly.  Will increase to Wegovy  0.5mg  weekly after the first 4 weeks.  Discussed how to inject medication.  Discussed side effects and benefits of medication.  Follow up in 1 months.  Call sooner if concerns arise.        Relevant Medications   Semaglutide -Weight  Management (WEGOVY ) 0.25 MG/0.5ML SOAJ   Semaglutide -Weight Management (WEGOVY ) 0.5 MG/0.5ML SOAJ   Other Relevant Orders   Lipoprotein A (LPA)      Follow up plan: Return in about 1 month (around 09/01/2023) for Weight Managment (virtual).    This visit was completed via MyChart due to the restrictions of the COVID-19 pandemic. All issues as above were discussed and addressed. Physical exam was done as above through visual confirmation on MyChart. If it was felt that the patient should be evaluated in the office, they were directed there. The patient verbally consented to this visit. Location  of the patient: Home Location of the provider: Office Those involved with this call:  Provider: Aileen Alexanders, NP CMA: Althia Jetty, CMA Front Desk/Registration: Jaynee Meyer This encounter was conducted via video.  I spent 30 minutes dedicated to the care of this patient on the date of this encounter to include previsit review of plan of care, medications, follow up, face to face time with the patient, and post visit ordering of testing.

## 2023-08-04 ENCOUNTER — Telehealth: Payer: Self-pay

## 2023-08-04 ENCOUNTER — Other Ambulatory Visit (HOSPITAL_COMMUNITY): Payer: Self-pay

## 2023-08-04 NOTE — Telephone Encounter (Signed)
 Pharmacy Patient Advocate Encounter   Received notification from Onbase that prior authorization for Wegovy  0.5MG /0.5ML auto-injectors  is required/requested.   Insurance verification completed.   The patient is insured through [employer] .   Per test claim: PA required; PA started via CoverMyMeds. KEY [id number] . Please see clinical question(s) below that I am not finding the answer to in their chart and advise.

## 2023-08-04 NOTE — Telephone Encounter (Signed)
 This is a no correct?

## 2023-08-07 NOTE — Telephone Encounter (Signed)
 Correct.  I do not have the certification.  When we did the previous PA she was not a BMI of 35.  She has since gained weight and now over 35.

## 2023-08-07 NOTE — Telephone Encounter (Signed)
 Pharmacy Patient Advocate Encounter   Received notification from CoverMyMeds that prior authorization for Wegovy  0.5MG /0.5ML auto-injectors  is required/requested.   Insurance verification completed.   The patient is insured through [employer] .   Per test claim: PA required; PA submitted to above mentioned insurance via CoverMyMeds Key/confirmation #/EOC [id number] Status is pending

## 2023-08-08 ENCOUNTER — Encounter: Payer: Self-pay | Admitting: Nurse Practitioner

## 2023-08-08 ENCOUNTER — Other Ambulatory Visit: Payer: Self-pay | Admitting: Nurse Practitioner

## 2023-08-08 DIAGNOSIS — R300 Dysuria: Secondary | ICD-10-CM

## 2023-08-08 DIAGNOSIS — R600 Localized edema: Secondary | ICD-10-CM | POA: Diagnosis not present

## 2023-08-08 DIAGNOSIS — M25562 Pain in left knee: Secondary | ICD-10-CM | POA: Diagnosis not present

## 2023-08-09 ENCOUNTER — Telehealth: Admitting: Nurse Practitioner

## 2023-08-09 ENCOUNTER — Encounter: Payer: Self-pay | Admitting: Nurse Practitioner

## 2023-08-09 ENCOUNTER — Ambulatory Visit: Payer: Self-pay | Admitting: Nurse Practitioner

## 2023-08-09 ENCOUNTER — Other Ambulatory Visit

## 2023-08-09 VITALS — Ht 66.0 in | Wt 217.0 lb

## 2023-08-09 DIAGNOSIS — E669 Obesity, unspecified: Secondary | ICD-10-CM

## 2023-08-09 DIAGNOSIS — B9689 Other specified bacterial agents as the cause of diseases classified elsewhere: Secondary | ICD-10-CM

## 2023-08-09 DIAGNOSIS — R8290 Unspecified abnormal findings in urine: Secondary | ICD-10-CM

## 2023-08-09 DIAGNOSIS — N760 Acute vaginitis: Secondary | ICD-10-CM

## 2023-08-09 DIAGNOSIS — R300 Dysuria: Secondary | ICD-10-CM | POA: Diagnosis not present

## 2023-08-09 LAB — URINALYSIS, ROUTINE W REFLEX MICROSCOPIC
Bilirubin, UA: NEGATIVE
Glucose, UA: NEGATIVE
Ketones, UA: NEGATIVE
Leukocytes,UA: NEGATIVE
Nitrite, UA: NEGATIVE
Protein,UA: NEGATIVE
RBC, UA: NEGATIVE
Specific Gravity, UA: 1.02 (ref 1.005–1.030)
Urobilinogen, Ur: 0.2 mg/dL (ref 0.2–1.0)
pH, UA: 7 (ref 5.0–7.5)

## 2023-08-09 LAB — WET PREP FOR TRICH, YEAST, CLUE
Clue Cell Exam: POSITIVE — AB
Trichomonas Exam: NEGATIVE
Yeast Exam: NEGATIVE

## 2023-08-09 MED ORDER — METRONIDAZOLE 500 MG PO TABS: 500.0000 mg | ORAL_TABLET | Freq: Two times a day (BID) | ORAL | 0 refills | Status: AC

## 2023-08-09 NOTE — Progress Notes (Signed)
 Ht 5' 6 (1.676 m)   Wt 217 lb (98.4 kg)   LMP 07/30/2015 (Approximate)   BMI 35.02 kg/m    Subjective:    Patient ID: Christy Cox, female    DOB: 29-Feb-1976, 48 y.o.   MRN: [id number]  HPI: Christy Cox is a 48 y.o. female  Chief Complaint  Patient presents with   Urinary Tract Infection    Symptoms started 3 days ago   URINARY SYMPTOMS Symptoms started 3 days ago Dysuria: no Urinary frequency: no Urgency: no Small volume voids: no Symptom severity: no Urinary incontinence: no Foul odor: no Hematuria: no Abdominal pain: no Back pain: no Suprapubic pain/pressure: no Flank pain: no Fever:  no Vomiting: no Relief with cranberry juice: no Relief with pyridium : no Status: stable Previous urinary tract infection: no Recurrent urinary tract infection: no Sexual activity: No sexually active/monogomous/practicing safe sex History of sexually transmitted disease: no Penile discharge: no Treatments attempted: increasing fluids   Relevant past medical, surgical, family and social history reviewed and updated as indicated. Interim medical history since our last visit reviewed. Allergies and medications reviewed and updated.  Review of Systems  Constitutional:  Negative for fever.  Gastrointestinal:  Negative for abdominal pain and vomiting.  Genitourinary:  Positive for frequency and urgency. Negative for decreased urine volume, dysuria, flank pain and hematuria.  Musculoskeletal:  Negative for back pain.    Per HPI unless specifically indicated above     Objective:     Ht 5' 6 (1.676 m)   Wt 217 lb (98.4 kg)   LMP 07/30/2015 (Approximate)   BMI 35.02 kg/m   Wt Readings from Last 3 Encounters:  08/09/23 217 lb (98.4 kg)  08/02/23 217 lb (98.4 kg)  06/23/23 202 lb 6.4 oz (91.8 kg)    Physical Exam Vitals and nursing note reviewed.  HENT:     Head: Normocephalic.     Right Ear: Hearing normal.     Left Ear: Hearing normal.     Nose: Nose normal.   Eyes:     Pupils: Pupils are equal, round, and reactive to light.  Pulmonary:     Effort: Pulmonary effort is normal. No respiratory distress.  Neurological:     Mental Status: She is alert.  Psychiatric:        Mood and Affect: Mood normal.        Behavior: Behavior normal.        Thought Content: Thought content normal.        Judgment: Judgment normal.     Results for orders placed or performed in visit on 08/09/23  WET PREP FOR TRICH, YEAST, CLUE   Collection Time: 08/09/23  8:42 AM   Specimen: Urine   Urine  Result Value Ref Range   Trichomonas Exam Negative Negative   Yeast Exam Negative Negative   Clue Cell Exam Positive (A) Negative  Urinalysis, Routine w reflex microscopic   Collection Time: 08/09/23  8:42 AM  Result Value Ref Range   Specific Gravity, UA 1.020 1.005 - 1.030   pH, UA 7.0 5.0 - 7.5   Color, UA Yellow Yellow   Appearance Ur Clear Clear   Leukocytes,UA Negative Negative   Protein,UA Negative Negative/Trace   Glucose, UA Negative Negative   Ketones, UA Negative Negative   RBC, UA Negative Negative   Bilirubin, UA Negative Negative   Urobilinogen, Ur 0.2 0.2 - 1.0 mg/dL   Nitrite, UA Negative Negative   Microscopic Examination Comment  Assessment & Plan:   Problem List Items Addressed This Visit   None Visit Diagnoses       Bacterial vaginosis    -  Primary   Will treat with Flagyl. Complete course of medication. Follow up if not improved.   Relevant Medications   sulfamethoxazole -trimethoprim  (BACTRIM  DS) 800-160 MG tablet   metroNIDAZOLE (FLAGYL) 500 MG tablet     Abnormal urinalysis       Will send urine out for culture to ensure no bacterial growth. Will treat based on results.   Relevant Orders   Urine Culture        Follow up plan: No follow-ups on file.  This visit was completed via MyChart due to the restrictions of the COVID-19 pandemic. All issues as above were discussed and addressed. Physical exam was done as above  through visual confirmation on MyChart. If it was felt that the patient should be evaluated in the office, they were directed there. The patient verbally consented to this visit. Location of the patient: Home Location of the provider: Office Those involved with this call:  Provider: Aileen Alexanders, NP CMA: Althia Jetty, CMA Front Desk/Registration: Jaynee Meyer This encounter was conducted via video.  I spent 20 mins dedicated to the care of this patient on the date of this encounter to include previsit review of plan of care, follow up and treatment, face to face time with the patient, and post visit ordering of testing.

## 2023-08-10 ENCOUNTER — Encounter: Payer: Self-pay | Admitting: Nurse Practitioner

## 2023-08-10 DIAGNOSIS — M25562 Pain in left knee: Secondary | ICD-10-CM | POA: Diagnosis not present

## 2023-08-10 DIAGNOSIS — R600 Localized edema: Secondary | ICD-10-CM | POA: Diagnosis not present

## 2023-08-10 LAB — LIPOPROTEIN A (LPA): Lipoprotein (a): 124.1 nmol/L — ABNORMAL HIGH (ref <75.0)

## 2023-08-10 MED ORDER — ROSUVASTATIN CALCIUM 5 MG PO TABS: 5.0000 mg | ORAL_TABLET | Freq: Every day | ORAL | 1 refills | Status: DC

## 2023-08-11 ENCOUNTER — Ambulatory Visit: Payer: Self-pay | Admitting: Nurse Practitioner

## 2023-08-11 ENCOUNTER — Other Ambulatory Visit (HOSPITAL_COMMUNITY): Payer: Self-pay

## 2023-08-14 ENCOUNTER — Encounter: Payer: Self-pay | Admitting: Nurse Practitioner

## 2023-08-15 NOTE — Telephone Encounter (Signed)
 PA was denied and routed to clinic previously, please see recent encounter.

## 2023-08-15 NOTE — Telephone Encounter (Signed)
 Pharmacy Patient Advocate Encounter  Received notification from [employer] that Prior Authorization for Wegovy  0.5MG /0.5ML auto-injectors  has been DENIED.  Full denial letter will be uploaded to the media tab. See denial reason below.   PA #/Case ID/Reference #: [id number]

## 2023-08-18 DIAGNOSIS — R350 Frequency of micturition: Secondary | ICD-10-CM | POA: Diagnosis not present

## 2023-08-18 DIAGNOSIS — N3941 Urge incontinence: Secondary | ICD-10-CM | POA: Diagnosis not present

## 2023-08-21 ENCOUNTER — Telehealth: Payer: Self-pay

## 2023-08-21 NOTE — Telephone Encounter (Unsigned)
 Copied from CRM [id number]. Topic: Clinical - Medication Question >> Aug 21, 2023  3:36 PM Myrick T wrote: Reason for CRM: patient said she was returning a call from the practice but not sure who called. Patient said she felt like it was about the Wegovy  and getting the right physician to prescribe. Please f/u with patient

## 2023-08-22 NOTE — Telephone Encounter (Signed)
 Just an FYI, I sent this to her Medicaid yesterday for possible approval with her main [employer] having denied. If you get anything feel free to push to me.

## 2023-08-31 DIAGNOSIS — N319 Neuromuscular dysfunction of bladder, unspecified: Secondary | ICD-10-CM | POA: Diagnosis not present

## 2023-08-31 DIAGNOSIS — R350 Frequency of micturition: Secondary | ICD-10-CM | POA: Diagnosis not present

## 2023-08-31 DIAGNOSIS — N3941 Urge incontinence: Secondary | ICD-10-CM | POA: Diagnosis not present

## 2023-09-05 ENCOUNTER — Telehealth (INDEPENDENT_AMBULATORY_CARE_PROVIDER_SITE_OTHER): Admitting: Nurse Practitioner

## 2023-09-05 ENCOUNTER — Encounter: Payer: Self-pay | Admitting: Nurse Practitioner

## 2023-09-05 VITALS — Wt 217.0 lb

## 2023-09-05 DIAGNOSIS — Z6835 Body mass index (BMI) 35.0-35.9, adult: Secondary | ICD-10-CM

## 2023-09-05 DIAGNOSIS — E669 Obesity, unspecified: Secondary | ICD-10-CM | POA: Diagnosis not present

## 2023-09-05 MED ORDER — PHENTERMINE HCL 15 MG PO CAPS
15.0000 mg | ORAL_CAPSULE | ORAL | 0 refills | Status: DC
Start: 2023-09-05 — End: 2023-10-06

## 2023-09-05 NOTE — Progress Notes (Signed)
 Wt 217 lb (98.4 kg)   LMP 07/30/2015 (Approximate)   BMI 35.02 kg/m    Subjective:    Patient ID: Christy Cox, female    DOB: 1976-01-31, 47 y.o.   MRN: [id number]  HPI: Christy Cox is a 48 y.o. female  Chief Complaint  Patient presents with   Obesity   WEIGHT GAIN Patient states she can't exercise at all.  She  She has fallen a couple of times.  Has continued to gain weight.  Now has to get a knee replacement surgery in September.   Duration: years Previous attempts at weight loss: yes Complications of obesity:  Peak weight: bartric surgery 3 years ago Weight loss goal: 180lb Weight loss to date: 6 months Requesting obesity pharmacotherapy: yes Current weight loss supplements/medications: no Previous weight loss supplements/meds: yes- has taken phentermine  in the past. Calories:   Patient has been on Duloxetine  through her Rheumatologist.  She doesn't need to see him as often and would like her refills to come from our office.   Relevant past medical, surgical, family and social history reviewed and updated as indicated. Interim medical history since our last visit reviewed. Allergies and medications reviewed and updated.  Review of Systems  Constitutional:  Positive for unexpected weight change.    Per HPI unless specifically indicated above     Objective:    Wt 217 lb (98.4 kg)   LMP 07/30/2015 (Approximate)   BMI 35.02 kg/m   Wt Readings from Last 3 Encounters:  09/05/23 217 lb (98.4 kg)  08/09/23 217 lb (98.4 kg)  08/02/23 217 lb (98.4 kg)    Physical Exam Vitals and nursing note reviewed.  Constitutional:      General: She is not in acute distress.    Appearance: She is not ill-appearing.  HENT:     Head: Normocephalic.     Right Ear: Hearing normal.     Left Ear: Hearing normal.     Nose: Nose normal.  Pulmonary:     Effort: Pulmonary effort is normal. No respiratory distress.  Neurological:     Mental Status: She is alert.   Psychiatric:        Mood and Affect: Mood normal.        Behavior: Behavior normal.        Thought Content: Thought content normal.        Judgment: Judgment normal.     Results for orders placed or performed in visit on 08/09/23  Urine Culture   Collection Time: 08/09/23  2:03 PM   Specimen: Urine   UR  Result Value Ref Range   Urine Culture, Routine Final report    Organism ID, Bacteria Comment       Assessment & Plan:   Problem List Items Addressed This Visit       Other   Obesity (BMI 30-39.9) - Primary   Chronic. Not well controlled. Will start Phentermine  due to insurance making patient see Obesity specialist for Wegovy .  She will see [facility] in August/September for her follow up and plans to get it at that time.  However, until then, will start Phentermine  to aid in weight loss.  Side effects and benefits discussed. Follow up in 1 month.  Can increase dose at that time if needed.      Relevant Medications   phentermine  15 MG capsule     Follow up plan: Return in about 1 month (around 10/06/2023) for Weight Managment (virtual).   This visit  was completed via MyChart due to the restrictions of the COVID-19 pandemic. All issues as above were discussed and addressed. Physical exam was done as above through visual confirmation on MyChart. If it was felt that the patient should be evaluated in the office, they were directed there. The patient verbally consented to this visit. Location of the patient: Home Location of the provider: Office Those involved with this call:  Provider: Darice Petty, NP CMA: Gareth Fogo, CMA Front Desk/Registration: Claretta Maiden This encounter was conducted via video.  I spent 30 minutes dedicated to the care of this patient on the date of this encounter to include previsit review of plan of care, medications, referrals and follow up, face to face time with the patient, and post visit ordering of testing.

## 2023-09-05 NOTE — Assessment & Plan Note (Signed)
 Chronic. Not well controlled. Will start Phentermine  due to insurance making patient see Obesity specialist for Wegovy .  She will see [facility] in August/September for her follow up and plans to get it at that time.  However, until then, will start Phentermine  to aid in weight loss.  Side effects and benefits discussed. Follow up in 1 month.  Can increase dose at that time if needed.

## 2023-09-06 DIAGNOSIS — M1712 Unilateral primary osteoarthritis, left knee: Secondary | ICD-10-CM | POA: Diagnosis not present

## 2023-09-14 DIAGNOSIS — N3941 Urge incontinence: Secondary | ICD-10-CM | POA: Diagnosis not present

## 2023-09-14 DIAGNOSIS — R339 Retention of urine, unspecified: Secondary | ICD-10-CM | POA: Diagnosis not present

## 2023-09-14 DIAGNOSIS — M319 Necrotizing vasculopathy, unspecified: Secondary | ICD-10-CM | POA: Diagnosis not present

## 2023-09-14 DIAGNOSIS — N319 Neuromuscular dysfunction of bladder, unspecified: Secondary | ICD-10-CM | POA: Diagnosis not present

## 2023-09-14 DIAGNOSIS — R350 Frequency of micturition: Secondary | ICD-10-CM | POA: Diagnosis not present

## 2023-09-14 DIAGNOSIS — N3281 Overactive bladder: Secondary | ICD-10-CM | POA: Diagnosis not present

## 2023-09-14 DIAGNOSIS — G35 Multiple sclerosis: Secondary | ICD-10-CM | POA: Diagnosis not present

## 2023-09-22 ENCOUNTER — Encounter: Payer: Self-pay | Admitting: Nurse Practitioner

## 2023-10-05 DIAGNOSIS — Z9884 Bariatric surgery status: Secondary | ICD-10-CM | POA: Diagnosis not present

## 2023-10-05 DIAGNOSIS — Z48815 Encounter for surgical aftercare following surgery on the digestive system: Secondary | ICD-10-CM | POA: Diagnosis not present

## 2023-10-05 DIAGNOSIS — Z713 Dietary counseling and surveillance: Secondary | ICD-10-CM | POA: Diagnosis not present

## 2023-10-05 DIAGNOSIS — E66811 Obesity, class 1: Secondary | ICD-10-CM | POA: Diagnosis not present

## 2023-10-05 DIAGNOSIS — Z6834 Body mass index (BMI) 34.0-34.9, adult: Secondary | ICD-10-CM | POA: Diagnosis not present

## 2023-10-06 ENCOUNTER — Telehealth: Admitting: Nurse Practitioner

## 2023-10-06 ENCOUNTER — Encounter: Payer: Self-pay | Admitting: Nurse Practitioner

## 2023-10-06 VITALS — Wt 214.0 lb

## 2023-10-06 DIAGNOSIS — E669 Obesity, unspecified: Secondary | ICD-10-CM | POA: Diagnosis not present

## 2023-10-06 MED ORDER — WEGOVY 0.5 MG/0.5ML ~~LOC~~ SOAJ: 0.5000 mg | SUBCUTANEOUS | 2 refills | Status: AC

## 2023-10-06 NOTE — Assessment & Plan Note (Signed)
 Chronic.  Not well controlled.  Patient has tried diet and exercise for weight loss without success.  Will increase to Wegovy  0.5mg  weekly. Discussed how to inject medication.  Discussed side effects and benefits of medication.  Follow up in 2 months.  Call sooner if concerns arise.

## 2023-10-06 NOTE — Progress Notes (Signed)
 Wt 214 lb (97.1 kg)   LMP 07/30/2015 (Approximate)   BMI 34.54 kg/m    Subjective:    Patient ID: Christy Cox, female    DOB: 03/05/1975, 48 y.o.   MRN: [id number]  HPI: Christy Cox is a 48 y.o. female  Chief Complaint  Patient presents with   Obesity   WEIGHT GAIN Patient states she was able to get the Wegovy .  States she doesn't notice any difference with the medication.  She has done 3 doses of the medication.  She would like increase to the Wegovy  dose.  She is not having any side effects.  Duration: years Previous attempts at weight loss: yes Complications of obesity:  Peak weight: bartric surgery 3 years ago Weight loss goal: 180lb Weight loss to date: 6 months Requesting obesity pharmacotherapy: yes Current weight loss supplements/medications: no Previous weight loss supplements/meds: yes- has taken phentermine  in the past. Calories:   Patient has been on Duloxetine  through her Rheumatologist.  She doesn't need to see him as often and would like her refills to come from our office.   Relevant past medical, surgical, family and social history reviewed and updated as indicated. Interim medical history since our last visit reviewed. Allergies and medications reviewed and updated.  Review of Systems  Constitutional:  Positive for unexpected weight change.    Per HPI unless specifically indicated above     Objective:    Wt 214 lb (97.1 kg)   LMP 07/30/2015 (Approximate)   BMI 34.54 kg/m   Wt Readings from Last 3 Encounters:  10/06/23 214 lb (97.1 kg)  09/05/23 217 lb (98.4 kg)  08/09/23 217 lb (98.4 kg)    Physical Exam Vitals and nursing note reviewed.  Constitutional:      General: She is not in acute distress.    Appearance: She is not ill-appearing.  HENT:     Head: Normocephalic.     Right Ear: Hearing normal.     Left Ear: Hearing normal.     Nose: Nose normal.  Pulmonary:     Effort: Pulmonary effort is normal. No respiratory  distress.  Neurological:     Mental Status: She is alert.  Psychiatric:        Mood and Affect: Mood normal.        Behavior: Behavior normal.        Thought Content: Thought content normal.        Judgment: Judgment normal.     Results for orders placed or performed in visit on 08/09/23  Urine Culture   Collection Time: 08/09/23  2:03 PM   Specimen: Urine   UR  Result Value Ref Range   Urine Culture, Routine Final report    Organism ID, Bacteria Comment       Assessment & Plan:   Problem List Items Addressed This Visit       Other   Obesity (BMI 30-39.9) - Primary   Chronic.  Not well controlled.  Patient has tried diet and exercise for weight loss without success.  Will increase to Wegovy  0.5mg  weekly. Discussed how to inject medication.  Discussed side effects and benefits of medication.  Follow up in 2 months.  Call sooner if concerns arise.         Relevant Medications   semaglutide -weight management (WEGOVY ) 0.5 MG/0.5ML SOAJ SQ injection      Follow up plan: No follow-ups on file.   This visit was completed via MyChart due to the restrictions  of the COVID-19 pandemic. All issues as above were discussed and addressed. Physical exam was done as above through visual confirmation on MyChart. If it was felt that the patient should be evaluated in the office, they were directed there. The patient verbally consented to this visit. Location of the patient: Home Location of the provider: Office Those involved with this call:  Provider: Darice Petty, NP CMA: Gareth Fogo, CMA Front Desk/Registration: Claretta Maiden This encounter was conducted via video.  I spent 30 minutes dedicated to the care of this patient on the date of this encounter to include previsit review of plan of care, medications, referrals and follow up, face to face time with the patient, and post visit ordering of testing.

## 2023-10-09 ENCOUNTER — Encounter: Payer: Self-pay | Admitting: Nurse Practitioner

## 2023-10-11 ENCOUNTER — Other Ambulatory Visit (HOSPITAL_COMMUNITY): Payer: Self-pay

## 2023-10-11 ENCOUNTER — Telehealth: Payer: Self-pay

## 2023-10-11 NOTE — Telephone Encounter (Signed)
 Pharmacy Patient Advocate Encounter   Received notification from [employer] that prior authorization for Wegovy  0.5MG /0.5ML auto-injectors  is required/requested.   Insurance verification completed.   The patient is insured through [employer] .   Per test claim: PA required; PA submitted to above mentioned insurance via Latent Key/confirmation #/EOC [id number] Status is pending

## 2023-10-16 ENCOUNTER — Encounter: Admitting: Nurse Practitioner

## 2023-10-18 DIAGNOSIS — Z9884 Bariatric surgery status: Secondary | ICD-10-CM | POA: Diagnosis not present

## 2023-10-18 DIAGNOSIS — Z48815 Encounter for surgical aftercare following surgery on the digestive system: Secondary | ICD-10-CM | POA: Diagnosis not present

## 2023-10-18 DIAGNOSIS — Z713 Dietary counseling and surveillance: Secondary | ICD-10-CM | POA: Diagnosis not present

## 2023-10-18 DIAGNOSIS — K912 Postsurgical malabsorption, not elsewhere classified: Secondary | ICD-10-CM | POA: Diagnosis not present

## 2023-10-18 NOTE — Telephone Encounter (Signed)
 Patient has already met the criteria and is on the medication.  Please resubmit with the prior information so we can get this approved for the patient.

## 2023-10-18 NOTE — Telephone Encounter (Signed)
 Pharmacy Patient Advocate Encounter  Received notification from [employer] that Prior Authorization for Wegovy  0.5MG /0.5ML auto-injectors  has been DENIED.  Full denial letter will be uploaded to the media tab. See denial reason below.   PA #/Case ID/Reference #: [id number]

## 2023-10-25 NOTE — Telephone Encounter (Signed)
 Per denial letter, prescriber must have American Board of Obesity Medicine Certification, does provider have this certification? Thanks

## 2023-10-25 NOTE — Telephone Encounter (Signed)
 Spoke with patient. Currently [facility] is prescribing her a different dose and so she will have them work on any needed authorization while working with them more directly for this medication.

## 2023-10-26 DIAGNOSIS — Z01818 Encounter for other preprocedural examination: Secondary | ICD-10-CM | POA: Diagnosis not present

## 2023-10-26 DIAGNOSIS — M170 Bilateral primary osteoarthritis of knee: Secondary | ICD-10-CM | POA: Diagnosis not present

## 2023-10-27 DIAGNOSIS — M1712 Unilateral primary osteoarthritis, left knee: Secondary | ICD-10-CM | POA: Diagnosis not present

## 2023-10-27 DIAGNOSIS — Z01812 Encounter for preprocedural laboratory examination: Secondary | ICD-10-CM | POA: Diagnosis not present

## 2023-11-09 DIAGNOSIS — Z6834 Body mass index (BMI) 34.0-34.9, adult: Secondary | ICD-10-CM | POA: Diagnosis not present

## 2023-11-09 DIAGNOSIS — M1712 Unilateral primary osteoarthritis, left knee: Secondary | ICD-10-CM | POA: Diagnosis not present

## 2023-11-09 DIAGNOSIS — E669 Obesity, unspecified: Secondary | ICD-10-CM | POA: Diagnosis not present

## 2023-11-09 DIAGNOSIS — Z7985 Long-term (current) use of injectable non-insulin antidiabetic drugs: Secondary | ICD-10-CM | POA: Diagnosis not present

## 2023-11-09 DIAGNOSIS — G8918 Other acute postprocedural pain: Secondary | ICD-10-CM | POA: Diagnosis not present

## 2023-11-09 DIAGNOSIS — N3281 Overactive bladder: Secondary | ICD-10-CM | POA: Diagnosis not present

## 2023-11-09 DIAGNOSIS — N390 Urinary tract infection, site not specified: Secondary | ICD-10-CM | POA: Diagnosis not present

## 2023-11-10 DIAGNOSIS — N3281 Overactive bladder: Secondary | ICD-10-CM | POA: Diagnosis not present

## 2023-11-10 DIAGNOSIS — N390 Urinary tract infection, site not specified: Secondary | ICD-10-CM | POA: Diagnosis not present

## 2023-11-10 DIAGNOSIS — M1712 Unilateral primary osteoarthritis, left knee: Secondary | ICD-10-CM | POA: Diagnosis not present

## 2023-11-10 DIAGNOSIS — E669 Obesity, unspecified: Secondary | ICD-10-CM | POA: Diagnosis not present

## 2023-11-10 DIAGNOSIS — Z7985 Long-term (current) use of injectable non-insulin antidiabetic drugs: Secondary | ICD-10-CM | POA: Diagnosis not present

## 2023-11-10 DIAGNOSIS — Z6834 Body mass index (BMI) 34.0-34.9, adult: Secondary | ICD-10-CM | POA: Diagnosis not present

## 2023-11-14 DIAGNOSIS — G35 Multiple sclerosis: Secondary | ICD-10-CM | POA: Diagnosis not present

## 2023-11-16 DIAGNOSIS — M25562 Pain in left knee: Secondary | ICD-10-CM | POA: Diagnosis not present

## 2023-11-16 DIAGNOSIS — R600 Localized edema: Secondary | ICD-10-CM | POA: Diagnosis not present

## 2023-11-16 DIAGNOSIS — R2689 Other abnormalities of gait and mobility: Secondary | ICD-10-CM | POA: Diagnosis not present

## 2023-11-20 ENCOUNTER — Other Ambulatory Visit (HOSPITAL_COMMUNITY): Payer: Self-pay

## 2023-11-20 DIAGNOSIS — M25562 Pain in left knee: Secondary | ICD-10-CM | POA: Diagnosis not present

## 2023-11-20 DIAGNOSIS — R2689 Other abnormalities of gait and mobility: Secondary | ICD-10-CM | POA: Diagnosis not present

## 2023-11-20 DIAGNOSIS — R600 Localized edema: Secondary | ICD-10-CM | POA: Diagnosis not present

## 2023-11-21 DIAGNOSIS — Z4889 Encounter for other specified surgical aftercare: Secondary | ICD-10-CM | POA: Diagnosis not present

## 2023-11-22 ENCOUNTER — Encounter: Admitting: Nurse Practitioner

## 2023-11-24 DIAGNOSIS — M25562 Pain in left knee: Secondary | ICD-10-CM | POA: Diagnosis not present

## 2023-11-24 DIAGNOSIS — R2689 Other abnormalities of gait and mobility: Secondary | ICD-10-CM | POA: Diagnosis not present

## 2023-11-24 DIAGNOSIS — R600 Localized edema: Secondary | ICD-10-CM | POA: Diagnosis not present

## 2023-11-30 DIAGNOSIS — R2689 Other abnormalities of gait and mobility: Secondary | ICD-10-CM | POA: Diagnosis not present

## 2023-11-30 DIAGNOSIS — R600 Localized edema: Secondary | ICD-10-CM | POA: Diagnosis not present

## 2023-11-30 DIAGNOSIS — M25562 Pain in left knee: Secondary | ICD-10-CM | POA: Diagnosis not present

## 2023-12-03 DIAGNOSIS — S60222A Contusion of left hand, initial encounter: Secondary | ICD-10-CM | POA: Diagnosis not present

## 2023-12-07 ENCOUNTER — Ambulatory Visit

## 2023-12-07 DIAGNOSIS — R600 Localized edema: Secondary | ICD-10-CM | POA: Diagnosis not present

## 2023-12-07 DIAGNOSIS — M25562 Pain in left knee: Secondary | ICD-10-CM | POA: Diagnosis not present

## 2023-12-07 DIAGNOSIS — R2689 Other abnormalities of gait and mobility: Secondary | ICD-10-CM | POA: Diagnosis not present

## 2023-12-14 DIAGNOSIS — G35B0 Primary progressive multiple sclerosis, unspecified: Secondary | ICD-10-CM | POA: Diagnosis not present

## 2023-12-15 DIAGNOSIS — M5416 Radiculopathy, lumbar region: Secondary | ICD-10-CM | POA: Diagnosis not present

## 2023-12-19 DIAGNOSIS — R2689 Other abnormalities of gait and mobility: Secondary | ICD-10-CM | POA: Diagnosis not present

## 2023-12-19 DIAGNOSIS — R600 Localized edema: Secondary | ICD-10-CM | POA: Diagnosis not present

## 2023-12-19 DIAGNOSIS — M25562 Pain in left knee: Secondary | ICD-10-CM | POA: Diagnosis not present

## 2023-12-21 DIAGNOSIS — R2689 Other abnormalities of gait and mobility: Secondary | ICD-10-CM | POA: Diagnosis not present

## 2023-12-21 DIAGNOSIS — R600 Localized edema: Secondary | ICD-10-CM | POA: Diagnosis not present

## 2023-12-21 DIAGNOSIS — M25562 Pain in left knee: Secondary | ICD-10-CM | POA: Diagnosis not present

## 2023-12-26 DIAGNOSIS — R2689 Other abnormalities of gait and mobility: Secondary | ICD-10-CM | POA: Diagnosis not present

## 2023-12-26 DIAGNOSIS — M25562 Pain in left knee: Secondary | ICD-10-CM | POA: Diagnosis not present

## 2023-12-26 DIAGNOSIS — R600 Localized edema: Secondary | ICD-10-CM | POA: Diagnosis not present

## 2023-12-29 DIAGNOSIS — R600 Localized edema: Secondary | ICD-10-CM | POA: Diagnosis not present

## 2023-12-29 DIAGNOSIS — M25562 Pain in left knee: Secondary | ICD-10-CM | POA: Diagnosis not present

## 2023-12-29 DIAGNOSIS — R2689 Other abnormalities of gait and mobility: Secondary | ICD-10-CM | POA: Diagnosis not present

## 2024-01-01 DIAGNOSIS — R350 Frequency of micturition: Secondary | ICD-10-CM | POA: Diagnosis not present

## 2024-01-01 DIAGNOSIS — N3941 Urge incontinence: Secondary | ICD-10-CM | POA: Diagnosis not present

## 2024-01-01 DIAGNOSIS — R3915 Urgency of urination: Secondary | ICD-10-CM | POA: Diagnosis not present

## 2024-01-02 DIAGNOSIS — R2689 Other abnormalities of gait and mobility: Secondary | ICD-10-CM | POA: Diagnosis not present

## 2024-01-02 DIAGNOSIS — M5416 Radiculopathy, lumbar region: Secondary | ICD-10-CM | POA: Diagnosis not present

## 2024-01-02 DIAGNOSIS — M25562 Pain in left knee: Secondary | ICD-10-CM | POA: Diagnosis not present

## 2024-01-02 DIAGNOSIS — R600 Localized edema: Secondary | ICD-10-CM | POA: Diagnosis not present

## 2024-01-04 DIAGNOSIS — M25562 Pain in left knee: Secondary | ICD-10-CM | POA: Diagnosis not present

## 2024-01-04 DIAGNOSIS — R2689 Other abnormalities of gait and mobility: Secondary | ICD-10-CM | POA: Diagnosis not present

## 2024-01-04 DIAGNOSIS — R600 Localized edema: Secondary | ICD-10-CM | POA: Diagnosis not present

## 2024-01-05 DIAGNOSIS — M5416 Radiculopathy, lumbar region: Secondary | ICD-10-CM | POA: Diagnosis not present

## 2024-01-05 DIAGNOSIS — M48062 Spinal stenosis, lumbar region with neurogenic claudication: Secondary | ICD-10-CM | POA: Diagnosis not present

## 2024-01-11 DIAGNOSIS — R600 Localized edema: Secondary | ICD-10-CM | POA: Diagnosis not present

## 2024-01-11 DIAGNOSIS — R2689 Other abnormalities of gait and mobility: Secondary | ICD-10-CM | POA: Diagnosis not present

## 2024-01-11 DIAGNOSIS — M25562 Pain in left knee: Secondary | ICD-10-CM | POA: Diagnosis not present

## 2024-01-16 DIAGNOSIS — R600 Localized edema: Secondary | ICD-10-CM | POA: Diagnosis not present

## 2024-01-16 DIAGNOSIS — R2689 Other abnormalities of gait and mobility: Secondary | ICD-10-CM | POA: Diagnosis not present

## 2024-01-16 DIAGNOSIS — M25562 Pain in left knee: Secondary | ICD-10-CM | POA: Diagnosis not present

## 2024-01-18 DIAGNOSIS — M25562 Pain in left knee: Secondary | ICD-10-CM | POA: Diagnosis not present

## 2024-01-18 DIAGNOSIS — R2689 Other abnormalities of gait and mobility: Secondary | ICD-10-CM | POA: Diagnosis not present

## 2024-01-18 DIAGNOSIS — R600 Localized edema: Secondary | ICD-10-CM | POA: Diagnosis not present

## 2024-01-24 ENCOUNTER — Other Ambulatory Visit: Payer: Self-pay | Admitting: Family Medicine

## 2024-01-24 ENCOUNTER — Other Ambulatory Visit: Payer: Self-pay | Admitting: Nurse Practitioner

## 2024-01-24 DIAGNOSIS — Z1231 Encounter for screening mammogram for malignant neoplasm of breast: Secondary | ICD-10-CM

## 2024-01-26 NOTE — Telephone Encounter (Signed)
 Requested Prescriptions  Pending Prescriptions Disp Refills   DULoxetine  (CYMBALTA ) 20 MG capsule [Pharmacy Med Name: DULOXETINE  HCL DR 20 MG CAP] 90 capsule 0    Sig: TAKE 1 CAPSULE BY MOUTH EVERY DAY     Psychiatry: Antidepressants - SNRI - duloxetine  Failed - 01/26/2024  3:38 PM      Failed - Cr in normal range and within 360 days    Creatinine, Ser  Date Value Ref Range Status  10/06/2022 0.86 0.57 - 1.00 mg/dL Final         Failed - eGFR is 30 or above and within 360 days    GFR calc Af Amer  Date Value Ref Range Status  12/25/2019 86 >59 mL/min/1.73 Final    Comment:    **In accordance with recommendations from the NKF-ASN Task force,**   [facility] is in the process of updating its eGFR calculation to the   2021 CKD-EPI creatinine equation that estimates kidney function   without a race variable.    GFR calc non Af Amer  Date Value Ref Range Status  12/25/2019 75 >59 mL/min/1.73 Final   eGFR  Date Value Ref Range Status  10/06/2022 84 >59 mL/min/1.73 Final         Passed - Completed PHQ-2 or PHQ-9 in the last 360 days      Passed - Last BP in normal range    BP Readings from Last 1 Encounters:  07/20/23 98/62         Passed - Valid encounter within last 6 months    Recent Outpatient Visits           3 months ago Obesity (BMI 30-39.9)   [facility] Melvin Pao, NP   4 months ago Obesity (BMI 30-39.9)   [place] [facility] Melvin Pao, NP   5 months ago Bacterial vaginosis   [place] [facility] [place], Pao, NP   5 months ago Obesity (BMI 30-39.9)   [place] [facility] Melvin Pao, NP   7 months ago Bilateral swelling of feet   [place] [facility] [place], Pao, NP               rosuvastatin  (CRESTOR ) 5 MG tablet [Pharmacy Med Name: ROSUVASTATIN  CALCIUM  5 MG TAB] 90 tablet 0    Sig: TAKE 1 TABLET (5 MG TOTAL) BY MOUTH DAILY.      Cardiovascular:  Antilipid - Statins 2 Failed - 01/26/2024  3:38 PM      Failed - Cr in normal range and within 360 days    Creatinine, Ser  Date Value Ref Range Status  10/06/2022 0.86 0.57 - 1.00 mg/dL Final         Failed - Lipid Panel in normal range within the last 12 months    Cholesterol, Total  Date Value Ref Range Status  10/06/2022 164 100 - 199 mg/dL Final   LDL Chol Calc (NIH)  Date Value Ref Range Status  10/06/2022 83 0 - 99 mg/dL Final   HDL  Date Value Ref Range Status  10/06/2022 67 >39 mg/dL Final   Triglycerides  Date Value Ref Range Status  10/06/2022 74 0 - 149 mg/dL Final         Passed - Patient is not pregnant      Passed - Valid encounter within last 12 months    Recent Outpatient Visits           3 months ago  Obesity (BMI 30-39.9)   [place] [facility] Melvin Pao, NP   4 months ago Obesity (BMI 30-39.9)   [place] [facility] Melvin Pao, NP   5 months ago Bacterial vaginosis   [place] [facility] [place], Pao, NP   5 months ago Obesity (BMI 30-39.9)   [place] [facility] Melvin Pao, NP   7 months ago Bilateral swelling of feet   [place] [facility] Melvin Pao, NP

## 2024-01-27 DIAGNOSIS — Z96652 Presence of left artificial knee joint: Secondary | ICD-10-CM | POA: Diagnosis not present

## 2024-01-27 DIAGNOSIS — M25562 Pain in left knee: Secondary | ICD-10-CM | POA: Diagnosis not present

## 2024-01-29 ENCOUNTER — Inpatient Hospital Stay: Admission: RE | Admit: 2024-01-29 | Source: Ambulatory Visit

## 2024-02-07 DIAGNOSIS — M5416 Radiculopathy, lumbar region: Secondary | ICD-10-CM | POA: Diagnosis not present

## 2024-02-14 ENCOUNTER — Telehealth

## 2024-02-14 DIAGNOSIS — B029 Zoster without complications: Secondary | ICD-10-CM

## 2024-02-14 MED ORDER — VALACYCLOVIR HCL 1 G PO TABS: 1000.0000 mg | ORAL_TABLET | Freq: Three times a day (TID) | ORAL | 0 refills | Status: AC

## 2024-02-14 MED ORDER — VALACYCLOVIR HCL 1 G PO TABS: 1000.0000 mg | ORAL_TABLET | Freq: Three times a day (TID) | ORAL | 0 refills | Status: DC

## 2024-02-14 MED ORDER — GABAPENTIN 100 MG PO CAPS: 100.0000 mg | ORAL_CAPSULE | Freq: Two times a day (BID) | ORAL | 0 refills | Status: AC

## 2024-02-14 NOTE — Patient Instructions (Addendum)
 Christy Cox, thank you for joining [name], NP for today's virtual visit.  While this provider is not your primary care provider (PCP), if your PCP is located in our provider database this encounter information will be shared with them immediately following your visit.   A [facility] MyChart account gives you access to today's visit and all your visits, tests, and labs performed at [facility]  click here if you don't have a [facility] MyChart account or go to [email or web address]  Consent: (Patient) Christy Cox provided verbal consent for this virtual visit at the beginning of the encounter.  Current Medications:  Current Outpatient Medications:    gabapentin  (NEURONTIN ) 100 MG capsule, Take 1-3 capsules (100-300 mg total) by mouth 2 (two) times daily., Disp: 30 capsule, Rfl: 0   DULoxetine  (CYMBALTA ) 20 MG capsule, TAKE 1 CAPSULE BY MOUTH EVERY DAY, Disp: 90 capsule, Rfl: 0   MYRBETRIQ 50 MG TB24 tablet, Take 50 mg by mouth daily., Disp: , Rfl:    rosuvastatin  (CRESTOR ) 5 MG tablet, TAKE 1 TABLET (5 MG TOTAL) BY MOUTH DAILY., Disp: 90 tablet, Rfl: 0   semaglutide -weight management (WEGOVY ) 0.5 MG/0.5ML SOAJ SQ injection, Inject 0.5 mg into the skin once a week., Disp: 2 mL, Rfl: 2   valACYclovir  (VALTREX ) 1000 MG tablet, Take 1 tablet (1,000 mg total) by mouth 3 (three) times daily for 7 days., Disp: 21 tablet, Rfl: 0 No current facility-administered medications for this visit.  Facility-Administered Medications Ordered in Other Visits:    botulinum toxin Type A  (BOTOX ) injection 100 Units, 100 Units, Intramuscular, Once, Winter, Lonni Righter, MD   Medications ordered in this encounter:  Meds ordered this encounter  Medications   DISCONTD: valACYclovir  (VALTREX ) 1000 MG tablet    Sig: Take 1 tablet (1,000 mg total) by mouth 3 (three) times daily for 7 days.    Dispense:  20 tablet    Refill:  0    Supervising Provider:   LAMPTEY, PHILIP O  [[id number]]   gabapentin  (NEURONTIN ) 100 MG capsule    Sig: Take 1-3 capsules (100-300 mg total) by mouth 2 (two) times daily.    Dispense:  30 capsule    Refill:  0    Supervising Provider:   LAMPTEY, PHILIP O [[id number]]   valACYclovir  (VALTREX ) 1000 MG tablet    Sig: Take 1 tablet (1,000 mg total) by mouth 3 (three) times daily for 7 days.    Dispense:  21 tablet    Refill:  0    Note tablet dose on prior was 20, this is 21 for 1 tablet TID for 7 days    Supervising Provider:   BLAISE ALEENE KIDD [[id number]]     *If you need refills on other medications prior to your next appointment, please contact your pharmacy*  Follow-Up: Call back or seek an in-person evaluation if the symptoms worsen or if the condition fails to improve as anticipated.  [facility] [phone]  Other Instructions Shingles  Shingles, or herpes zoster, is an infection. It gives you a skin rash and blisters. These infected areas may hurt a lot. Shingles only happens if: You've had chickenpox. You've been given a shot called a vaccine to protect you from getting chickenpox. Shingles is rare in this case. What are the causes? Shingles is caused by a germ called the varicella-zoster virus. This is the same germ that causes chickenpox. After you're exposed to the germ, it stays in your body but  is dormant. This means it isn't active. Shingles happens if the germ becomes active again. This can happen years after you're first exposed to the germ. What increases the risk? You may be more likely to get shingles if: You're older than 48 years of age. You're under a lot of stress. You have a weak immune system. The immune system is your body's defense system. It may be weak if: You have human immunodeficiency virus (HIV). You have acquired immunodeficiency syndrome (AIDS). You have cancer. You take medicines that weaken your immune system. These include organ transplant medicines. What are the signs or  symptoms? The first symptoms of shingles may be itching, tingling, or pain. Your skin may feel like it's burning. A few days or weeks later, you'll get a rash. Here's what you can expect: The rash is likely to be on one side of your body. The rash may be shaped like a belt or a band. Over time, it will turn into blisters filled with fluid. The blisters will break open and change into scabs. The scabs will dry up in about 2-3 weeks. You may also have: A fever. Chills. A headache. Nausea. How is this diagnosed? Shingles is diagnosed with a skin exam. A sample called a culture may be taken from one of your blisters and sent to a lab. This will show if you have shingles. How is this treated? The rash may last for several weeks. There's no cure for shingles, but your health care provider may give you medicines. These medicines may: Help with pain. Help with itching. Help with irritation and swelling. Help you get better sooner. Help to prevent long-term problems. If the rash is on your face, you may need to see an eye doctor or an ear, nose, and throat (ENT) doctor. Follow these instructions at home: Medicines Take your medicines only as told by your provider. Put an anti-itch cream or numbing cream on the rash or blisters as told by your provider. Relieving itching and discomfort  To help with itching: Put cold, wet cloths called cold compresses on the rash or blisters. Take a cool bath. Try adding baking soda or dry oatmeal to the water . Do not bathe in hot water . Use calamine lotion on the rash or blisters. You can get this type of lotion at the store. Blister and rash care Keep your rash covered with a loose bandage. Wear loose clothes that don't rub on your rash. Take care of your rash as told by your provider. Make sure you: Wash your hands with soap and water  for at least 20 seconds before and after you change your bandage. If you can't use soap and water , use hand  sanitizer. Keep your rash and blisters clean by washing them with mild soap and cool water . Change your bandage. Check your rash every day for signs of infection. Check for: More redness, swelling, or pain. Fluid or blood. Warmth. Pus or a bad smell. Do not scratch your rash. Do not pick at your blisters. To help you not scratch: Keep your fingernails clean and cut short. Try to wear gloves or mittens when you sleep. General instructions Rest. Wash your hands often with soap and water  for at least 20 seconds. If you can't use soap and water , use hand sanitizer. Washing your hands lowers your chance of getting a skin infection. Your infection can cause chickenpox in others. If you have blisters that aren't scabs yet, stay away from: Babies. Pregnant people. Children who have eczema.  Older people who have organ transplants. People who have a long-term, or chronic, illness. Anyone who hasn't had chickenpox before. Anyone who hasn't gotten the chickenpox vaccine. How is this prevented? Vaccines are the best way to prevent you from getting chickenpox or shingles. Talk with your provider about getting these shots. Where to find more information Centers for Disease Control and Prevention (CDC): tonerpromos.no Contact a health care provider if: Your pain doesn't get better with medicine. Your pain doesn't get better after the rash heals. You have any signs of infection around the rash. Your rash or blisters get worse. You have a fever or chills. Get help right away if: The rash is on your face or nose. You have pain in your face or by your eye. You lose feeling on one side of your face. You have trouble seeing. You have ear pain or ringing in your ear. This information is not intended to replace advice given to you by your health care provider. Make sure you discuss any questions you have with your health care provider. Document Revised: 11/17/2022 Document Reviewed: 04/01/2022 Elsevier  Patient Education  2024 Elsevier Inc.   If you have been instructed to have an in-person evaluation today at a local Urgent Care facility, please use the link below. It will take you to a list of all of our available [facility], including address, phone number and hours of operation. Please do not delay care.  [facility]  If you or a family member do not have a primary care provider, use the link below to schedule a visit and establish care. When you choose a [facility] primary care physician or advanced practice provider, you gain a long-term partner in health. Find a Primary Care Provider  Learn more about [facility] in-office and virtual care options:  - Get Care Now

## 2024-02-14 NOTE — Progress Notes (Signed)
 Virtual Visit Consent   Christy Cox, you are scheduled for a virtual visit with a [facility] provider today. Just as with appointments in the office, your consent must be obtained to participate. Your consent will be active for this visit and any virtual visit you may have with one of our providers in the next 365 days. If you have a MyChart account, a copy of this consent can be sent to you electronically.  As this is a virtual visit, video technology does not allow for your provider to perform a traditional examination. This may limit your provider's ability to fully assess your condition. If your provider identifies any concerns that need to be evaluated in person or the need to arrange testing (such as labs, EKG, etc.), we will make arrangements to do so. Although advances in technology are sophisticated, we cannot ensure that it will always work on either your end or our end. If the connection with a video visit is poor, the visit may have to be switched to a telephone visit. With either a video or telephone visit, we are not always able to ensure that we have a secure connection.  By engaging in this virtual visit, you consent to the provision of healthcare and authorize for your insurance to be billed (if applicable) for the services provided during this visit. Depending on your insurance coverage, you may receive a charge related to this service.  I need to obtain your verbal consent now. Are you willing to proceed with your visit today? Christy Cox has provided verbal consent on 02/14/2024 for a virtual visit (video or telephone). [name], NP  Date: 02/14/2024 1:35 PM   Virtual Visit via Video Note   I, [name], connected with  Christy Cox  ([id number], 11-27-1975) on 02/14/2024 at  1:45 PM EST by a video-enabled telemedicine application and verified that I am speaking with the correct person using two identifiers.  Location: Patient: Virtual Visit Location  Patient: Home Provider: Virtual Visit Location Provider: Home Office   I discussed the limitations of evaluation and management by telemedicine and the availability of in person appointments. The patient expressed understanding and agreed to proceed.    History of Present Illness: Christy Cox is a 48 y.o. who identifies as a female who was assigned female at birth, and is being seen today for painful rash  Onset was Saturday- 5 days ago - tenderness and pain On the right side at bra line Pain level- 2/3 currently Associated symptoms are rash without drainage- looks like it was blisters that have opened up some, mild headache Modifying factors are baby powder on it Denies chest pain, shortness of breath, fevers, chills, URI symptoms, joint pain (outside of prior joint pain), nausea or vomiting, changes in bladder or bowel habits.   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  Resting comfortably  at home.  Head is normocephalic, atraumatic.  No labored breathing.  Speech is clear and coherent with logical content.  Patient is alert and oriented at baseline.  Rash that appears to be several days old- vesicles that have scabbed over- right underarm at bra line  Assessment and Plan:  1. Herpes zoster without complication  (Primary)  - valACYclovir  (VALTREX ) 1000 MG tablet; Take 1 tablet (1,000 mg total) by mouth 3 (three) times daily for 7 days.  Dispense: 21 tablet; Refill: 0 - gabapentin  (NEURONTIN ) 100 MG capsule; Take 1-3 capsules (100-300 mg total) by mouth 2 (two) times daily.  Dispense: 30 capsule; Refill: 0  -keep site clean and dry -avoid rubbing area -take medications as directed -follow up if rash continues with new areas or fails to clear, you develop severe pain, fevers or worsening overall symptoms or new ones -shingles info on AVS  Reviewed side effects, risks and benefits of medication.    Patient acknowledged agreement and understanding of the plan.   Past Medical, Surgical, Social History, Allergies, and Medications have been Reviewed.    Follow Up Instructions: I discussed the assessment and treatment plan with the patient. The patient was provided an opportunity to ask questions and all were answered. The patient agreed with the plan and demonstrated an understanding of the instructions.  A copy of instructions were sent to the patient via MyChart unless otherwise noted below.    The patient was advised to call back or seek an in-person evaluation if the symptoms worsen or if the condition fails to improve as anticipated.    [name], NP     [1]  Allergies Allergen Reactions   Macrodantin [Nitrofurantoin Macrocrystal] Other (See Comments)    Caused elevated liver function and jaundice  [2]  Current Outpatient Medications:    DULoxetine  (CYMBALTA ) 20 MG capsule, TAKE 1 CAPSULE BY MOUTH EVERY DAY, Disp: 90 capsule, Rfl: 0   MYRBETRIQ 50 MG TB24 tablet, Take 50 mg by mouth daily., Disp: , Rfl:    rosuvastatin  (CRESTOR ) 5 MG tablet, TAKE 1 TABLET (5 MG TOTAL) BY MOUTH DAILY., Disp: 90 tablet, Rfl: 0   semaglutide -weight management (WEGOVY ) 0.5 MG/0.5ML SOAJ SQ injection, Inject 0.5 mg into the skin once a week., Disp: 2 mL, Rfl: 2 No current facility-administered  medications for this visit.  Facility-Administered Medications Ordered in Other Visits:    botulinum toxin Type A  (BOTOX ) injection 100 Units, 100 Units, Intramuscular, Once, Winter, Lonni Righter, MD

## 2024-02-15 DIAGNOSIS — R339 Retention of urine, unspecified: Secondary | ICD-10-CM | POA: Diagnosis not present

## 2024-03-19 ENCOUNTER — Other Ambulatory Visit: Payer: Self-pay | Admitting: Medical Genetics

## 2024-03-26 ENCOUNTER — Ambulatory Visit
Admission: RE | Admit: 2024-03-26 | Discharge: 2024-03-26 | Disposition: A | Payer: Self-pay | Source: Ambulatory Visit | Attending: Medical Genetics | Admitting: Medical Genetics

## 2024-03-26 ENCOUNTER — Ambulatory Visit
Admission: RE | Admit: 2024-03-26 | Discharge: 2024-03-26 | Disposition: A | Discharge status: Home / Self Care | Payer: Self-pay | Source: Ambulatory Visit | Attending: Family Medicine | Admitting: Family Medicine

## 2024-03-26 DIAGNOSIS — Z1231 Encounter for screening mammogram for malignant neoplasm of breast: Secondary | ICD-10-CM | POA: Insufficient documentation

## 2024-03-26 DIAGNOSIS — Z006 Encounter for examination for normal comparison and control in clinical research program: Secondary | ICD-10-CM | POA: Insufficient documentation

## 2024-03-27 ENCOUNTER — Ambulatory Visit: Payer: Self-pay | Admitting: Family Medicine

## 2024-05-16 IMAGING — MG MM DIGITAL SCREENING BILAT W/ TOMO AND CAD
8 series · 8 of 24 positions shown · non-contrast
Comparison: Previous exam(s).

CLINICAL DATA: Screening.

EXAM:
DIGITAL SCREENING BILATERAL MAMMOGRAM WITH TOMOSYNTHESIS AND CAD
TECHNIQUE: Bilateral screening digital craniocaudal and mediolateral oblique
mammograms were obtained. Bilateral screening digital breast
tomosynthesis was performed. The images were evaluated with
computer-aided detection.

[L CC synth-2D]
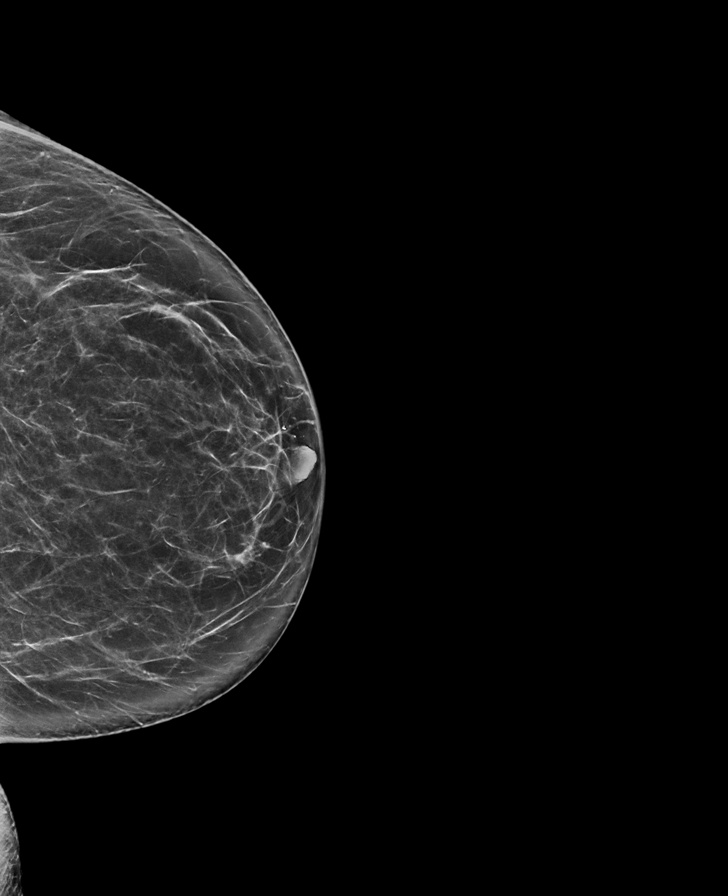

[L MLO synth-2D]
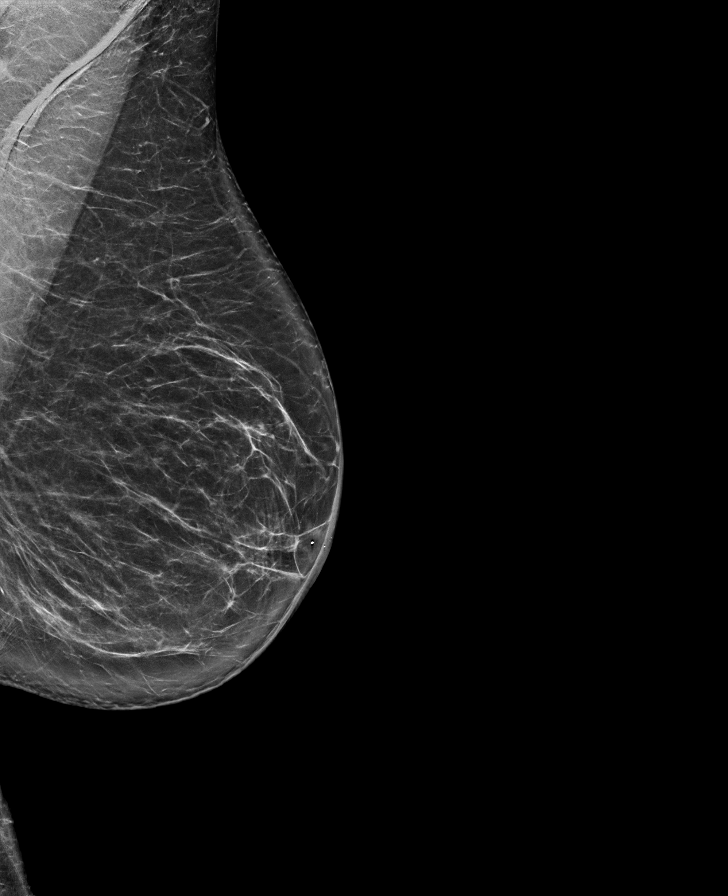

[R CC synth-2D]
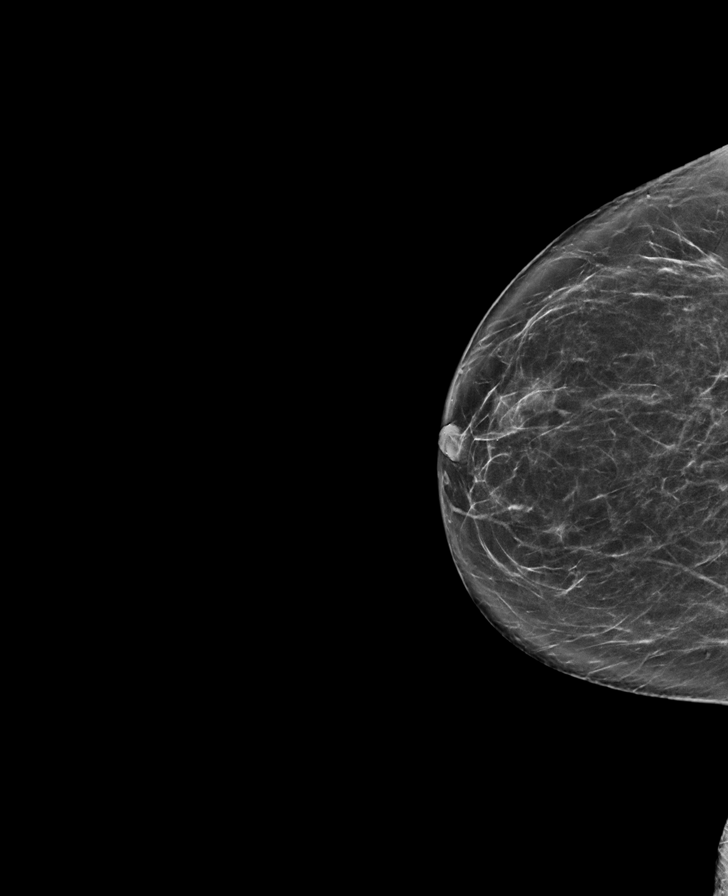

[R MLO synth-2D]
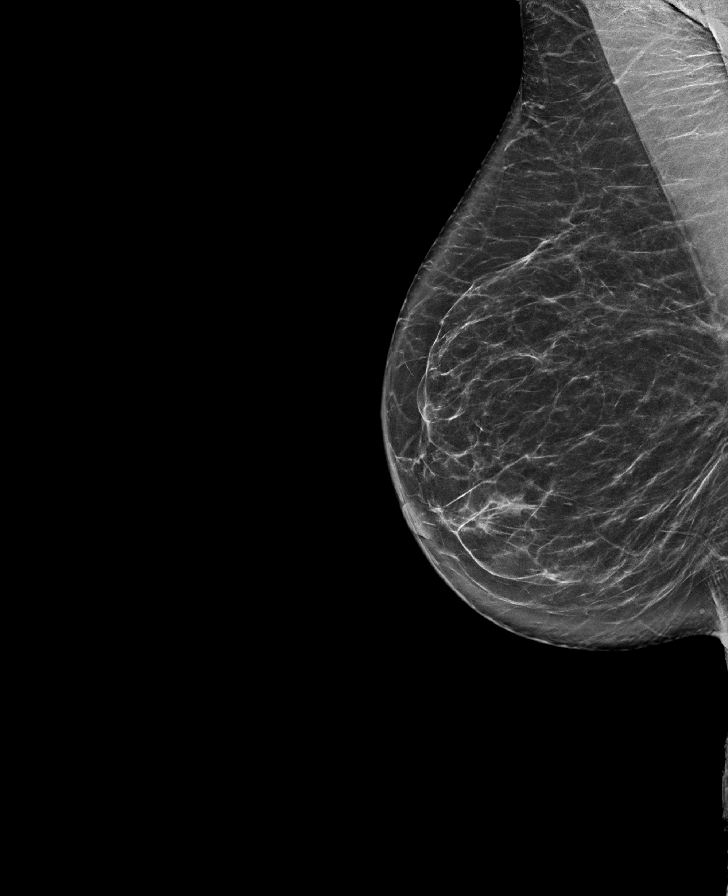

[L MLO tomo · tomo slice 45/88.0]
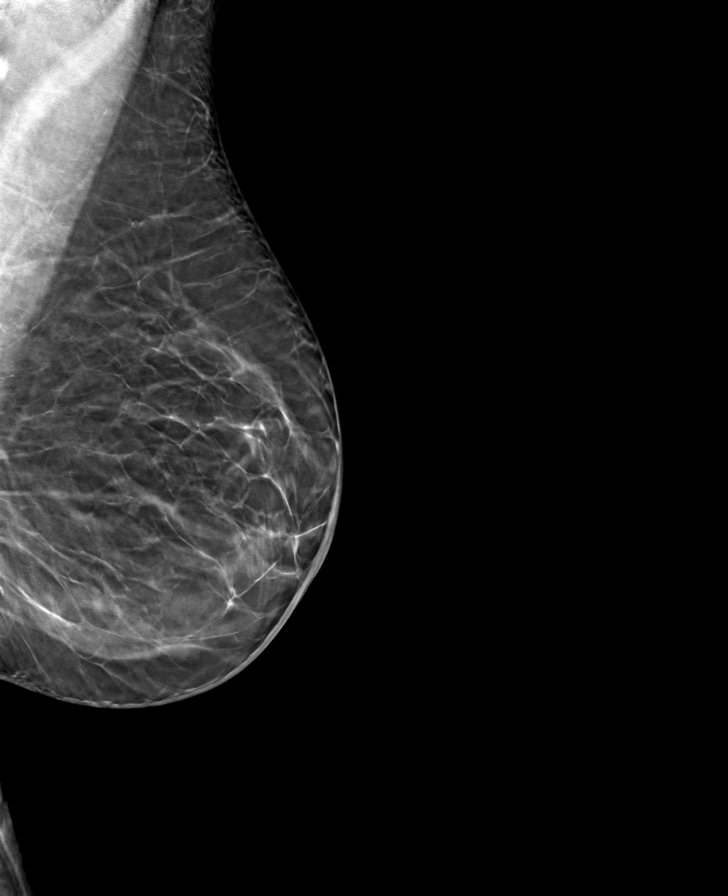

[R MLO tomo · tomo slice 41/81.0]
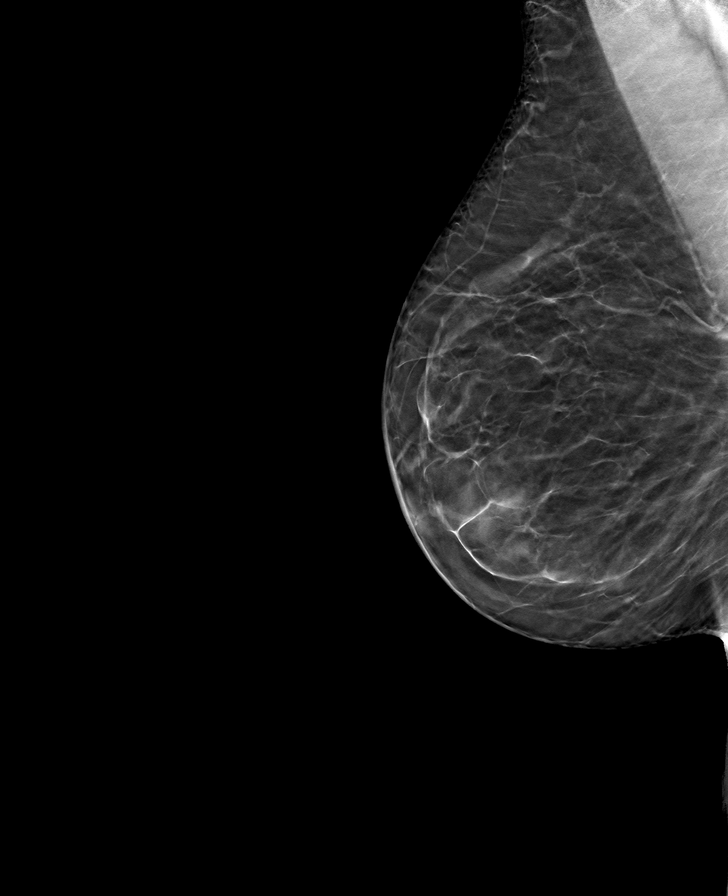

[L CC tomo · tomo slice 41/80.0]
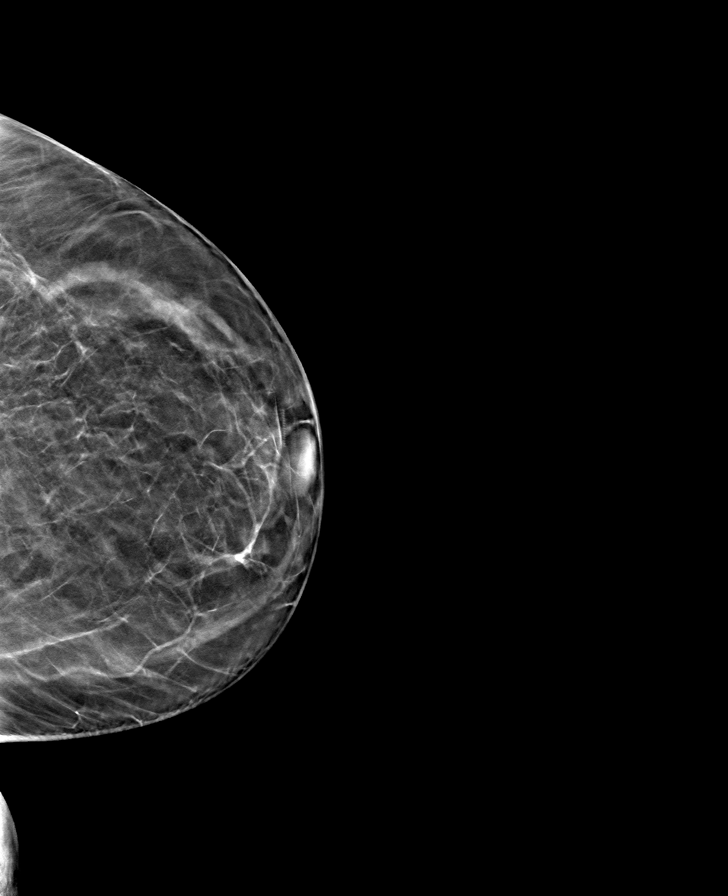

[R CC tomo · tomo slice 38/75.0]
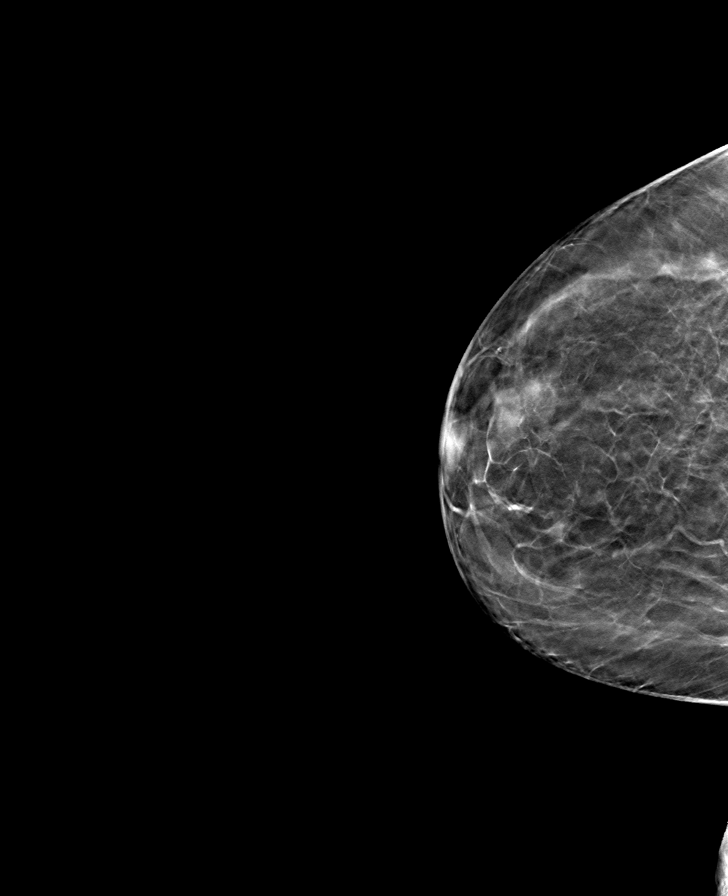

[8 of 24 positions shown; findings below may reference images not displayed]

ACR Breast Density Category b: There are scattered areas of
fibroglandular density.
FINDINGS: There are no findings suspicious for malignancy.
IMPRESSION: No mammographic evidence of malignancy. A result letter of this
screening mammogram will be mailed directly to the patient.

RECOMMENDATION:
Screening mammogram in one year. (Code:51-O-LD2)

BI-RADS CATEGORY  1: Negative.
